# Patient Record
Sex: Male | Born: 1960
Health system: Southern US, Community
[De-identification: ages and names within clinical notes are randomized; demographics above are authoritative.]

## PROBLEM LIST (undated history)

## (undated) DIAGNOSIS — G4730 Sleep apnea, unspecified: Secondary | ICD-10-CM

## (undated) DIAGNOSIS — I10 Essential (primary) hypertension: Secondary | ICD-10-CM

## (undated) DIAGNOSIS — R351 Nocturia: Secondary | ICD-10-CM

## (undated) DIAGNOSIS — G4733 Obstructive sleep apnea (adult) (pediatric): Secondary | ICD-10-CM

## (undated) DIAGNOSIS — M1990 Unspecified osteoarthritis, unspecified site: Secondary | ICD-10-CM

## (undated) DIAGNOSIS — T7840XA Allergy, unspecified, initial encounter: Secondary | ICD-10-CM

## (undated) DIAGNOSIS — E785 Hyperlipidemia, unspecified: Secondary | ICD-10-CM

## (undated) DIAGNOSIS — I714 Abdominal aortic aneurysm, without rupture: Secondary | ICD-10-CM

## (undated) HISTORY — DX: Hyperlipidemia, unspecified: E78.5

## (undated) HISTORY — DX: Allergy, unspecified, initial encounter: T78.40XA

## (undated) HISTORY — PX: FOOT SURGERY: SHX648

## (undated) HISTORY — DX: Essential (primary) hypertension: I10

## (undated) HISTORY — DX: Sleep apnea, unspecified: G47.30

---

## 2000-01-13 HISTORY — PX: FOOT SURGERY: SHX648

## 2008-03-08 ENCOUNTER — Emergency Department (HOSPITAL_COMMUNITY): Admission: EM | Admit: 2008-03-08 | Discharge: 2008-03-09 | Payer: Self-pay | Admitting: Emergency Medicine

## 2008-03-13 ENCOUNTER — Ambulatory Visit: Payer: Self-pay | Admitting: Gastroenterology

## 2008-03-13 DIAGNOSIS — R109 Unspecified abdominal pain: Secondary | ICD-10-CM | POA: Insufficient documentation

## 2008-03-13 DIAGNOSIS — K921 Melena: Secondary | ICD-10-CM | POA: Insufficient documentation

## 2008-03-14 ENCOUNTER — Ambulatory Visit: Payer: Self-pay | Admitting: Gastroenterology

## 2008-03-14 ENCOUNTER — Encounter: Payer: Self-pay | Admitting: Gastroenterology

## 2008-03-14 LAB — HM COLONOSCOPY

## 2008-03-18 ENCOUNTER — Encounter: Payer: Self-pay | Admitting: Gastroenterology

## 2010-02-12 ENCOUNTER — Emergency Department (HOSPITAL_COMMUNITY)
Admission: EM | Admit: 2010-02-12 | Discharge: 2010-02-12 | Disposition: A | Discharge status: Home / Self Care | Payer: Self-pay | Attending: Emergency Medicine | Admitting: Emergency Medicine

## 2010-02-12 DIAGNOSIS — R0982 Postnasal drip: Secondary | ICD-10-CM | POA: Insufficient documentation

## 2010-02-12 DIAGNOSIS — R05 Cough: Secondary | ICD-10-CM | POA: Insufficient documentation

## 2010-02-12 DIAGNOSIS — J3489 Other specified disorders of nose and nasal sinuses: Secondary | ICD-10-CM | POA: Insufficient documentation

## 2010-02-12 DIAGNOSIS — I10 Essential (primary) hypertension: Secondary | ICD-10-CM | POA: Insufficient documentation

## 2010-02-12 DIAGNOSIS — R059 Cough, unspecified: Secondary | ICD-10-CM | POA: Insufficient documentation

## 2010-02-12 DIAGNOSIS — J069 Acute upper respiratory infection, unspecified: Secondary | ICD-10-CM | POA: Insufficient documentation

## 2010-02-12 DIAGNOSIS — J029 Acute pharyngitis, unspecified: Secondary | ICD-10-CM | POA: Insufficient documentation

## 2010-02-17 ENCOUNTER — Emergency Department (HOSPITAL_COMMUNITY)
Admission: EM | Admit: 2010-02-17 | Discharge: 2010-02-17 | Disposition: A | Discharge status: Home / Self Care | Payer: Self-pay | Attending: Emergency Medicine | Admitting: Emergency Medicine

## 2010-02-17 DIAGNOSIS — H9319 Tinnitus, unspecified ear: Secondary | ICD-10-CM | POA: Insufficient documentation

## 2010-02-17 DIAGNOSIS — I10 Essential (primary) hypertension: Secondary | ICD-10-CM | POA: Insufficient documentation

## 2010-02-17 DIAGNOSIS — R5383 Other fatigue: Secondary | ICD-10-CM | POA: Insufficient documentation

## 2010-02-17 DIAGNOSIS — J329 Chronic sinusitis, unspecified: Secondary | ICD-10-CM | POA: Insufficient documentation

## 2010-02-17 DIAGNOSIS — H9209 Otalgia, unspecified ear: Secondary | ICD-10-CM | POA: Insufficient documentation

## 2010-02-17 DIAGNOSIS — J3489 Other specified disorders of nose and nasal sinuses: Secondary | ICD-10-CM | POA: Insufficient documentation

## 2010-02-17 DIAGNOSIS — R059 Cough, unspecified: Secondary | ICD-10-CM | POA: Insufficient documentation

## 2010-02-17 DIAGNOSIS — R5381 Other malaise: Secondary | ICD-10-CM | POA: Insufficient documentation

## 2010-02-17 DIAGNOSIS — R0982 Postnasal drip: Secondary | ICD-10-CM | POA: Insufficient documentation

## 2010-02-17 DIAGNOSIS — R05 Cough: Secondary | ICD-10-CM | POA: Insufficient documentation

## 2010-02-19 ENCOUNTER — Encounter: Payer: Self-pay | Admitting: Internal Medicine

## 2010-02-25 ENCOUNTER — Ambulatory Visit (INDEPENDENT_AMBULATORY_CARE_PROVIDER_SITE_OTHER): Payer: Self-pay | Admitting: Internal Medicine

## 2010-02-25 ENCOUNTER — Encounter: Payer: Self-pay | Admitting: Internal Medicine

## 2010-02-25 DIAGNOSIS — R51 Headache: Secondary | ICD-10-CM

## 2010-02-25 DIAGNOSIS — I10 Essential (primary) hypertension: Secondary | ICD-10-CM

## 2010-02-25 DIAGNOSIS — Z79899 Other long term (current) drug therapy: Secondary | ICD-10-CM

## 2010-02-25 DIAGNOSIS — K219 Gastro-esophageal reflux disease without esophagitis: Secondary | ICD-10-CM

## 2010-02-25 DIAGNOSIS — R519 Headache, unspecified: Secondary | ICD-10-CM | POA: Insufficient documentation

## 2010-02-25 LAB — CBC WITH DIFFERENTIAL/PLATELET
Basophils Relative: 0.3 % (ref 0.0–3.0)
Eosinophils Relative: 1.5 % (ref 0.0–5.0)
Lymphocytes Relative: 25.6 % (ref 12.0–46.0)
Monocytes Absolute: 1 10*3/uL (ref 0.1–1.0)
Monocytes Relative: 7.5 % (ref 3.0–12.0)
Neutrophils Relative %: 65.1 % (ref 43.0–77.0)
Platelets: 261 10*3/uL (ref 150.0–400.0)
RBC: 5.01 Mil/uL (ref 4.22–5.81)
WBC: 13.4 10*3/uL — ABNORMAL HIGH (ref 4.5–10.5)

## 2010-02-25 LAB — BASIC METABOLIC PANEL
BUN: 15 mg/dL (ref 6–23)
Chloride: 102 mEq/L (ref 96–112)
GFR: 166.18 mL/min (ref >60.00)
Glucose, Bld: 71 mg/dL (ref 70–99)
Potassium: 4.3 mEq/L (ref 3.5–5.1)
Sodium: 141 mEq/L (ref 135–145)

## 2010-02-25 MED ORDER — AMLODIPINE BESYLATE 5 MG PO TABS: 5.0000 mg | ORAL_TABLET | Freq: Every day | ORAL | Status: DC

## 2010-02-25 MED ORDER — HYDROCHLOROTHIAZIDE 25 MG PO TABS: 25.0000 mg | ORAL_TABLET | Freq: Every day | ORAL | Status: DC

## 2010-02-25 MED ORDER — LEVOFLOXACIN 500 MG PO TABS: 500.0000 mg | ORAL_TABLET | Freq: Every day | ORAL | Status: DC

## 2010-02-25 NOTE — Assessment & Plan Note (Signed)
Suboptimal control. Presentation not c/w hypertensive urgency. Continue hctz and add norvasc 5mg  qd. Monitor bp daily at home and record for review. Obtain cbc and chem7. Followup closely in one week or sooner if necessary.

## 2010-02-25 NOTE — Assessment & Plan Note (Signed)
Agree sinusitis is a possible contributor to sx's. Intolerant of amoxicillin. Dc amox and begin levaquin. Followup scheduled.

## 2010-02-25 NOTE — Progress Notes (Signed)
  Subjective:    Patient ID: Benjamin Hines, male    DOB: 1960-06-21, 50 y.o.   MRN: [id number]  HPI Pt presents to clinic for evaluation of facial/head pain and elevated BP.  Notes one month h/o predominantly right sided facial pain (right brow and lateral to right side of nose) as well as right crown of head and posterior head pain. Pain is intermittent lasting ~45 mins and occurs at least 5 days at week. Sometime notes tender right temple but no acute visual changes. Seen at [facility] dx'ed with sinsusitis and presribed flonase and amoxicillin. Cannot tolerate amox due to gi upset.  H/o HTN previous tx'ed with norvasc and micardis hct but recently off all medications. Previously noted some degree of ED with medications. UC placed pt on hctz 25mg  qd. BP reviewed elevated and other than HA no associated sx. Also notes almost daily GERD sx not currently being tx'ed.  Reviewed PMH, PSH, medications, allergies, social hx and family hx.    Review of Systems  Constitutional: Positive for fatigue. Negative for fever, chills and activity change.  HENT: Positive for neck pain. Negative for congestion and facial swelling.   Eyes: Negative for pain, discharge, redness and visual disturbance.  Respiratory: Negative for cough, chest tightness and shortness of breath.   Cardiovascular: Negative for chest pain and palpitations.  Gastrointestinal: Negative for nausea, abdominal pain and blood in stool.  Genitourinary: Negative for frequency, decreased urine volume and difficulty urinating.  Musculoskeletal: Negative for back pain, joint swelling and gait problem.  Skin: Negative for color change and rash.  Neurological: Positive for numbness and headaches. Negative for dizziness, seizures and light-headedness.       [Occasional fingertip numbness of right hand. Bilateral UE strength 5/5 Hematological: Negative for adenopathy.  Psychiatric/Behavioral: Negative for behavioral problems and agitation. The patient is  not nervous/anxious.         Objective:   Physical Exam  Constitutional: He appears well-developed. No distress.  HENT:  Head: Normocephalic and atraumatic. Head is without contusion, without right periorbital erythema and without left periorbital erythema. Hair is normal.  Right Ear: External ear normal.  Left Ear: External ear normal.  Nose: Nose normal.  Mouth/Throat: Oropharynx is clear and moist.  Eyes: Conjunctivae are normal. Right eye exhibits no discharge. Left eye exhibits no discharge. No scleral icterus.  Neck: Neck supple. No JVD present. No spinous process tenderness and no muscular tenderness present. No rigidity. Normal range of motion present.  Cardiovascular: Normal rate, regular rhythm and normal heart sounds.  Exam reveals no gallop and no friction rub.   No murmur heard. Pulmonary/Chest: Effort normal and breath sounds normal. No respiratory distress. He has no wheezes. He has no rales.  Musculoskeletal:       Cervical back: He exhibits normal range of motion, no tenderness, no bony tenderness, no swelling and no deformity.       Bilateral UE strength 5/5  Lymphadenopathy:    He has no cervical adenopathy.  Neurological: He is alert. He has normal strength. Gait normal.  Skin: Skin is warm and dry. No bruising, no ecchymosis and no rash noted. He is not diaphoretic. No cyanosis. No pallor. Nails show no clubbing.  Psychiatric: He has a normal mood and affect. His speech is normal and behavior is normal.          Assessment & Plan:

## 2010-02-25 NOTE — Assessment & Plan Note (Signed)
Possible contribution from poorly controlled BP. Obtain ESR given temple tenderness

## 2010-02-25 NOTE — Assessment & Plan Note (Signed)
Begin otc omeprazole 20mg  qd and reassess at next visit.

## 2010-02-26 ENCOUNTER — Telehealth: Payer: Self-pay

## 2010-02-26 NOTE — Telephone Encounter (Signed)
Message copied by Kyung Rudd on Wed Feb 26, 2010  2:49 PM ------      Message from: Letitia Libra, Maisie Fus      Created: Wed Feb 26, 2010  1:25 PM       chem7 nl. Sl elevated wbc possibly related to infxn. Should be taking abx

## 2010-02-26 NOTE — Telephone Encounter (Signed)
Pt aware and verbalized understanding.  

## 2010-03-04 ENCOUNTER — Encounter: Payer: Self-pay | Admitting: Internal Medicine

## 2010-03-04 ENCOUNTER — Ambulatory Visit (INDEPENDENT_AMBULATORY_CARE_PROVIDER_SITE_OTHER): Payer: Self-pay | Admitting: Internal Medicine

## 2010-03-04 DIAGNOSIS — R519 Headache, unspecified: Secondary | ICD-10-CM

## 2010-03-04 DIAGNOSIS — R51 Headache: Secondary | ICD-10-CM

## 2010-03-04 DIAGNOSIS — I10 Essential (primary) hypertension: Secondary | ICD-10-CM

## 2010-03-04 DIAGNOSIS — K219 Gastro-esophageal reflux disease without esophagitis: Secondary | ICD-10-CM

## 2010-03-04 MED ORDER — LEVOFLOXACIN 500 MG PO TABS: 500.0000 mg | ORAL_TABLET | Freq: Every day | ORAL | Status: AC

## 2010-03-04 MED ORDER — AMLODIPINE BESYLATE 10 MG PO TABS: 10.0000 mg | ORAL_TABLET | Freq: Every day | ORAL | Status: DC

## 2010-03-04 MED ORDER — OMEPRAZOLE 40 MG PO CPDR: 40.0000 mg | DELAYED_RELEASE_CAPSULE | Freq: Every day | ORAL | Status: DC

## 2010-03-05 ENCOUNTER — Encounter: Payer: Self-pay | Admitting: Internal Medicine

## 2010-03-05 NOTE — Assessment & Plan Note (Signed)
Improving with abx tx for sinusitis. Extend abx course and followup if no resolution.

## 2010-03-05 NOTE — Progress Notes (Signed)
  Subjective:    Patient ID: Benjamin Hines, male    DOB: Apr 13, 1960, 50 y.o.   MRN: [id number]  HPI Pt presents to clinic for f/u of HTN and facial/head pain. Last visit norvasc 5mg  qd added to hctz with improvement of bp today. Home bp monitor continues to read higher than clinic values. Tolerating norvasc without LE edema. Tolerating levaquin for possible sinusitis and notes 50% improvement of facial and head pain. No f/c or purulent nasal drainage. Attempted prilosec otc qd for GERD sx's but has noted no significant improvement to date.   Reviewed PMH, medications and allergies.  Review of Systems  Constitutional: Negative for fever and chills.  HENT: Positive for congestion. Negative for facial swelling.   Eyes: Negative for pain, discharge and redness.       Objective:   Physical Exam  Constitutional: He appears well-developed and well-nourished. No distress.  HENT:  Head: Normocephalic and atraumatic.  Right Ear: External ear normal.  Left Ear: External ear normal.  Eyes: Conjunctivae are normal. No scleral icterus.  Neurological: He is alert.  Skin: Skin is warm and dry. He is not diaphoretic.          Assessment & Plan:

## 2010-03-05 NOTE — Assessment & Plan Note (Signed)
Improved but suboptimal control. Increase norvasc 10mg  po qd. Monitor bp as outpt and followup in clinic as scheduled. Bring bp monitor to next appt.

## 2010-03-05 NOTE — Assessment & Plan Note (Signed)
Suboptimal control with lose dose PPI. Change to omeprazole 40mg  po qd.

## 2010-03-26 ENCOUNTER — Other Ambulatory Visit (INDEPENDENT_AMBULATORY_CARE_PROVIDER_SITE_OTHER): Payer: Self-pay

## 2010-03-26 DIAGNOSIS — Z0000 Encounter for general adult medical examination without abnormal findings: Secondary | ICD-10-CM

## 2010-03-26 DIAGNOSIS — E785 Hyperlipidemia, unspecified: Secondary | ICD-10-CM

## 2010-03-26 LAB — CBC WITH DIFFERENTIAL/PLATELET
Basophils Relative: 0.3 % (ref 0.0–3.0)
Eosinophils Absolute: 0.1 10*3/uL (ref 0.0–0.7)
Eosinophils Relative: 1.1 % (ref 0.0–5.0)
HCT: 42.1 % (ref 39.0–52.0)
Lymphs Abs: 3.4 10*3/uL (ref 0.7–4.0)
MCHC: 33.5 g/dL (ref 30.0–36.0)
MCV: 89.9 fl (ref 78.0–100.0)
Monocytes Absolute: 0.7 10*3/uL (ref 0.1–1.0)
RBC: 4.68 Mil/uL (ref 4.22–5.81)
WBC: 11 10*3/uL — ABNORMAL HIGH (ref 4.5–10.5)

## 2010-03-26 LAB — BASIC METABOLIC PANEL
BUN: 13 mg/dL (ref 6–23)
CO2: 26 mEq/L (ref 19–32)
Chloride: 103 mEq/L (ref 96–112)
Creatinine, Ser: 0.8 mg/dL (ref 0.4–1.5)
Potassium: 3.9 mEq/L (ref 3.5–5.1)

## 2010-03-26 LAB — LIPID PANEL
Cholesterol: 205 mg/dL — ABNORMAL HIGH (ref 0–200)
HDL: 41.3 mg/dL (ref >39.00)
Triglycerides: 126 mg/dL (ref 0.0–149.0)

## 2010-03-26 LAB — LDL CHOLESTEROL, DIRECT: Direct LDL: 137.6 mg/dL

## 2010-03-26 LAB — POCT URINALYSIS DIPSTICK
Bilirubin, UA: NEGATIVE
Blood, UA: NEGATIVE
Ketones, UA: NEGATIVE
Nitrite, UA: NEGATIVE
Spec Grav, UA: 1.02
pH, UA: 7

## 2010-03-26 LAB — HEPATIC FUNCTION PANEL
ALT: 19 U/L (ref 0–53)
Albumin: 4.2 g/dL (ref 3.5–5.2)
Total Protein: 6.9 g/dL (ref 6.0–8.3)

## 2010-04-04 ENCOUNTER — Ambulatory Visit (INDEPENDENT_AMBULATORY_CARE_PROVIDER_SITE_OTHER): Payer: Self-pay | Admitting: Internal Medicine

## 2010-04-04 ENCOUNTER — Encounter: Payer: Self-pay | Admitting: Internal Medicine

## 2010-04-04 VITALS — BP 150/104 | HR 101 | Temp 98.2°F | Wt 179.0 lb

## 2010-04-04 DIAGNOSIS — Z0000 Encounter for general adult medical examination without abnormal findings: Secondary | ICD-10-CM

## 2010-04-04 DIAGNOSIS — I10 Essential (primary) hypertension: Secondary | ICD-10-CM

## 2010-04-04 DIAGNOSIS — F17200 Nicotine dependence, unspecified, uncomplicated: Secondary | ICD-10-CM

## 2010-04-04 MED ORDER — VARENICLINE TARTRATE 0.5 MG PO TABS: 0.5000 mg | ORAL_TABLET | Freq: Two times a day (BID) | ORAL | Status: AC

## 2010-04-04 MED ORDER — LOSARTAN POTASSIUM-HCTZ 50-12.5 MG PO TABS: 1.0000 | ORAL_TABLET | Freq: Every day | ORAL | Status: DC

## 2010-04-06 DIAGNOSIS — F17200 Nicotine dependence, unspecified, uncomplicated: Secondary | ICD-10-CM | POA: Insufficient documentation

## 2010-04-06 DIAGNOSIS — Z0000 Encounter for general adult medical examination without abnormal findings: Secondary | ICD-10-CM | POA: Insufficient documentation

## 2010-04-06 NOTE — Progress Notes (Signed)
  Subjective:    Patient ID: Benjamin Hines, male    DOB: [birth year], 50 y.o.   MRN: [id number]  HPI Pt presents to clinic for CPE. BP remains elevated despite addition of norvasc. Tolerating medication without adverse effect. Continues to smoke tobacco but interested in possible cessation assistance. No other complaints. Head pain resolved with tx for sinusitis.  Reviewed pmh, medications and allergies.    Review of Systems  Constitutional: Negative.   HENT: Negative.   Respiratory: Negative.   Cardiovascular: Negative.        Objective:   Physical Exam  Vitals reviewed. Constitutional: He appears well-developed and well-nourished. No distress.  HENT:  Head: Normocephalic and atraumatic.  Right Ear: Tympanic membrane, external ear and ear canal normal.  Left Ear: Tympanic membrane, external ear and ear canal normal.  Nose: Nose normal.  Mouth/Throat: Oropharynx is clear and moist. No oropharyngeal exudate.  Eyes: Conjunctivae and EOM are normal. Pupils are equal, round, and reactive to light. Right eye exhibits no discharge. Left eye exhibits no discharge. No scleral icterus.  Neck: Neck supple.  Cardiovascular: Normal rate, regular rhythm and normal heart sounds.  Exam reveals no gallop and no friction rub.   No murmur heard. Pulmonary/Chest: Effort normal and breath sounds normal. No respiratory distress. He has no wheezes. He has no rales.  Abdominal: Soft. Bowel sounds are normal. He exhibits no distension and no mass. There is no tenderness. There is no rebound and no guarding.  Genitourinary: Rectum normal and prostate normal. Rectal exam shows no mass. Guaiac negative stool.  Lymphadenopathy:    He has no cervical adenopathy.  Neurological: He is alert.  Skin: Skin is warm and dry. No rash noted. He is not diaphoretic. No erythema.  Psychiatric: He has a normal mood and affect.          Assessment & Plan:

## 2010-04-06 NOTE — Assessment & Plan Note (Signed)
Exam nl. Obtain screening labs.

## 2010-04-06 NOTE — Assessment & Plan Note (Signed)
Discussed and recommended need for cessation. Agrees to attempt chantix. Reviewed potential side effects.

## 2010-04-06 NOTE — Assessment & Plan Note (Signed)
Suboptimal control. Change hctz to hyzaar. Monitor bp as outpt and followup in clinic as scheduled.

## 2010-04-29 LAB — DIFFERENTIAL
Eosinophils Absolute: 0.2 10*3/uL (ref 0.0–0.7)
Lymphs Abs: 3.1 10*3/uL (ref 0.7–4.0)
Monocytes Absolute: 0.4 10*3/uL (ref 0.1–1.0)
Monocytes Relative: 3 % (ref 3–12)
Neutrophils Relative %: 68 % (ref 43–77)

## 2010-04-29 LAB — HEPATIC FUNCTION PANEL
ALT: 24 U/L (ref 0–53)
Albumin: 4.1 g/dL (ref 3.5–5.2)
Alkaline Phosphatase: 107 U/L (ref 39–117)
Total Protein: 6.8 g/dL (ref 6.0–8.3)

## 2010-04-29 LAB — BASIC METABOLIC PANEL
Chloride: 104 mEq/L (ref 96–112)
Creatinine, Ser: 0.87 mg/dL (ref 0.4–1.5)
GFR calc Af Amer: >60 mL/min (ref >60)
Potassium: 3.8 mEq/L (ref 3.5–5.1)
Sodium: 139 mEq/L (ref 135–145)

## 2010-04-29 LAB — CBC
Hemoglobin: 14.1 g/dL (ref 13.0–17.0)
MCV: 88.5 fL (ref 78.0–100.0)
RBC: 4.59 MIL/uL (ref 4.22–5.81)
WBC: 11.9 10*3/uL — ABNORMAL HIGH (ref 4.0–10.5)

## 2010-05-05 ENCOUNTER — Ambulatory Visit: Payer: Self-pay | Admitting: Internal Medicine

## 2010-06-03 ENCOUNTER — Ambulatory Visit: Payer: Self-pay | Admitting: Internal Medicine

## 2010-08-01 ENCOUNTER — Ambulatory Visit: Payer: Self-pay | Admitting: Internal Medicine

## 2010-09-03 ENCOUNTER — Ambulatory Visit: Payer: Self-pay | Admitting: Family Medicine

## 2010-09-29 ENCOUNTER — Ambulatory Visit: Payer: Self-pay | Admitting: Family Medicine

## 2010-10-29 ENCOUNTER — Ambulatory Visit: Payer: Self-pay | Admitting: Family Medicine

## 2010-11-10 ENCOUNTER — Ambulatory Visit: Payer: Self-pay | Admitting: Family Medicine

## 2010-12-12 ENCOUNTER — Ambulatory Visit: Payer: Self-pay | Admitting: Family Medicine

## 2011-02-06 ENCOUNTER — Ambulatory Visit: Payer: Self-pay | Admitting: Family Medicine

## 2011-04-01 ENCOUNTER — Ambulatory Visit: Payer: Self-pay | Admitting: Family Medicine

## 2012-09-29 ENCOUNTER — Ambulatory Visit (INDEPENDENT_AMBULATORY_CARE_PROVIDER_SITE_OTHER)
Admission: RE | Admit: 2012-09-29 | Discharge: 2012-09-29 | Disposition: A | Discharge status: Home / Self Care | Payer: BC Managed Care – PPO | Source: Ambulatory Visit | Attending: Family Medicine | Admitting: Family Medicine

## 2012-09-29 ENCOUNTER — Encounter: Payer: Self-pay | Admitting: Family Medicine

## 2012-09-29 ENCOUNTER — Ambulatory Visit (INDEPENDENT_AMBULATORY_CARE_PROVIDER_SITE_OTHER): Payer: BC Managed Care – PPO | Admitting: Family Medicine

## 2012-09-29 VITALS — BP 144/79 | HR 79 | Temp 97.8°F | Ht 69.0 in | Wt 164.0 lb

## 2012-09-29 DIAGNOSIS — R059 Cough, unspecified: Secondary | ICD-10-CM

## 2012-09-29 DIAGNOSIS — R05 Cough: Secondary | ICD-10-CM

## 2012-09-29 DIAGNOSIS — R5383 Other fatigue: Secondary | ICD-10-CM

## 2012-09-29 DIAGNOSIS — R631 Polydipsia: Secondary | ICD-10-CM

## 2012-09-29 DIAGNOSIS — R358 Other polyuria: Secondary | ICD-10-CM

## 2012-09-29 DIAGNOSIS — R3589 Other polyuria: Secondary | ICD-10-CM

## 2012-09-29 DIAGNOSIS — R634 Abnormal weight loss: Secondary | ICD-10-CM

## 2012-09-29 DIAGNOSIS — R5381 Other malaise: Secondary | ICD-10-CM

## 2012-09-29 LAB — POCT URINALYSIS DIPSTICK
Bilirubin, UA: NEGATIVE
Ketones, UA: NEGATIVE
Leukocytes, UA: NEGATIVE
Protein, UA: NEGATIVE

## 2012-09-29 LAB — BASIC METABOLIC PANEL WITH GFR
BUN: 10 mg/dL (ref 6–23)
CO2: 25 meq/L (ref 19–32)
Calcium: 9.1 mg/dL (ref 8.4–10.5)
Chloride: 106 meq/L (ref 96–112)
Creatinine, Ser: 0.7 mg/dL (ref 0.4–1.5)
GFR: 151.05 mL/min
Glucose, Bld: 72 mg/dL (ref 70–99)
Potassium: 4.1 meq/L (ref 3.5–5.1)
Sodium: 141 meq/L (ref 135–145)

## 2012-09-29 LAB — CBC WITH DIFFERENTIAL/PLATELET
Basophils Absolute: 0 K/uL (ref 0.0–0.1)
Basophils Relative: 0.4 % (ref 0.0–3.0)
Eosinophils Absolute: 0.2 K/uL (ref 0.0–0.7)
Eosinophils Relative: 1.4 % (ref 0.0–5.0)
HCT: 44 % (ref 39.0–52.0)
Hemoglobin: 14.7 g/dL (ref 13.0–17.0)
Lymphocytes Relative: 31.7 % (ref 12.0–46.0)
Lymphs Abs: 3.3 K/uL (ref 0.7–4.0)
MCHC: 33.5 g/dL (ref 30.0–36.0)
MCV: 89.4 fl (ref 78.0–100.0)
Monocytes Absolute: 0.5 K/uL (ref 0.1–1.0)
Monocytes Relative: 5.2 % (ref 3.0–12.0)
Neutro Abs: 6.4 K/uL (ref 1.4–7.7)
Neutrophils Relative %: 61.3 % (ref 43.0–77.0)
Platelets: 234 K/uL (ref 150.0–400.0)
RBC: 4.93 Mil/uL (ref 4.22–5.81)
RDW: 14.3 % (ref 11.5–14.6)
WBC: 10.4 K/uL (ref 4.5–10.5)

## 2012-09-29 LAB — TSH: TSH: 1.43 u[IU]/mL (ref 0.35–5.50)

## 2012-09-29 LAB — HEPATIC FUNCTION PANEL
ALT: 18 U/L (ref 0–53)
Total Bilirubin: 0.7 mg/dL (ref 0.3–1.2)

## 2012-09-29 NOTE — Progress Notes (Signed)
  Subjective:    Patient ID: Benjamin Hines, male    DOB: August 24, 1960, 52 y.o.   MRN: [id number]  HPI  Patient seen today for evaluation regarding nonspecific symptoms of weight loss and malaise. He states his weight has gone down about 15 pounds in the past year. Noticed increased urinary frequency some thirst. No history of diabetes. He also complained of some vague diffuse intermittent crampy pains lower abdomen. No stool changes. No fevers or chills. No dysuria other than occasional slow stream.  Patient had colonoscopy 2010. PSA 2 years ago normal.  Ongoing cigarette use.  He does complain of some dry cough for the past one month. No increased dyspnea. No hemoptysis. No pleuritic pain.  Past Medical History  Diagnosis Date  . Hypertension    Past Surgical History  Procedure Laterality Date  . Foot surgery      left foot    reports that he has been smoking.  He does not have any smokeless tobacco history on file. He reports that  drinks alcohol. He reports that he does not use illicit drugs. family history is not on file. No Known Allergies   Review of Systems  Constitutional: Positive for appetite change, fatigue and unexpected weight change. Negative for fever and chills.  HENT: Negative for trouble swallowing.   Respiratory: Positive for cough. Negative for shortness of breath and wheezing.   Cardiovascular: Negative for chest pain, palpitations and leg swelling.  Gastrointestinal: Negative for nausea, vomiting, abdominal pain, diarrhea and constipation.  Endocrine: Positive for polydipsia and polyuria.  Genitourinary: Negative for dysuria, hematuria and flank pain.  Skin: Negative for rash.  Neurological: Negative for headaches.  Psychiatric/Behavioral: Negative for confusion.       Objective:   Physical Exam  Constitutional: He appears well-developed and well-nourished.  HENT:  Right Ear: External ear normal.  Left Ear: External ear normal.  Mouth/Throat:  Oropharynx is clear and moist.  Neck: Neck supple. No thyromegaly present.  Cardiovascular: Normal rate and regular rhythm.   Pulmonary/Chest: Effort normal and breath sounds normal. No respiratory distress. He has no wheezes. He has no rales.  Abdominal: Soft. Bowel sounds are normal. He exhibits no distension and no mass. There is no tenderness. There is no rebound and no guarding.  Musculoskeletal: He exhibits no edema.  Lymphadenopathy:    He has no cervical adenopathy.  Skin: No rash noted.          Assessment & Plan:  Patient persists with nonspecific symptoms of malaise, weight loss, polyuria, polydipsia, and dry cough for one month. Rule out diabetes. Obtain screening labs with basic metabolic panel, hepatic panel, CBC, TSH, urine dipstick, chest x-ray

## 2012-10-04 ENCOUNTER — Ambulatory Visit: Payer: BC Managed Care – PPO | Admitting: Family Medicine

## 2012-10-05 ENCOUNTER — Encounter: Payer: Self-pay | Admitting: Family Medicine

## 2012-10-05 ENCOUNTER — Ambulatory Visit (INDEPENDENT_AMBULATORY_CARE_PROVIDER_SITE_OTHER): Payer: BC Managed Care – PPO | Admitting: Family Medicine

## 2012-10-05 VITALS — BP 142/88 | HR 84 | Temp 98.2°F | Wt 166.0 lb

## 2012-10-05 DIAGNOSIS — H6123 Impacted cerumen, bilateral: Secondary | ICD-10-CM

## 2012-10-05 DIAGNOSIS — H6120 Impacted cerumen, unspecified ear: Secondary | ICD-10-CM

## 2012-10-05 DIAGNOSIS — K5900 Constipation, unspecified: Secondary | ICD-10-CM

## 2012-10-05 DIAGNOSIS — N400 Enlarged prostate without lower urinary tract symptoms: Secondary | ICD-10-CM

## 2012-10-05 MED ORDER — TAMSULOSIN HCL 0.4 MG PO CAPS: 0.4000 mg | ORAL_CAPSULE | Freq: Every day | ORAL | Status: DC

## 2012-10-05 NOTE — Patient Instructions (Addendum)
Constipation, Adult Constipation is when a person has fewer than 3 bowel movements a week; has difficulty having a bowel movement; or has stools that are dry, hard, or larger than normal. As people grow older, constipation is more common. If you try to fix constipation with medicines that make you have a bowel movement (laxatives), the problem may get worse. Long-term laxative use may cause the muscles of the colon to become weak. A low-fiber diet, not taking in enough fluids, and taking certain medicines may make constipation worse. CAUSES   Certain medicines, such as antidepressants, pain medicine, iron supplements, antacids, and water pills.   Certain diseases, such as diabetes, irritable bowel syndrome (IBS), thyroid disease, or depression.   Not drinking enough water.   Not eating enough fiber-rich foods.   Stress or travel.  Lack of physical activity or exercise.  Not going to the restroom when there is the urge to have a bowel movement.  Ignoring the urge to have a bowel movement.  Using laxatives too much. SYMPTOMS   Having fewer than 3 bowel movements a week.   Straining to have a bowel movement.   Having hard, dry, or larger than normal stools.   Feeling full or bloated.   Pain in the lower abdomen.  Not feeling relief after having a bowel movement. DIAGNOSIS  Your caregiver will take a medical history and perform a physical exam. Further testing may be done for severe constipation. Some tests may include:   A barium enema X-ray to examine your rectum, colon, and sometimes, your small intestine.  A sigmoidoscopy to examine your lower colon.  A colonoscopy to examine your entire colon. TREATMENT  Treatment will depend on the severity of your constipation and what is causing it. Some dietary treatments include drinking more fluids and eating more fiber-rich foods. Lifestyle treatments may include regular exercise. If these diet and lifestyle recommendations  do not help, your caregiver may recommend taking over-the-counter laxative medicines to help you have bowel movements. Prescription medicines may be prescribed if over-the-counter medicines do not work.  HOME CARE INSTRUCTIONS   Increase dietary fiber in your diet, such as fruits, vegetables, whole grains, and beans. Limit high-fat and processed sugars in your diet, such as [place] fries, hamburgers, cookies, candies, and soda.   A fiber supplement may be added to your diet if you cannot get enough fiber from foods.   Drink enough fluids to keep your urine clear or pale yellow.   Exercise regularly or as directed by your caregiver.   Go to the restroom when you have the urge to go. Do not hold it.  Only take medicines as directed by your caregiver. Do not take other medicines for constipation without talking to your caregiver first. SEEK IMMEDIATE MEDICAL CARE IF:   You have bright red blood in your stool.   Your constipation lasts for more than 4 days or gets worse.   You have abdominal or rectal pain.   You have thin, pencil-like stools.  You have unexplained weight loss. MAKE SURE YOU:   Understand these instructions.  Will watch your condition.  Will get help right away if you are not doing well or get worse. Document Released: 09/27/2003 Document Revised: 03/23/2011 Document Reviewed: 12/02/2010 [facility] Patient Information 2014 [place], [place].  Follow up in 2-3 weeks if urine symptoms not improved.

## 2012-10-05 NOTE — Progress Notes (Signed)
  Subjective:    Patient ID: Benjamin Hines, male    DOB: 09/15/1960, 52 y.o.   MRN: [id number]  HPI Patient seen for followup for multiple issues as below Recent complaints of weight loss. We obtained several labs including chest x-ray these were all unremarkable.  He complains of some constipation.  Has stools most days though these are frequently hard. Generally tries to eat good fiber and fluid intake appears to be adequate. No recent change in stools otherwise. No bloody stools. Previous colonoscopy 2010. Recent TSH normal. No abdominal pain  Wax build up bilaterally. He's noticed a fullness in the ears. No drainage. No ear pain. No dizziness.  Patient complains of slow urine stream. He has some daytime frequency. Minimal moderate nocturia. Sometimes feels he has difficulty emptying bladder fully. Last PSA 2 years ago normal. No anticholinergic use  Past Medical History  Diagnosis Date  . Hypertension    Past Surgical History  Procedure Laterality Date  . Foot surgery      left foot    reports that he has been smoking.  He does not have any smokeless tobacco history on file. He reports that  drinks alcohol. He reports that he does not use illicit drugs. family history is not on file. No Known Allergies    Review of Systems  Constitutional: Negative for fever, chills, appetite change, fatigue and unexpected weight change.  HENT: Negative for ear pain.   Respiratory: Negative for cough and shortness of breath.   Cardiovascular: Negative for chest pain and leg swelling.  Gastrointestinal: Positive for constipation. Negative for nausea, vomiting and abdominal pain.  Endocrine: Negative for polydipsia and polyuria.  Genitourinary: Positive for decreased urine volume and difficulty urinating. Negative for flank pain.  Musculoskeletal: Negative for back pain.  Hematological: Negative for adenopathy.       Objective:   Physical Exam  Constitutional: He appears well-developed  and well-nourished.  HENT:  Mouth/Throat: Oropharynx is clear and moist.  Patient has cerumen in both ear canals. This was removed with curette  Neck: Neck supple.  Cardiovascular: Normal rate.   Pulmonary/Chest: Effort normal and breath sounds normal. No respiratory distress. He has no wheezes. He has no rales.  Genitourinary:  Rectal exam reveals prostate is relatively normal size. Symmetric with no nodules palpated. No rectal mass.          Assessment & Plan:  #1 constipation. We discussed measures to help reduce. Consider stool softener if not responding to fiber and fluids #2 bilateral cerumen impaction. Removed with curette bilaterally #3 urine frequency. Suspect mild BPH. We discussed options. Follow Flomax 0.5 mg each bedtime. Touch base in 2-3 weeks if not improved.  Recommend repeat PSA if symptoms not promptly improving with the above #4 hypertension. Borderline control. Continue close monitoring. We discussed appropriate diet and sodium reduction

## 2012-10-06 ENCOUNTER — Telehealth: Payer: Self-pay | Admitting: Family Medicine

## 2012-10-06 MED ORDER — TAMSULOSIN HCL 0.4 MG PO CAPS: 0.4000 mg | ORAL_CAPSULE | Freq: Every day | ORAL | Status: DC

## 2012-10-06 NOTE — Telephone Encounter (Signed)
RX sent to pharmacy. Pt informed.

## 2012-10-06 NOTE — Telephone Encounter (Signed)
Patient was seen yesterday. [facility] states they never received the Flomax rx - please resend to [facility]. Also, pt thought Dr. Caryl Never was going to send in a stool softener. Please do so if ok. Also, patient requesting call back to know it is done.

## 2012-10-06 NOTE — Telephone Encounter (Signed)
Were you going to send the a stool softener in to the pharmacy?

## 2012-10-06 NOTE — Telephone Encounter (Signed)
Stool softeners are OTC.  Re-send Flomax 0.4 mg one daily #30 with 5 refills

## 2012-12-06 ENCOUNTER — Encounter: Payer: Self-pay | Admitting: Family Medicine

## 2012-12-06 ENCOUNTER — Ambulatory Visit (INDEPENDENT_AMBULATORY_CARE_PROVIDER_SITE_OTHER): Payer: BC Managed Care – PPO | Admitting: Family Medicine

## 2012-12-06 VITALS — BP 180/124 | Temp 98.6°F | Wt 168.0 lb

## 2012-12-06 DIAGNOSIS — J069 Acute upper respiratory infection, unspecified: Secondary | ICD-10-CM

## 2012-12-06 DIAGNOSIS — I10 Essential (primary) hypertension: Secondary | ICD-10-CM

## 2012-12-06 MED ORDER — HYDROCODONE-HOMATROPINE 5-1.5 MG/5ML PO SYRP: 5.0000 mL | ORAL_SOLUTION | Freq: Four times a day (QID) | ORAL | Status: AC | PRN

## 2012-12-06 MED ORDER — AMLODIPINE BESYLATE 10 MG PO TABS: 10.0000 mg | ORAL_TABLET | Freq: Every day | ORAL | Status: DC

## 2012-12-06 NOTE — Progress Notes (Signed)
  Subjective:    Patient ID: Benjamin Hines, male    DOB: 03/30/1960, 52 y.o.   MRN: [id number]  HPI Patient seen for acute visit of nasal congestion for 3-4 days, cough, and left ear pain. No fever. Some increased malaise. Mild body aches. No nausea or vomiting. Denies any sore throat. Left ear fullness and possibly some mild pressure changes but no drainage  Patient's hypertension but apparently is not taking either of his blood pressure medications. He states he simply did not get these refilled last time.  Denies any recent chest pains or dizziness. Not monitoring blood pressures at home. No alcohol use. Has taken some recent non steroidals.  Past Medical History  Diagnosis Date  . Hypertension    Past Surgical History  Procedure Laterality Date  . Foot surgery      left foot    reports that he has been smoking.  He does not have any smokeless tobacco history on file. He reports that he drinks alcohol. He reports that he does not use illicit drugs. family history is not on file. No Known Allergies   Review of Systems  Constitutional: Negative for fatigue.  HENT: Positive for congestion and rhinorrhea.   Eyes: Negative for visual disturbance.  Respiratory: Positive for cough. Negative for chest tightness and shortness of breath.   Cardiovascular: Negative for chest pain, palpitations and leg swelling.  Neurological: Positive for headaches. Negative for dizziness, syncope, weakness and light-headedness.       Objective:   Physical Exam  Constitutional: He is oriented to person, place, and time. He appears well-developed and well-nourished.  HENT:  Right Ear: External ear normal.  Left Ear: External ear normal.  Mouth/Throat: Oropharynx is clear and moist.  Eyes: Pupils are equal, round, and reactive to light.  Neck: Neck supple. No thyromegaly present.  Cardiovascular: Normal rate and regular rhythm.   Pulmonary/Chest: Effort normal and breath sounds normal. No  respiratory distress. He has no wheezes. He has no rales.  Musculoskeletal: He exhibits no edema.  Neurological: He is alert and oriented to person, place, and time.          Assessment & Plan:  #1 viral URI. Reassurance. Avoid decongestants. Avoid nonsteroidals. Hycodan cough syrup for nighttime cough as needed. #2 severe hypertension. Patient currently not taking blood pressure medications. We talked about seriousness of not treating. Avoid nonsteroidals. He does not use alcohol. Avoid decongestants. Start back amlodipine 10 mg once daily. Reassess blood pressure 2 weeks. Reduce sodium intake. Consider additional medications that point if not controlled

## 2012-12-06 NOTE — Patient Instructions (Signed)
Get back on blood pressure medication immediately. Avoid any decongestants and also avoid Advil and Aleve which can raise blood pressure. Tylenol is OK.

## 2012-12-06 NOTE — Progress Notes (Signed)
Pre visit review using our clinic review tool, if applicable. No additional management support is needed unless otherwise documented below in the visit note. 

## 2013-02-06 ENCOUNTER — Telehealth: Payer: Self-pay | Admitting: Family Medicine

## 2013-02-06 ENCOUNTER — Ambulatory Visit: Payer: BC Managed Care – PPO | Admitting: Family Medicine

## 2013-02-06 NOTE — Telephone Encounter (Signed)
Patient Information:  Caller Name: [place]  Phone: [phone]  Patient: Benjamin, Hines  Gender: Male  DOB: [birth year]  Age: 53 Years  PCP: Carolann Littler [facility])  Office Follow Up:  Does the office need to follow up with this patient?: No  Instructions For The Office: N/A  RN Note:  Home care advice and call back information given  Symptoms  Reason For Call & Symptoms: Pt states that he has cold sxs.  Body aches, Afebrile.  Nasal congestion.  Coughing.  Reviewed Health History In EMR: Yes  Reviewed Medications In EMR: Yes  Reviewed Allergies In EMR: Yes  Reviewed Surgeries / Procedures: Yes  Date of Onset of Symptoms: 02/05/2013  Guideline(s) Used:  Influenza - Seasonal  Disposition Per Guideline:   Discuss with PCP and Callback by Nurse Today  Reason For Disposition Reached:   Patient requests antiviral medicine for influenza and flu symptoms present < 48 hours  Advice Given:  Treating the Symptoms of Flu  Fever, Muscle Aches, and Headache: For fever more than 101 F (38.3 C), muscle aches, and headaches, take acetaminophen every 4-6 hours (Adults 650 mg) OR ibuprofen every 6-8 hours (Adults 400-600 mg).  Treating the Symptoms of Flu  Fever, Muscle Aches, and Headache: For fever more than 101 F (38.3 C), muscle aches, and headaches, take acetaminophen every 4-6 hours (Adults 650 mg) OR ibuprofen every 6-8 hours (Adults 400-600 mg).  Hydrate: Drink extra liquids. If the air in your home is dry, use a humidifier.  Call Back If:  Fever lasts more than 3 days  Runny nose lasts more than 10 days  Cough lasts more than 3 weeks  You become short of breath or worse.  Patient Will Follow Care Advice:  YES

## 2013-02-06 NOTE — Telephone Encounter (Signed)
Pt needs office follow up.  He had SEVERE hypertension in November and was supposed to have 2 weeks follow up.  His symptoms sound viral and treatment is symptomatic.

## 2013-02-06 NOTE — Telephone Encounter (Signed)
Last visit 12/06/12

## 2013-02-06 NOTE — Telephone Encounter (Signed)
Pt informed and wants to make appt to be seen in Feb.

## 2013-02-21 ENCOUNTER — Ambulatory Visit: Payer: BC Managed Care – PPO | Admitting: Family Medicine

## 2013-02-23 ENCOUNTER — Ambulatory Visit (INDEPENDENT_AMBULATORY_CARE_PROVIDER_SITE_OTHER): Payer: BC Managed Care – PPO | Admitting: Family Medicine

## 2013-02-23 ENCOUNTER — Encounter: Payer: Self-pay | Admitting: Family Medicine

## 2013-02-23 VITALS — BP 132/90 | HR 90 | Temp 97.9°F | Wt 167.0 lb

## 2013-02-23 DIAGNOSIS — F17200 Nicotine dependence, unspecified, uncomplicated: Secondary | ICD-10-CM

## 2013-02-23 DIAGNOSIS — J309 Allergic rhinitis, unspecified: Secondary | ICD-10-CM

## 2013-02-23 DIAGNOSIS — I10 Essential (primary) hypertension: Secondary | ICD-10-CM

## 2013-02-23 DIAGNOSIS — Z559 Problems related to education and literacy, unspecified: Secondary | ICD-10-CM

## 2013-02-23 MED ORDER — HYDROCHLOROTHIAZIDE 12.5 MG PO CAPS: 12.5000 mg | ORAL_CAPSULE | Freq: Every day | ORAL | Status: DC

## 2013-02-23 NOTE — Progress Notes (Signed)
Pre visit review using our clinic review tool, if applicable. No additional management support is needed unless otherwise documented below in the visit note. 

## 2013-02-23 NOTE — Patient Instructions (Signed)
Schedule complete physical Consider Allegra, claritan, or Zyrtec for allergy symptoms. May use occasional Aleve for arthritis symptoms but avoid regular use.

## 2013-02-23 NOTE — Progress Notes (Signed)
   Subjective:    Patient ID: Benjamin Hines, male    DOB: 11/30/1960, 53 y.o.   MRN: [id number]  Cough Pertinent negatives include no chest pain, headaches, sore throat or shortness of breath.   Patient here for several issues  Hypertension. We added back amlodipine for severe elevated blood pressure several months ago. He is taking this consistently. We had recommended two-week followup he is just now getting back. Denies any edema or headaches. Not monitoring blood pressures. No chest pains. No dizziness.  Recent cold-like symptoms with nasal congestion and cough. Onset 2 weeks ago. Those symptoms are improving. He does complain of some chronic rhinitis prior to his cold and is asking what he can take. He has not tried any antihistamines. He is still smoking but has transitioned to electronic cigarettes and is trying to taper back  He has history of BPH and currently takes Flomax. Symptoms are relatively stable. He has not had a complete physical a few years  Past Medical History  Diagnosis Date  . Hypertension    Past Surgical History  Procedure Laterality Date  . Foot surgery      left foot    reports that he has been smoking.  He does not have any smokeless tobacco history on file. He reports that he drinks alcohol. He reports that he does not use illicit drugs. family history is not on file. No Known Allergies   Review of Systems  Constitutional: Negative for fatigue.  HENT: Positive for congestion. Negative for sore throat.   Eyes: Negative for visual disturbance.  Respiratory: Positive for cough. Negative for chest tightness and shortness of breath.   Cardiovascular: Negative for chest pain, palpitations and leg swelling.  Endocrine: Negative for polydipsia and polyuria.  Neurological: Negative for dizziness, syncope, weakness, light-headedness and headaches.       Objective:   Physical Exam  Constitutional: He appears well-developed and well-nourished.  HENT:    Right Ear: External ear normal.  Left Ear: External ear normal.  Mouth/Throat: Oropharynx is clear and moist.  Neck: Neck supple. No thyromegaly present.  Cardiovascular: Normal rate and regular rhythm.   Pulmonary/Chest: Effort normal and breath sounds normal. No respiratory distress. He has no wheezes. He has no rales.  Musculoskeletal: He exhibits no edema.          Assessment & Plan:  #1 hypertension. Suboptimally controlled. By my reading right arm seated at rest 148/105 and left arm seated 146/100. Add HCTZ 12.5 mg daily and continue amlodipine and reassess blood pressure one month #2 allergic rhinitis. Try plain antihistamine without decongestant such as Allegra or Claritin #3 BPH symptomatically stable on Flomax

## 2013-02-24 ENCOUNTER — Telehealth: Payer: Self-pay | Admitting: Family Medicine

## 2013-02-24 NOTE — Telephone Encounter (Signed)
Relevant patient education mailed to patient.  

## 2013-03-22 ENCOUNTER — Ambulatory Visit (INDEPENDENT_AMBULATORY_CARE_PROVIDER_SITE_OTHER): Payer: BC Managed Care – PPO | Admitting: Family Medicine

## 2013-03-22 ENCOUNTER — Encounter: Payer: Self-pay | Admitting: Family Medicine

## 2013-03-22 ENCOUNTER — Telehealth: Payer: Self-pay | Admitting: Family Medicine

## 2013-03-22 VITALS — BP 162/112 | HR 85 | Wt 167.0 lb

## 2013-03-22 DIAGNOSIS — I10 Essential (primary) hypertension: Secondary | ICD-10-CM

## 2013-03-22 MED ORDER — LOSARTAN POTASSIUM-HCTZ 50-12.5 MG PO TABS: 1.0000 | ORAL_TABLET | Freq: Every day | ORAL | Status: DC

## 2013-03-22 NOTE — Patient Instructions (Signed)
Stop HCTZ. Continue with amlodipine Start Losartan -hctz one daily

## 2013-03-22 NOTE — Telephone Encounter (Signed)
Relevant patient education mailed to patient.  

## 2013-03-22 NOTE — Progress Notes (Signed)
Pre visit review using our clinic review tool, if applicable. No additional management support is needed unless otherwise documented below in the visit note. 

## 2013-03-22 NOTE — Progress Notes (Signed)
   Subjective:    Patient ID: Benjamin Hines, male    DOB: 1960-08-16, 53 y.o.   MRN: [id number]  HPI  Hypertension. Recent poor control. He takes amlodipine 10 mg daily. We added HCTZ 12.5 mg. However, he has been taking is inconsistently. He had some myalgias which he attributed to the HCTZ. No skin rash. Patient has been taking this no inconsistently. No headaches. No dizziness. No chest pains. No alcohol use.  Past Medical History  Diagnosis Date  . Hypertension    Past Surgical History  Procedure Laterality Date  . Foot surgery      left foot    reports that he has been smoking.  He does not have any smokeless tobacco history on file. He reports that he drinks alcohol. He reports that he does not use illicit drugs. family history is not on file. No Known Allergies   Review of Systems  Constitutional: Negative for fatigue and unexpected weight change.  Eyes: Negative for visual disturbance.  Respiratory: Negative for cough, chest tightness and shortness of breath.   Cardiovascular: Negative for chest pain, palpitations and leg swelling.  Neurological: Negative for dizziness, syncope, weakness, light-headedness and headaches.       Objective:   Physical Exam  Constitutional: He appears well-developed and well-nourished.  Cardiovascular: Normal rate.   Pulmonary/Chest: Effort normal and breath sounds normal. No respiratory distress. He has no wheezes. He has no rales.  Musculoskeletal: He exhibits no edema.          Assessment & Plan:  Hypertension. Poorly controlled. Poor compliance with HCTZ. We explained myalgias would be very unlikely. He's not describing any leg cramps. Continue amlodipine 10 mg daily. Start losartan HCTZ 50/12.5 mg 1 daily. Reassess one month.  Be in touch if any further myalgias

## 2013-04-26 ENCOUNTER — Ambulatory Visit (INDEPENDENT_AMBULATORY_CARE_PROVIDER_SITE_OTHER): Payer: BC Managed Care – PPO | Admitting: Family Medicine

## 2013-04-26 ENCOUNTER — Encounter: Payer: Self-pay | Admitting: Family Medicine

## 2013-04-26 VITALS — BP 140/88 | HR 91 | Temp 98.3°F | Ht 69.0 in | Wt 168.0 lb

## 2013-04-26 DIAGNOSIS — Z23 Encounter for immunization: Secondary | ICD-10-CM

## 2013-04-26 DIAGNOSIS — Z0000 Encounter for general adult medical examination without abnormal findings: Secondary | ICD-10-CM

## 2013-04-26 LAB — LIPID PANEL
CHOL/HDL RATIO: 4
Cholesterol: 179 mg/dL (ref 0–200)
HDL: 40.3 mg/dL (ref >39.00)
LDL Cholesterol: 121 mg/dL — ABNORMAL HIGH (ref 0–99)
TRIGLYCERIDES: 88 mg/dL (ref 0.0–149.0)
VLDL: 17.6 mg/dL (ref 0.0–40.0)

## 2013-04-26 LAB — CBC WITH DIFFERENTIAL/PLATELET
BASOS PCT: 0.3 % (ref 0.0–3.0)
Basophils Absolute: 0 10*3/uL (ref 0.0–0.1)
EOS PCT: 1.1 % (ref 0.0–5.0)
Eosinophils Absolute: 0.1 10*3/uL (ref 0.0–0.7)
HEMATOCRIT: 41.8 % (ref 39.0–52.0)
HEMOGLOBIN: 13.8 g/dL (ref 13.0–17.0)
LYMPHS ABS: 2.9 10*3/uL (ref 0.7–4.0)
Lymphocytes Relative: 26.1 % (ref 12.0–46.0)
MCHC: 33 g/dL (ref 30.0–36.0)
MCV: 89.6 fl (ref 78.0–100.0)
MONOS PCT: 5.2 % (ref 3.0–12.0)
Monocytes Absolute: 0.6 10*3/uL (ref 0.1–1.0)
NEUTROS ABS: 7.5 10*3/uL (ref 1.4–7.7)
Neutrophils Relative %: 67.3 % (ref 43.0–77.0)
Platelets: 255 10*3/uL (ref 150.0–400.0)
RBC: 4.66 Mil/uL (ref 4.22–5.81)
RDW: 14.5 % (ref 11.5–14.6)
WBC: 11.1 10*3/uL — AB (ref 4.5–10.5)

## 2013-04-26 LAB — POCT URINALYSIS DIPSTICK
Bilirubin, UA: NEGATIVE
GLUCOSE UA: NEGATIVE
KETONES UA: NEGATIVE
Leukocytes, UA: NEGATIVE
Nitrite, UA: NEGATIVE
PROTEIN UA: NEGATIVE
RBC UA: NEGATIVE
SPEC GRAV UA: 1.02
UROBILINOGEN UA: 0.2
pH, UA: 6.5

## 2013-04-26 LAB — BASIC METABOLIC PANEL
BUN: 10 mg/dL (ref 6–23)
CALCIUM: 9.3 mg/dL (ref 8.4–10.5)
CO2: 27 meq/L (ref 19–32)
CREATININE: 0.7 mg/dL (ref 0.4–1.5)
Chloride: 105 mEq/L (ref 96–112)
GFR: 145.91 mL/min (ref >60.00)
GLUCOSE: 82 mg/dL (ref 70–99)
Potassium: 4.9 mEq/L (ref 3.5–5.1)
Sodium: 140 mEq/L (ref 135–145)

## 2013-04-26 LAB — HEPATIC FUNCTION PANEL
ALBUMIN: 4 g/dL (ref 3.5–5.2)
ALK PHOS: 102 U/L (ref 39–117)
ALT: 17 U/L (ref 0–53)
AST: 22 U/L (ref 0–37)
BILIRUBIN DIRECT: 0.1 mg/dL (ref 0.0–0.3)
TOTAL PROTEIN: 7 g/dL (ref 6.0–8.3)
Total Bilirubin: 0.6 mg/dL (ref 0.3–1.2)

## 2013-04-26 LAB — TSH: TSH: 1.26 u[IU]/mL (ref 0.35–5.50)

## 2013-04-26 LAB — PSA: PSA: 1.03 ng/mL (ref 0.10–4.00)

## 2013-04-26 MED ORDER — VARENICLINE TARTRATE 1 MG PO TABS: 1.0000 mg | ORAL_TABLET | Freq: Two times a day (BID) | ORAL | Status: DC

## 2013-04-26 MED ORDER — VARENICLINE TARTRATE 0.5 MG X 11 & 1 MG X 42 PO MISC: ORAL | Status: DC

## 2013-04-26 NOTE — Progress Notes (Signed)
   Subjective:    Patient ID: Benjamin Hines, male    DOB: Feb 24, 1960, 53 y.o.   MRN: [id number]  HPI Patient here for complete physical. He has hypertension which has been poorly controlled recently. We have prescribed losartan HCTZ and he never got this filled as he states the pharmacy did not have that medication. He is taking amlodipine regularly and also HCTZ plain 12.5 mg once daily. Last tetanus unknown. He has history of occasional GERD. Ongoing nicotine use approximately one pack cigarettes per day. He does desire to quit.  Family history and social history reviewed and as below: He is married. He has no children of his own but a couple of stepchildren and one grandchild. Works full-time  Past Medical History  Diagnosis Date  . Hypertension    Past Surgical History  Procedure Laterality Date  . Foot surgery      left foot    reports that he has been smoking.  He does not have any smokeless tobacco history on file. He reports that he drinks alcohol. He reports that he does not use illicit drugs. family history is not on file. No Known Allergies    Review of Systems  Constitutional: Positive for fatigue. Negative for fever, activity change and appetite change.  HENT: Negative for congestion, ear pain and trouble swallowing.   Eyes: Negative for pain and visual disturbance.  Respiratory: Negative for cough, shortness of breath and wheezing.   Cardiovascular: Negative for chest pain and palpitations.  Gastrointestinal: Negative for nausea, vomiting, abdominal pain, diarrhea, constipation, blood in stool, abdominal distention and rectal pain.  Endocrine: Negative for polydipsia and polyuria.  Genitourinary: Negative for dysuria, hematuria and testicular pain.  Musculoskeletal: Negative for arthralgias and joint swelling.  Skin: Negative for rash.  Neurological: Negative for dizziness, syncope and headaches.  Hematological: Negative for adenopathy.  Psychiatric/Behavioral:  Negative for confusion and dysphoric mood.       Objective:   Physical Exam  Constitutional: He is oriented to person, place, and time. He appears well-developed and well-nourished. No distress.  HENT:  Head: Normocephalic and atraumatic.  Right Ear: External ear normal.  Left Ear: External ear normal.  Mouth/Throat: Oropharynx is clear and moist.  Eyes: Conjunctivae and EOM are normal. Pupils are equal, round, and reactive to light.  Neck: Normal range of motion. Neck supple. No thyromegaly present.  Cardiovascular: Normal rate, regular rhythm and normal heart sounds.   No murmur heard. Pulmonary/Chest: No respiratory distress. He has no wheezes. He has no rales.  Abdominal: Soft. Bowel sounds are normal. He exhibits no distension and no mass. There is no tenderness. There is no rebound and no guarding.  Genitourinary: Rectum normal and prostate normal.  Musculoskeletal: He exhibits no edema.  Lymphadenopathy:    He has no cervical adenopathy.  Neurological: He is alert and oriented to person, place, and time. He displays normal reflexes. No cranial nerve deficit.  Skin: No rash noted.  Psychiatric: He has a normal mood and affect.          Assessment & Plan:  Complete physical. Obtain screening lab work. Tetanus booster given. Colonoscopy up to date. Smoking cessation discussed at some length. Start Chantix after discussing risk and benefits.

## 2013-04-26 NOTE — Progress Notes (Signed)
Pre visit review using our clinic review tool, if applicable. No additional management support is needed unless otherwise documented below in the visit note. 

## 2013-04-26 NOTE — Patient Instructions (Signed)
Smoking Cessation Quitting smoking is important to your health and has many advantages. However, it is not always easy to quit since nicotine is a very addictive drug. Often times, people try 3 times or more before being able to quit. This document explains the best ways for you to prepare to quit smoking. Quitting takes hard work and a lot of effort, but you can do it. ADVANTAGES OF QUITTING SMOKING  You will live longer, feel better, and live better.  Your body will feel the impact of quitting smoking almost immediately.  Within 20 minutes, blood pressure decreases. Your pulse returns to its normal level.  After 8 hours, carbon monoxide levels in the blood return to normal. Your oxygen level increases.  After 24 hours, the chance of having a heart attack starts to decrease. Your breath, hair, and body stop smelling like smoke.  After 48 hours, damaged nerve endings begin to recover. Your sense of taste and smell improve.  After 72 hours, the body is virtually free of nicotine. Your bronchial tubes relax and breathing becomes easier.  After 2 to 12 weeks, lungs can hold more air. Exercise becomes easier and circulation improves.  The risk of having a heart attack, stroke, cancer, or lung disease is greatly reduced.  After 1 year, the risk of coronary heart disease is cut in half.  After 5 years, the risk of stroke falls to the same as a nonsmoker.  After 10 years, the risk of lung cancer is cut in half and the risk of other cancers decreases significantly.  After 15 years, the risk of coronary heart disease drops, usually to the level of a nonsmoker.  If you are pregnant, quitting smoking will improve your chances of having a healthy baby.  The people you live with, especially any children, will be healthier.  You will have extra money to spend on things other than cigarettes. QUESTIONS TO THINK ABOUT BEFORE ATTEMPTING TO QUIT You may want to talk about your answers with your  caregiver.  Why do you want to quit?  If you tried to quit in the past, what helped and what did not?  What will be the most difficult situations for you after you quit? How will you plan to handle them?  Who can help you through the tough times? Your family? Friends? A caregiver?  What pleasures do you get from smoking? What ways can you still get pleasure if you quit? Here are some questions to ask your caregiver:  How can you help me to be successful at quitting?  What medicine do you think would be best for me and how should I take it?  What should I do if I need more help?  What is smoking withdrawal like? How can I get information on withdrawal? GET READY  Set a quit date.  Change your environment by getting rid of all cigarettes, ashtrays, matches, and lighters in your home, car, or work. Do not let people smoke in your home.  Review your past attempts to quit. Think about what worked and what did not. GET SUPPORT AND ENCOURAGEMENT You have a better chance of being successful if you have help. You can get support in many ways.  Tell your family, friends, and co-workers that you are going to quit and need their support. Ask them not to smoke around you.  Get individual, group, or telephone counseling and support. Programs are available at local hospitals and health centers. Call your local health department for   information about programs in your area.  Spiritual beliefs and practices may help some smokers quit.  Download a "quit meter" on your computer to keep track of quit statistics, such as how long you have gone without smoking, cigarettes not smoked, and money saved.  Get a self-help book about quitting smoking and staying off of tobacco. LEARN NEW SKILLS AND BEHAVIORS  Distract yourself from urges to smoke. Talk to someone, go for a walk, or occupy your time with a task.  Change your normal routine. Take a different route to work. Drink tea instead of coffee.  Eat breakfast in a different place.  Reduce your stress. Take a hot bath, exercise, or read a book.  Plan something enjoyable to do every day. Reward yourself for not smoking.  Explore interactive web-based programs that specialize in helping you quit. GET MEDICINE AND USE IT CORRECTLY Medicines can help you stop smoking and decrease the urge to smoke. Combining medicine with the above behavioral methods and support can greatly increase your chances of successfully quitting smoking.  Nicotine replacement therapy helps deliver nicotine to your body without the negative effects and risks of smoking. Nicotine replacement therapy includes nicotine gum, lozenges, inhalers, nasal sprays, and skin patches. Some may be available over-the-counter and others require a prescription.  Antidepressant medicine helps people abstain from smoking, but how this works is unknown. This medicine is available by prescription.  Nicotinic receptor partial agonist medicine simulates the effect of nicotine in your brain. This medicine is available by prescription. Ask your caregiver for advice about which medicines to use and how to use them based on your health history. Your caregiver will tell you what side effects to look out for if you choose to be on a medicine or therapy. Carefully read the information on the package. Do not use any other product containing nicotine while using a nicotine replacement product.  RELAPSE OR DIFFICULT SITUATIONS Most relapses occur within the first 3 months after quitting. Do not be discouraged if you start smoking again. Remember, most people try several times before finally quitting. You may have symptoms of withdrawal because your body is used to nicotine. You may crave cigarettes, be irritable, feel very hungry, cough often, get headaches, or have difficulty concentrating. The withdrawal symptoms are only temporary. They are strongest when you first quit, but they will go away within  10 14 days. To reduce the chances of relapse, try to:  Avoid drinking alcohol. Drinking lowers your chances of successfully quitting.  Reduce the amount of caffeine you consume. Once you quit smoking, the amount of caffeine in your body increases and can give you symptoms, such as a rapid heartbeat, sweating, and anxiety.  Avoid smokers because they can make you want to smoke.  Do not let weight gain distract you. Many smokers will gain weight when they quit, usually less than 10 pounds. Eat a healthy diet and stay active. You can always lose the weight gained after you quit.  Find ways to improve your mood other than smoking. FOR MORE INFORMATION  www.smokefree.gov  Document Released: 12/23/2000 Document Revised: 06/30/2011 Document Reviewed: 04/09/2011 [employer] Patient Information 2014 [employer].  

## 2013-07-26 ENCOUNTER — Ambulatory Visit: Payer: BC Managed Care – PPO | Admitting: Family Medicine

## 2013-08-29 ENCOUNTER — Encounter: Payer: Self-pay | Admitting: Family Medicine

## 2013-08-29 ENCOUNTER — Ambulatory Visit (INDEPENDENT_AMBULATORY_CARE_PROVIDER_SITE_OTHER): Payer: BC Managed Care – PPO | Admitting: Family Medicine

## 2013-08-29 VITALS — BP 140/90 | HR 84 | Temp 97.9°F | Wt 165.0 lb

## 2013-08-29 DIAGNOSIS — R05 Cough: Secondary | ICD-10-CM

## 2013-08-29 DIAGNOSIS — R1033 Periumbilical pain: Secondary | ICD-10-CM

## 2013-08-29 DIAGNOSIS — R059 Cough, unspecified: Secondary | ICD-10-CM

## 2013-08-29 DIAGNOSIS — R058 Other specified cough: Secondary | ICD-10-CM

## 2013-08-29 LAB — CBC WITH DIFFERENTIAL/PLATELET
BASOS PCT: 0.3 % (ref 0.0–3.0)
Basophils Absolute: 0 10*3/uL (ref 0.0–0.1)
EOS ABS: 0.1 10*3/uL (ref 0.0–0.7)
EOS PCT: 1.1 % (ref 0.0–5.0)
HEMATOCRIT: 42.9 % (ref 39.0–52.0)
Hemoglobin: 14.2 g/dL (ref 13.0–17.0)
LYMPHS ABS: 3.5 10*3/uL (ref 0.7–4.0)
Lymphocytes Relative: 27.8 % (ref 12.0–46.0)
MCHC: 33.1 g/dL (ref 30.0–36.0)
MCV: 90.2 fl (ref 78.0–100.0)
MONO ABS: 0.7 10*3/uL (ref 0.1–1.0)
Monocytes Relative: 5.7 % (ref 3.0–12.0)
NEUTROS PCT: 65.1 % (ref 43.0–77.0)
Neutro Abs: 8.1 10*3/uL — ABNORMAL HIGH (ref 1.4–7.7)
Platelets: 247 10*3/uL (ref 150.0–400.0)
RBC: 4.75 Mil/uL (ref 4.22–5.81)
RDW: 14.3 % (ref 11.5–15.5)
WBC: 12.4 10*3/uL — ABNORMAL HIGH (ref 4.0–10.5)

## 2013-08-29 MED ORDER — DICYCLOMINE HCL 10 MG PO CAPS: 10.0000 mg | ORAL_CAPSULE | Freq: Three times a day (TID) | ORAL | Status: DC

## 2013-08-29 NOTE — Progress Notes (Signed)
   Subjective:    Patient ID: Benjamin Hines, male    DOB: 02/06/1960, 53 y.o.   MRN: [id number]  Abdominal Pain Pertinent negatives include no constipation, diarrhea, dysuria, fever, nausea or vomiting.   Patient seen with abdominal pain. Onset about 2 weeks ago. No injury. He describes this as somewhat of feeling of bloatedness. He's had normal bowel movements. No nausea or vomiting. Good appetite and no significant weight changes. No urinary symptoms. Pain is mild to moderate and comes and goes but becoming more constant. Possibly worse with certain foods such as cucumbers. Location is periumbilical without radiation. Patient had colonoscopy 2010 with no significant abnormalities. He had labs in April but were unremarkable. Denies nausea, vomiting, or diarrhea. No alleviating factors. Has not taken any medications.  Does continue to smoke. One-week history of dry cough. Frequent rhinorrhea. Patient questions allergies.  Past Medical History  Diagnosis Date  . Hypertension    Past Surgical History  Procedure Laterality Date  . Foot surgery      left foot    reports that he has been smoking.  He does not have any smokeless tobacco history on file. He reports that he drinks alcohol. He reports that he does not use illicit drugs. family history is not on file. No Known Allergies    Review of Systems  Constitutional: Negative for fever, chills, appetite change and unexpected weight change.  Respiratory: Negative for cough and shortness of breath.   Cardiovascular: Negative for chest pain.  Gastrointestinal: Positive for abdominal pain. Negative for nausea, vomiting, diarrhea, constipation and blood in stool.  Endocrine: Negative for polydipsia and polyuria.  Genitourinary: Negative for dysuria.  Neurological: Negative for dizziness.       Objective:   Physical Exam  Constitutional: He appears well-developed and well-nourished.  HENT:  Mouth/Throat: Oropharynx is clear and  moist.  Cerumen impaction both ears removed with curette  Neck: Neck supple.  Cardiovascular: Normal rate and regular rhythm.   Pulmonary/Chest: Effort normal and breath sounds normal. No respiratory distress. He has no wheezes. He has no rales.  Abdominal: Soft. Bowel sounds are normal. He exhibits no distension and no mass. There is no tenderness. There is no rebound and no guarding.  No reproducible tenderness on exam today. No mass  Musculoskeletal: He exhibits no edema.          Assessment & Plan:  #1 periumbilical abdominal pain. Nonfocal exam.   Has not had any worrisome appetite or weight changes or stool changes. Check CBC. Consider trial of dicyclomine 10 mg 4 times a day when necessary. Consider ultrasound abdomen and pelvis if symptoms persist #2 dry cough. Question allergic postnasal drip related. Trial of Allegra over-the-counter. Touch base in one week if cough not resolving.

## 2013-08-29 NOTE — Patient Instructions (Signed)

## 2013-08-29 NOTE — Progress Notes (Signed)
Pre visit review using our clinic review tool, if applicable. No additional management support is needed unless otherwise documented below in the visit note. 

## 2013-09-06 ENCOUNTER — Emergency Department (HOSPITAL_COMMUNITY)
Admission: EM | Admit: 2013-09-06 | Discharge: 2013-09-06 | Disposition: A | Discharge status: Home / Self Care | Payer: Self-pay | Attending: Emergency Medicine | Admitting: Emergency Medicine

## 2013-09-06 ENCOUNTER — Encounter (HOSPITAL_COMMUNITY): Payer: Self-pay | Admitting: Emergency Medicine

## 2013-09-06 ENCOUNTER — Emergency Department (HOSPITAL_COMMUNITY): Payer: Self-pay

## 2013-09-06 DIAGNOSIS — R079 Chest pain, unspecified: Secondary | ICD-10-CM | POA: Insufficient documentation

## 2013-09-06 DIAGNOSIS — R05 Cough: Secondary | ICD-10-CM | POA: Insufficient documentation

## 2013-09-06 DIAGNOSIS — R059 Cough, unspecified: Secondary | ICD-10-CM | POA: Insufficient documentation

## 2013-09-06 DIAGNOSIS — I10 Essential (primary) hypertension: Secondary | ICD-10-CM | POA: Insufficient documentation

## 2013-09-06 DIAGNOSIS — Z79899 Other long term (current) drug therapy: Secondary | ICD-10-CM | POA: Insufficient documentation

## 2013-09-06 DIAGNOSIS — F17200 Nicotine dependence, unspecified, uncomplicated: Secondary | ICD-10-CM | POA: Insufficient documentation

## 2013-09-06 LAB — TROPONIN I: Troponin I: <0.3 ng/mL (ref <0.30)

## 2013-09-06 LAB — CBC WITH DIFFERENTIAL/PLATELET
BASOS ABS: 0 10*3/uL (ref 0.0–0.1)
BASOS PCT: 0 % (ref 0–1)
EOS ABS: 0.2 10*3/uL (ref 0.0–0.7)
EOS PCT: 2 % (ref 0–5)
HEMATOCRIT: 37.6 % — AB (ref 39.0–52.0)
Hemoglobin: 12.8 g/dL — ABNORMAL LOW (ref 13.0–17.0)
LYMPHS PCT: 41 % (ref 12–46)
Lymphs Abs: 3.8 10*3/uL (ref 0.7–4.0)
MCH: 29.8 pg (ref 26.0–34.0)
MCHC: 34 g/dL (ref 30.0–36.0)
MCV: 87.4 fL (ref 78.0–100.0)
MONO ABS: 0.6 10*3/uL (ref 0.1–1.0)
Monocytes Relative: 7 % (ref 3–12)
Neutro Abs: 4.8 10*3/uL (ref 1.7–7.7)
Neutrophils Relative %: 50 % (ref 43–77)
PLATELETS: 248 10*3/uL (ref 150–400)
RBC: 4.3 MIL/uL (ref 4.22–5.81)
RDW: 13.8 % (ref 11.5–15.5)
WBC: 9.3 10*3/uL (ref 4.0–10.5)

## 2013-09-06 LAB — BASIC METABOLIC PANEL
ANION GAP: 13 (ref 5–15)
BUN: 11 mg/dL (ref 6–23)
CALCIUM: 8.9 mg/dL (ref 8.4–10.5)
CO2: 23 meq/L (ref 19–32)
CREATININE: 0.8 mg/dL (ref 0.50–1.35)
Chloride: 107 mEq/L (ref 96–112)
Glucose, Bld: 88 mg/dL (ref 70–99)
Potassium: 3.8 mEq/L (ref 3.7–5.3)
SODIUM: 143 meq/L (ref 137–147)

## 2013-09-06 MED ORDER — ASPIRIN 81 MG PO CHEW: 324.0000 mg | CHEWABLE_TABLET | Freq: Once | ORAL | Status: DC

## 2013-09-06 MED ORDER — OMEPRAZOLE 20 MG PO CPDR: 20.0000 mg | DELAYED_RELEASE_CAPSULE | Freq: Every day | ORAL | Status: DC

## 2013-09-06 MED ORDER — PANTOPRAZOLE SODIUM 40 MG PO TBEC
40.0000 mg | DELAYED_RELEASE_TABLET | Freq: Once | ORAL | Status: AC
  Administered 2013-09-06: 40 mg via ORAL
  Filled 2013-09-06: qty 1

## 2013-09-06 NOTE — ED Notes (Signed)
Pt took 325 ASA at home at 0200

## 2013-09-06 NOTE — ED Provider Notes (Signed)
CSN: [id number]     Arrival date & time 09/06/13  0246 History   First MD Initiated Contact with Patient 09/06/13 0259     Chief Complaint  Patient presents with  . Chest Pain     (Consider location/radiation/quality/duration/timing/severity/associated sxs/prior Treatment) HPI  This is a 53 year old male with a history of hypertension who presents with chest pain. Patient reports he has had one week of waxing and waning chest pain. He states that the pain is throbbing and burning area but is in the center of his chest and radiates to his stomach. Tonight his pain started approximately one hour ago with him up from sleep. It lasted for approximately 30 minutes and he is currently pain-free. He denies any sweating or shortness of breath with this episode of chest pain but states he has had sweating in the past. No association with exertion. He states the pain is sometimes worse with food. He reports a nonproductive cough. He is a smoker. No known early family history of heart disease.  Past Medical History  Diagnosis Date  . Hypertension    Past Surgical History  Procedure Laterality Date  . Foot surgery      left foot   History reviewed. No pertinent family history. History  Substance Use Topics  . Smoking status: Current Every Day Smoker -- 1.00 packs/day  . Smokeless tobacco: Not on file  . Alcohol Use: No    Review of Systems  Constitutional: Negative.  Negative for fever.  Respiratory: Positive for cough and chest tightness. Negative for shortness of breath.   Cardiovascular: Positive for chest pain. Negative for leg swelling.  Gastrointestinal: Negative.  Negative for nausea, vomiting and abdominal pain.  Genitourinary: Negative.  Negative for dysuria.  Musculoskeletal: Negative for back pain.  Neurological: Negative for headaches.  All other systems reviewed and are negative.     Allergies  Review of patient's allergies indicates no known allergies.  Home  Medications   Prior to Admission medications   Medication Sig Start Date End Date Taking? Authorizing Provider  amLODipine (NORVASC) 10 MG tablet Take 1 tablet (10 mg total) by mouth daily. 12/06/12  Yes Eulas Post, MD  cetirizine (ZYRTEC) 10 MG tablet Take 10 mg by mouth daily.   Yes Historical Provider, MD  dicyclomine (BENTYL) 10 MG capsule Take 1 capsule (10 mg total) by mouth 4 (four) times daily -  before meals and at bedtime. 08/29/13  Yes Eulas Post, MD  omeprazole (PRILOSEC) 20 MG capsule Take 1 capsule (20 mg total) by mouth daily. 09/06/13   Merryl Hacker, MD  omeprazole (PRILOSEC) 40 MG capsule Take 1 capsule (40 mg total) by mouth daily. 03/04/10 03/04/11  Burnice Logan, MD   BP 155/106  Pulse 65  Temp(Src) 97.9 F (36.6 C) (Oral)  Resp 17  Ht 5\' 9"  (1.753 m)  Wt 165 lb (74.844 kg)  BMI 24.36 kg/m2  SpO2 100% Physical Exam  Nursing note and vitals reviewed. Constitutional: He is oriented to person, place, and time. He appears well-developed and well-nourished. No distress.  HENT:  Head: Normocephalic and atraumatic.  Eyes: Pupils are equal, round, and reactive to light.  Cardiovascular: Normal rate, regular rhythm and normal heart sounds.   No murmur heard. Pulmonary/Chest: Effort normal and breath sounds normal. No respiratory distress. He has no wheezes. He exhibits no tenderness.  Abdominal: Soft. Bowel sounds are normal. There is no tenderness. There is no rebound.  Musculoskeletal: He exhibits no edema.  Neurological: He is alert and oriented to person, place, and time.  Skin: Skin is warm and dry.  Psychiatric: He has a normal mood and affect.    ED Course  Procedures (including critical care time) Labs Review Labs Reviewed  CBC WITH DIFFERENTIAL - Abnormal; Notable for the following:    Hemoglobin 12.8 (*)    HCT 37.6 (*)    All other components within normal limits  TROPONIN I  BASIC METABOLIC PANEL  TROPONIN I    Imaging Review Dg  Chest 2 View  09/06/2013   CLINICAL DATA:  Chest pain and cough.  EXAM: CHEST  2 VIEW  COMPARISON:  Chest radiograph September 29, 2012  FINDINGS: Cardiomediastinal silhouette is unremarkable. The lungs are clear without pleural effusions or focal consolidations. Elevated left hemidiaphragm, unchanged with similar atelectasis. Trachea projects midline and there is no pneumothorax. Soft tissue planes and included osseous structures are non-suspicious.  IMPRESSION: No acute cardiopulmonary process.  Chronically elevated left hemidiaphragm with atelectasis.   Electronically Signed   By: Elon Alas   On: 09/06/2013 03:54     EKG Interpretation   Date/Time:  Wednesday September 06 2013 02:49:12 EDT Ventricular Rate:  69 PR Interval:  153 QRS Duration: 96 QT Interval:  379 QTC Calculation: 406 R Axis:   46 Text Interpretation:  Age not entered, assumed to be  53 years old for  purpose of ECG interpretation Sinus rhythm Abnormal R-wave progression,  early transition No prior for comparison Confirmed by Alika Saladin  MD, Zeric Baranowski  ([id number]) on 09/06/2013 2:53:22 AM      MDM   Final diagnoses:  Chest pain, unspecified chest pain type    Patient presents with chest pain. Features somewhat atypical for ACS as they have been associated with food. However, patient does have risk factors including hypertension and smoking. EKG is reassuring.  Initial troponin and lab workup is reassuring. Chest x-ray shows no evidence of pneumothorax. Discussed with patient obtaining a delta troponin. If this is negative, will have patient follow closely with cardiology for stress testing. He has had no recurrence of chest pain while the ER. Will also start on PPI given some symptoms are consistent with reflux. Patient stated understanding and is in agreement with plan.  After history, exam, and medical workup I feel the patient has been appropriately medically screened and is safe for discharge home. Pertinent diagnoses  were discussed with the patient. Patient was given return precautions.     Merryl Hacker, MD 09/06/13 [id number]

## 2013-09-06 NOTE — ED Notes (Signed)
Pt reports mid sternal cp that "goes down to my stomach" for about one week. Pain worsened over last 24 hrs, felt like he was going to pass out at work yesterday.  Also has cough, shortness of breath.

## 2013-09-06 NOTE — Discharge Instructions (Signed)

## 2013-09-06 NOTE — ED Notes (Signed)
Patient walking home for discharge, reporting he has already signed for discharge paperwork.

## 2013-09-13 ENCOUNTER — Ambulatory Visit (INDEPENDENT_AMBULATORY_CARE_PROVIDER_SITE_OTHER): Payer: Self-pay | Admitting: Family Medicine

## 2013-09-13 ENCOUNTER — Encounter: Payer: Self-pay | Admitting: Family Medicine

## 2013-09-13 VITALS — BP 148/98 | HR 90 | Temp 97.9°F | Wt 162.0 lb

## 2013-09-13 DIAGNOSIS — R0981 Nasal congestion: Secondary | ICD-10-CM

## 2013-09-13 DIAGNOSIS — R0609 Other forms of dyspnea: Secondary | ICD-10-CM

## 2013-09-13 DIAGNOSIS — R0789 Other chest pain: Secondary | ICD-10-CM

## 2013-09-13 DIAGNOSIS — J3489 Other specified disorders of nose and nasal sinuses: Secondary | ICD-10-CM

## 2013-09-13 DIAGNOSIS — I10 Essential (primary) hypertension: Secondary | ICD-10-CM

## 2013-09-13 DIAGNOSIS — R0989 Other specified symptoms and signs involving the circulatory and respiratory systems: Secondary | ICD-10-CM

## 2013-09-13 DIAGNOSIS — R0600 Dyspnea, unspecified: Secondary | ICD-10-CM

## 2013-09-13 MED ORDER — LISINOPRIL 10 MG PO TABS: 10.0000 mg | ORAL_TABLET | Freq: Every day | ORAL | Status: DC

## 2013-09-13 NOTE — Patient Instructions (Signed)
We will call you with stress test Start Lisinopril once daily for high blood pressure No heavy physical exertion until heart further checked. Consider baby aspirin 81 mg once daily Consider Flonase or Nasacort for nasal congestion.

## 2013-09-13 NOTE — Progress Notes (Signed)
   Subjective:    Patient ID: Benjamin Hines, male    DOB: 03/31/1960, 53 y.o.   MRN: [id number]  HPI ER followup. Patient was seen there on 09/06/2013 with somewhat atypical chest pain. He actually woke up from sleep with burning and throbbing pain substernally. No significant radiation. No dyspnea with that episode. No diaphoresis. He had reported one week of waxing and waning pain but never exertional. He does relate he had some mild dyspnea with exertion recently but no chest pain. Chest pain possibly worse with food intake. Got chest x-ray which was unremarkable. Troponins were negative. He was discharged home with recommended followup here. He had no episodes since he was seen in ER. No prior history of CAD.  He does have risk factors including only nicotine use and hypertension which is currently not well controlled.  Also complains of left nostril congestion. No major discharge. Occasional headaches. No fever.  Past Medical History  Diagnosis Date  . Hypertension    Past Surgical History  Procedure Laterality Date  . Foot surgery      left foot    reports that he has been smoking.  He does not have any smokeless tobacco history on file. He reports that he does not drink alcohol or use illicit drugs. family history is not on file. No Known Allergies    Review of Systems  Constitutional: Negative for fatigue.  HENT: Positive for congestion.   Eyes: Negative for visual disturbance.  Respiratory: Negative for cough, chest tightness and shortness of breath.   Cardiovascular: Positive for chest pain. Negative for palpitations and leg swelling.  Neurological: Positive for headaches. Negative for dizziness, syncope, weakness and light-headedness.       Objective:   Physical Exam  Constitutional: He appears well-developed and well-nourished.  HENT:  Right Ear: External ear normal.  Left Ear: External ear normal.  Mouth/Throat: Oropharynx is clear and moist.  Clear mucous  and mild turbinate swelling left naris. No visible polyps  Neck: Neck supple. No thyromegaly present.  Cardiovascular: Normal rate and regular rhythm.   Pulmonary/Chest: Effort normal and breath sounds normal. No respiratory distress. He has no wheezes. He has no rales.  Musculoskeletal: He exhibits no edema.          Assessment & Plan:  #1 recent atypical chest pain. We recommended stress test for further evaluation. Consider baby aspirin one daily. Avoid heavy exertion until further evaluated #2 hypertension poorly controlled. Add lisinopril 10 mg daily to his amlodipine and reassess one month #3 nasal congestion. Suspect allergic. Flonase or Nasacort once daily

## 2013-09-13 NOTE — Progress Notes (Signed)
Pre visit review using our clinic review tool, if applicable. No additional management support is needed unless otherwise documented below in the visit note. 

## 2013-09-22 ENCOUNTER — Ambulatory Visit (HOSPITAL_COMMUNITY): Payer: Self-pay | Attending: Family Medicine | Admitting: Radiology

## 2013-09-22 VITALS — BP 130/90 | HR 64 | Ht 69.0 in | Wt 158.0 lb

## 2013-09-22 DIAGNOSIS — I10 Essential (primary) hypertension: Secondary | ICD-10-CM | POA: Insufficient documentation

## 2013-09-22 DIAGNOSIS — R0609 Other forms of dyspnea: Secondary | ICD-10-CM | POA: Insufficient documentation

## 2013-09-22 DIAGNOSIS — R079 Chest pain, unspecified: Secondary | ICD-10-CM | POA: Insufficient documentation

## 2013-09-22 DIAGNOSIS — R0989 Other specified symptoms and signs involving the circulatory and respiratory systems: Secondary | ICD-10-CM | POA: Insufficient documentation

## 2013-09-22 DIAGNOSIS — R0789 Other chest pain: Secondary | ICD-10-CM

## 2013-09-22 MED ORDER — TECHNETIUM TC 99M SESTAMIBI GENERIC - CARDIOLITE
33.0000 | Freq: Once | INTRAVENOUS | Status: AC | PRN
  Administered 2013-09-22: 33 via INTRAVENOUS

## 2013-09-22 MED ORDER — TECHNETIUM TC 99M SESTAMIBI GENERIC - CARDIOLITE
11.0000 | Freq: Once | INTRAVENOUS | Status: AC | PRN
Start: 2013-09-22 — End: 2013-09-22
  Administered 2013-09-22: 11 via INTRAVENOUS

## 2013-09-22 NOTE — Progress Notes (Signed)
[facility] [address] [place], [place] [address] [phone]    Cardiology Nuclear Med Study  Benjamin Hines is a 53 y.o. male     MRN : [id number]     DOB: [birth year]  Procedure Date: 09/22/2013  Nuclear Med Background Indication for Stress Test:  Evaluation for Ischemia, and Patient seen in hospital on 09-06-2013 for Chest Pain, Enzymes negative History:  No known CAD Cardiac Risk Factors: Hypertension  Symptoms:  Chest Pain and DOE   Nuclear Pre-Procedure Caffeine/Decaff Intake:  None>12 hrs NPO After: 8:30pm   Lungs:  clear O2 Sat: 98% on room air. IV 0.9% NS with Angio Cath:  22g  IV Site: R Antecubital x 1, tolerated well IV Started by:  Irven Baltimore, RN  Chest Size (in):  40 Cup Size: n/a  Height: 5\' 9"  (1.753 m)  Weight:  158 lb (71.668 kg)  BMI:  Body mass index is 23.32 kg/(m^2). Tech Comments:  Patient took Lisinopril this am and Norvasc last night. Irven Baltimore, RN.    Nuclear Med Study 1 or 2 day study: 1 day  Stress Test Type:  Stress  Reading MD: N/A  Order Authorizing Provider:  Carolann Littler, MD  Resting Radionuclide: Technetium 73m Sestamibi  Resting Radionuclide Dose: 11.0 mCi   Stress Radionuclide:  Technetium 66m Sestamibi  Stress Radionuclide Dose: 33.0 mCi           Stress Protocol Rest HR: 64 Stress HR: 146  Rest BP: 130/90 Stress BP: 160/102  Exercise Time (min): 9:00 METS: 10.1           Dose of Adenosine (mg):  n/a Dose of Lexiscan: n/a mg  Dose of Atropine (mg): n/a Dose of Dobutamine: n/a mcg/kg/min (at max HR)  Stress Test Technologist: Glade Lloyd, BS-ES  Nuclear Technologist:  Annye Rusk, CNMT     Rest Procedure:  Myocardial perfusion imaging was performed at rest 45 minutes following the intravenous administration of Technetium 28m Sestamibi. Rest ECG: NSR - Normal EKG  Stress Procedure:  The patient exercised on the treadmill utilizing the Bruce Protocol for 9:00 minutes. The patient  stopped due to fatigue and denied any chest pain.  Technetium 40m Sestamibi was injected at peak exercise and myocardial perfusion imaging was performed after a brief delay. Stress ECG: less than 66mm of horizontal ST segment depression in V5 and V6 transiently at peak exercise although there was significant wandering baseline artifact.  In recover there was flattening of the ST segments and the  T waves became biphasic   QPS Raw Data Images:  Normal; no motion artifact; normal heart/lung ratio. Stress Images:  There is decreased uptake in the inferior wall. Rest Images:  There is decreased uptake in the inferior wall. Subtraction (SDS):  There is a fixed inferior defect that is most consistent with diaphragmatic attenuation. Transient Ischemic Dilatation (Normal <1.22):  1.00 Lung/Heart Ratio (Normal <0.45):  0.23  Quantitative Gated Spect Images QGS EDV:  86 ml QGS ESV:  38 ml  Impression Exercise Capacity:  Good exercise capacity. BP Response:  ? hypotensive BP respsonse to exercise.  BP in stage 1 was 136/87 rising to 172/100 in stage 2 and dropped to 124/97 in stage 3 although tech made a ? of accuracy of BP reading.  Initial BP in recovery was 161/102 Clinical Symptoms:  No significant symptoms noted. ECG Impression:  less than 63mm of horizontal ST segment depression in V5 and V6 transiently at peak exercise  although there was significant wandering baseline artifact.  In recovery there was flattening of the ST segments and the T waves became biphasic in the lateral precordial leads  Comparison with Prior Nuclear Study: No images to compare  Overall Impression:  Low risk stress nuclear study with a fixed defect in the inferior wall c/w diaphragmatic attenuation.  No ischemic noted. Nonspecific ST/T wave changes noted during exercise and in recovery with ? Hypotensive BP response during exercise (see above note).  LV Ejection Fraction: 56%.  LV Wall Motion:  NL LV Function; NL Wall  Motion  Signed: Fransico Him, MD [facility]

## 2013-09-25 ENCOUNTER — Telehealth: Payer: Self-pay

## 2013-09-25 NOTE — Telephone Encounter (Signed)
NO.  They can effect ED but should not have any effect on libido.

## 2013-09-25 NOTE — Telephone Encounter (Signed)
Pt stated that since he has been on lisinopril and amlodipine his sex drive has been low. Pt wants to know does those two medication have anything to do with his sex drive.

## 2013-09-26 NOTE — Telephone Encounter (Signed)
Pt informed

## 2013-10-30 ENCOUNTER — Ambulatory Visit (INDEPENDENT_AMBULATORY_CARE_PROVIDER_SITE_OTHER): Payer: Self-pay | Admitting: Family Medicine

## 2013-10-30 ENCOUNTER — Encounter: Payer: Self-pay | Admitting: Family Medicine

## 2013-10-30 VITALS — BP 154/98 | HR 82 | Temp 98.2°F | Wt 168.0 lb

## 2013-10-30 DIAGNOSIS — N529 Male erectile dysfunction, unspecified: Secondary | ICD-10-CM | POA: Insufficient documentation

## 2013-10-30 DIAGNOSIS — I10 Essential (primary) hypertension: Secondary | ICD-10-CM

## 2013-10-30 DIAGNOSIS — N528 Other male erectile dysfunction: Secondary | ICD-10-CM

## 2013-10-30 MED ORDER — SILDENAFIL CITRATE 100 MG PO TABS: 50.0000 mg | ORAL_TABLET | Freq: Every day | ORAL | Status: DC | PRN

## 2013-10-30 MED ORDER — LISINOPRIL-HYDROCHLOROTHIAZIDE 20-12.5 MG PO TABS: 1.0000 | ORAL_TABLET | Freq: Every day | ORAL | Status: DC

## 2013-10-30 NOTE — Progress Notes (Signed)
Pre visit review using our clinic review tool, if applicable. No additional management support is needed unless otherwise documented below in the visit note. 

## 2013-10-30 NOTE — Progress Notes (Signed)
   Subjective:    Patient ID: Benjamin Hines, male    DOB: Nov 01, 1960, 53 y.o.   MRN: [id number]  HPI  Patient here for medical followup. Recent atypical chest pain. Nuclear stress test was low risk with no evidence for ischemia. Blood pressure was poorly controlled. We added lisinopril 10 mg daily to amlodipine. He's not monitoring blood pressure at home. He's not had any further chest pain. He states is compliant with therapy. He is still smoking but hopes to quit soon.  Complains of erectile dysfunction. No history of nitroglycerin use. Has not tried medications previously for this. He was having these issues before starting any blood pressure medication. Normal libido.  Past Medical History  Diagnosis Date  . Hypertension    Past Surgical History  Procedure Laterality Date  . Foot surgery      left foot    reports that he has been smoking.  He does not have any smokeless tobacco history on file. He reports that he does not drink alcohol or use illicit drugs. family history is not on file. No Known Allergies    Review of Systems  Constitutional: Negative for fatigue.  Eyes: Negative for visual disturbance.  Respiratory: Negative for chest tightness and shortness of breath.   Cardiovascular: Negative for chest pain, palpitations and leg swelling.  Neurological: Negative for dizziness, syncope, weakness, light-headedness and headaches.       Objective:   Physical Exam  Constitutional: He appears well-developed and well-nourished.  Cardiovascular: Normal rate and regular rhythm.   Pulmonary/Chest: Effort normal and breath sounds normal. No respiratory distress. He has no wheezes. He has no rales.  Musculoskeletal: He exhibits no edema.          Assessment & Plan:  #1 hypertension. Suboptimal control. Change lisinopril to lisinopril HCTZ 20/12.5 one daily. Continue amlodipine. He is encouraged to quit smoking #2 erectile dysfunction. Trial of Viagra 100 mg one half to  one tablet daily as needed

## 2013-11-30 ENCOUNTER — Ambulatory Visit: Payer: Self-pay | Admitting: Family Medicine

## 2014-01-01 ENCOUNTER — Ambulatory Visit (INDEPENDENT_AMBULATORY_CARE_PROVIDER_SITE_OTHER): Payer: Self-pay | Admitting: Family Medicine

## 2014-01-01 ENCOUNTER — Encounter: Payer: Self-pay | Admitting: Family Medicine

## 2014-01-01 VITALS — BP 158/98 | HR 80 | Temp 98.1°F | Wt 165.0 lb

## 2014-01-01 DIAGNOSIS — I10 Essential (primary) hypertension: Secondary | ICD-10-CM

## 2014-01-01 NOTE — Progress Notes (Signed)
Pre visit review using our clinic review tool, if applicable. No additional management support is needed unless otherwise documented below in the visit note. 

## 2014-01-01 NOTE — Progress Notes (Signed)
   Subjective:    Patient ID: Benjamin Hines, male    DOB: 1960/04/06, 53 y.o.   MRN: [id number]  HPI Follow-up hypertension. Poor compliance with diet. High sodium food choices. He is taking lisinopril HCTZ which we changed to last visit but unfortunately not taking his amlodipine consistently. No headaches. No dizziness. No peripheral edema. Still smoking but trying to cut back  Past Medical History  Diagnosis Date  . Hypertension    Past Surgical History  Procedure Laterality Date  . Foot surgery      left foot    reports that he has been smoking.  He does not have any smokeless tobacco history on file. He reports that he does not drink alcohol or use illicit drugs. family history is not on file. No Known Allergies    Review of Systems  Constitutional: Negative for fatigue.  Eyes: Negative for visual disturbance.  Respiratory: Negative for cough, chest tightness and shortness of breath.   Cardiovascular: Negative for chest pain, palpitations and leg swelling.  Neurological: Negative for dizziness, syncope, weakness, light-headedness and headaches.       Objective:   Physical Exam  Constitutional: He is oriented to person, place, and time. He appears well-developed and well-nourished.  HENT:  Right Ear: External ear normal.  Left Ear: External ear normal.  Mouth/Throat: Oropharynx is clear and moist.  Eyes: Pupils are equal, round, and reactive to light.  Neck: Neck supple. No thyromegaly present.  Cardiovascular: Normal rate and regular rhythm.   Pulmonary/Chest: Effort normal and breath sounds normal. No respiratory distress. He has no wheezes. He has no rales.  Musculoskeletal: He exhibits no edema.  Neurological: He is alert and oriented to person, place, and time.          Assessment & Plan:  Hypertension. Poorly controlled. Poor compliance with medication. He is strongly advised to get back on regular use of lisinopril HCTZ and amlodipine. Informational  DASH diet given. Reassess one month

## 2014-01-01 NOTE — Patient Instructions (Signed)
DASH Eating Plan DASH stands for "Dietary Approaches to Stop Hypertension." The DASH eating plan is a healthy eating plan that has been shown to reduce high blood pressure (hypertension). Additional health benefits may include reducing the risk of type 2 diabetes mellitus, heart disease, and stroke. The DASH eating plan may also help with weight loss. WHAT DO I NEED TO KNOW ABOUT THE DASH EATING PLAN? For the DASH eating plan, you will follow these general guidelines:  Choose foods with a percent daily value for sodium of less than 5% (as listed on the food label).  Use salt-free seasonings or herbs instead of table salt or sea salt.  Check with your health care provider or pharmacist before using salt substitutes.  Eat lower-sodium products, often labeled as "lower sodium" or "no salt added."  Eat fresh foods.  Eat more vegetables, fruits, and low-fat dairy products.  Choose whole grains. Look for the word "whole" as the first word in the ingredient list.  Choose fish and skinless chicken or turkey more often than red meat. Limit fish, poultry, and meat to 6 oz (170 g) each day.  Limit sweets, desserts, sugars, and sugary drinks.  Choose heart-healthy fats.  Limit cheese to 1 oz (28 g) per day.  Eat more home-cooked food and less restaurant, buffet, and fast food.  Limit fried foods.  Cook foods using methods other than frying.  Limit canned vegetables. If you do use them, rinse them well to decrease the sodium.  When eating at a restaurant, ask that your food be prepared with less salt, or no salt if possible. WHAT FOODS CAN I EAT? Seek help from a dietitian for individual calorie needs. Grains Whole grain or whole wheat bread. Brown rice. Whole grain or whole wheat pasta. Quinoa, bulgur, and whole grain cereals. Low-sodium cereals. Corn or whole wheat flour tortillas. Whole grain cornbread. Whole grain crackers. Low-sodium crackers. Vegetables Fresh or frozen vegetables  (raw, steamed, roasted, or grilled). Low-sodium or reduced-sodium tomato and vegetable juices. Low-sodium or reduced-sodium tomato sauce and paste. Low-sodium or reduced-sodium canned vegetables.  Fruits All fresh, canned (in natural juice), or frozen fruits. Meat and Other Protein Products Ground beef (85% or leaner), grass-fed beef, or beef trimmed of fat. Skinless chicken or turkey. Ground chicken or turkey. Pork trimmed of fat. All fish and seafood. Eggs. Dried beans, peas, or lentils. Unsalted nuts and seeds. Unsalted canned beans. Dairy Low-fat dairy products, such as skim or 1% milk, 2% or reduced-fat cheeses, low-fat ricotta or cottage cheese, or plain low-fat yogurt. Low-sodium or reduced-sodium cheeses. Fats and Oils Tub margarines without trans fats. Light or reduced-fat mayonnaise and salad dressings (reduced sodium). Avocado. Safflower, olive, or canola oils. Natural peanut or almond butter. Other Unsalted popcorn and pretzels. The items listed above may not be a complete list of recommended foods or beverages. Contact your dietitian for more options. WHAT FOODS ARE NOT RECOMMENDED? Grains White bread. White pasta. White rice. Refined cornbread. Bagels and croissants. Crackers that contain trans fat. Vegetables Creamed or fried vegetables. Vegetables in a cheese sauce. Regular canned vegetables. Regular canned tomato sauce and paste. Regular tomato and vegetable juices. Fruits Dried fruits. Canned fruit in light or heavy syrup. Fruit juice. Meat and Other Protein Products Fatty cuts of meat. Ribs, chicken wings, bacon, sausage, bologna, salami, chitterlings, fatback, hot dogs, bratwurst, and packaged luncheon meats. Salted nuts and seeds. Canned beans with salt. Dairy Whole or 2% milk, cream, half-and-half, and cream cheese. Whole-fat or sweetened yogurt. Full-fat   cheeses or blue cheese. Nondairy creamers and whipped toppings. Processed cheese, cheese spreads, or cheese  curds. Condiments Onion and garlic salt, seasoned salt, table salt, and sea salt. Canned and packaged gravies. Worcestershire sauce. Tartar sauce. Barbecue sauce. Teriyaki sauce. Soy sauce, including reduced sodium. Steak sauce. Fish sauce. Oyster sauce. Cocktail sauce. Horseradish. Ketchup and mustard. Meat flavorings and tenderizers. Bouillon cubes. Hot sauce. Tabasco sauce. Marinades. Taco seasonings. Relishes. Fats and Oils Butter, stick margarine, lard, shortening, ghee, and bacon fat. Coconut, palm kernel, or palm oils. Regular salad dressings. Other Pickles and olives. Salted popcorn and pretzels. The items listed above may not be a complete list of foods and beverages to avoid. Contact your dietitian for more information. WHERE CAN I FIND MORE INFORMATION? National Heart, Lung, and Blood Institute: travelstabloid.com Document Released: 12/18/2010 Document Revised: 05/15/2013 Document Reviewed: 11/02/2012 [facility] Patient Information 2015 [place], [place]. This information is not intended to replace advice given to you by your health care provider. Make sure you discuss any questions you have with your health care provider.  Be sure to take BOTH the Amlodipine and Lisinopril daily Monitor blood pressure and be in touch if consistently > 140/90

## 2014-01-16 ENCOUNTER — Ambulatory Visit (INDEPENDENT_AMBULATORY_CARE_PROVIDER_SITE_OTHER): Payer: Self-pay | Admitting: Family Medicine

## 2014-01-16 ENCOUNTER — Encounter: Payer: Self-pay | Admitting: Family Medicine

## 2014-01-16 VITALS — BP 140/90 | HR 96 | Temp 98.6°F | Wt 166.0 lb

## 2014-01-16 DIAGNOSIS — B9789 Other viral agents as the cause of diseases classified elsewhere: Principal | ICD-10-CM

## 2014-01-16 DIAGNOSIS — J069 Acute upper respiratory infection, unspecified: Secondary | ICD-10-CM

## 2014-01-16 MED ORDER — HYDROCODONE-HOMATROPINE 5-1.5 MG/5ML PO SYRP: 5.0000 mL | ORAL_SOLUTION | Freq: Four times a day (QID) | ORAL | Status: AC | PRN

## 2014-01-16 NOTE — Patient Instructions (Signed)

## 2014-01-16 NOTE — Progress Notes (Signed)
Pre visit review using our clinic review tool, if applicable. No additional management support is needed unless otherwise documented below in the visit note. 

## 2014-01-16 NOTE — Progress Notes (Signed)
   Subjective:    Patient ID: Benjamin Hines, male    DOB: 21-Aug-1960, 54 y.o.   MRN: [id number]  HPI Acute visit. Patient seen with onset this past Saturday of cough, sore throat, bilateral ear ache, headaches, nasal congestion, increased malaise. He's had no fever. He does smoke about a pack cigarettes per day. Cough has been mostly nonproductive. He tried over-the-counter cough medications without relief. No dyspnea  Past Medical History  Diagnosis Date  . Hypertension    Past Surgical History  Procedure Laterality Date  . Foot surgery      left foot    reports that he has been smoking.  He does not have any smokeless tobacco history on file. He reports that he does not drink alcohol or use illicit drugs. family history is not on file. No Known Allergies    Review of Systems  Constitutional: Positive for fatigue. Negative for fever and chills.  HENT: Positive for congestion and sore throat.   Respiratory: Positive for cough. Negative for shortness of breath and wheezing.   Neurological: Positive for headaches.       Objective:   Physical Exam  Constitutional: He appears well-developed and well-nourished.  HENT:  Right Ear: External ear normal.  Minimal erythema  along the handle the malleus otherwise normal ear exam. Oropharynx reveals minimal erythema. No exudate  Neck: Neck supple.  Cardiovascular: Normal rate and regular rhythm.   Pulmonary/Chest: Effort normal and breath sounds normal. No respiratory distress. He has no wheezes. He has no rales.  Lymphadenopathy:    He has no cervical adenopathy.          Assessment & Plan:  Cough. Suspect acute viral URI with cough. Nonfocal exam regarding his lung exam. Hycodan cough syrup 1 teaspoon daily at bedtime for severe cough. Follow-up promptly for any fever. He's encouraged to quit smoking.

## 2014-01-22 ENCOUNTER — Telehealth: Payer: Self-pay | Admitting: Family Medicine

## 2014-01-22 NOTE — Telephone Encounter (Signed)
We prescribed Hycodan.  Is he not taking this?

## 2014-01-22 NOTE — Telephone Encounter (Signed)
Pt states he has had a cough over a week and would like to know if dr burchette would give him cough med. Pt has appt next Monday. Can move up appt if need. [facility]

## 2014-01-23 ENCOUNTER — Encounter (HOSPITAL_COMMUNITY): Payer: Self-pay | Admitting: Emergency Medicine

## 2014-01-23 ENCOUNTER — Emergency Department (HOSPITAL_COMMUNITY)
Admission: EM | Admit: 2014-01-23 | Discharge: 2014-01-23 | Disposition: A | Discharge status: Home / Self Care | Payer: Self-pay | Attending: Emergency Medicine | Admitting: Emergency Medicine

## 2014-01-23 DIAGNOSIS — Z79899 Other long term (current) drug therapy: Secondary | ICD-10-CM | POA: Insufficient documentation

## 2014-01-23 DIAGNOSIS — I10 Essential (primary) hypertension: Secondary | ICD-10-CM | POA: Insufficient documentation

## 2014-01-23 DIAGNOSIS — Z720 Tobacco use: Secondary | ICD-10-CM | POA: Insufficient documentation

## 2014-01-23 DIAGNOSIS — R0982 Postnasal drip: Secondary | ICD-10-CM | POA: Insufficient documentation

## 2014-01-23 DIAGNOSIS — H9202 Otalgia, left ear: Secondary | ICD-10-CM | POA: Insufficient documentation

## 2014-01-23 MED ORDER — NAPROXEN 375 MG PO TABS: 375.0000 mg | ORAL_TABLET | Freq: Two times a day (BID) | ORAL | Status: DC

## 2014-01-23 MED ORDER — NEOMYCIN-POLYMYXIN-HC 3.5-10000-1 OT SUSP: 4.0000 [drp] | Freq: Three times a day (TID) | OTIC | Status: DC

## 2014-01-23 MED ORDER — FLUTICASONE PROPIONATE 50 MCG/ACT NA SUSP: 2.0000 | Freq: Every day | NASAL | Status: DC

## 2014-01-23 MED ORDER — NAPROXEN 250 MG PO TABS
500.0000 mg | ORAL_TABLET | Freq: Once | ORAL | Status: AC
  Administered 2014-01-23: 500 mg via ORAL
  Filled 2014-01-23: qty 2

## 2014-01-23 MED ORDER — CETIRIZINE-PSEUDOEPHEDRINE ER 5-120 MG PO TB12: 1.0000 | ORAL_TABLET | Freq: Every day | ORAL | Status: DC

## 2014-01-23 MED ORDER — LORATADINE 10 MG PO TABS
10.0000 mg | ORAL_TABLET | Freq: Once | ORAL | Status: AC
  Administered 2014-01-23: 10 mg via ORAL
  Filled 2014-01-23: qty 1

## 2014-01-23 NOTE — ED Provider Notes (Signed)
CSN: [id number]     Arrival date & time 01/23/14  0159 History   This chart was scribed for Graeson Nouri Alfonso Patten, MD by Randa Evens, ED Scribe. This patient was seen in room A11C/A11C and the patient's care was started at 2:15 AM.     Chief Complaint  Patient presents with  . Otalgia    Patient is a 54 y.o. male presenting with ear pain. The history is provided by the patient. No language interpreter was used.  Otalgia Location:  Left Behind ear:  No abnormality Quality:  Aching Severity:  Severe Onset quality:  Gradual Timing:  Constant Progression:  Unchanged Chronicity:  New Context: not direct blow   Relieved by:  Nothing Worsened by:  Nothing tried Ineffective treatments:  None tried Associated symptoms: rhinorrhea   Associated symptoms: no fever and no neck pain   Risk factors: no chronic ear infection    HPI Comments: Benjamin Hines is a 54 y.o. male who presents to the Emergency Department complaining of left ear pain onset 2 weeks prior. Pt states that the pain radiates down into his throat. Pt states that he also feels that the ear pops. Pt states that he has tried cough syrup that he was prescribed for ongoing URI. Pt states that he has also tried Nyquil with no relief. Pt doesn't report any other symptoms.   Past Medical History  Diagnosis Date  . Hypertension    Past Surgical History  Procedure Laterality Date  . Foot surgery      left foot   No family history on file. History  Substance Use Topics  . Smoking status: Current Every Day Smoker -- 1.00 packs/day  . Smokeless tobacco: Not on file  . Alcohol Use: No    Review of Systems  Constitutional: Negative for fever.  HENT: Positive for ear pain and rhinorrhea.   Musculoskeletal: Negative for neck pain.  All other systems reviewed and are negative.     Allergies  Review of patient's allergies indicates no known allergies.  Home Medications   Prior to Admission medications   Medication  Sig Start Date End Date Taking? Authorizing Provider  amLODipine (NORVASC) 10 MG tablet Take 1 tablet (10 mg total) by mouth daily. 12/06/12   Eulas Post, MD  cetirizine (ZYRTEC) 10 MG tablet Take 10 mg by mouth daily.    Historical Provider, MD  HYDROcodone-homatropine (HYCODAN) 5-1.5 MG/5ML syrup Take 5 mLs by mouth every 6 (six) hours as needed. 01/16/14 01/26/14  Eulas Post, MD  lisinopril-hydrochlorothiazide (ZESTORETIC) 20-12.5 MG per tablet Take 1 tablet by mouth daily. 10/30/13   Eulas Post, MD  omeprazole (PRILOSEC) 20 MG capsule Take 1 capsule (20 mg total) by mouth daily. 09/06/13   Merryl Hacker, MD  sildenafil (VIAGRA) 100 MG tablet Take 0.5-1 tablets (50-100 mg total) by mouth daily as needed for erectile dysfunction. 10/30/13   Eulas Post, MD   Triage Vitals: BP 175/122 mmHg  Pulse 92  Temp(Src) 98.5 F (36.9 C) (Oral)  Resp 16  Ht 5\' 9"  (1.753 m)  Wt 165 lb (74.844 kg)  BMI 24.36 kg/m2  SpO2 98%  Physical Exam  Constitutional: He is oriented to person, place, and time. He appears well-developed and well-nourished. No distress.  HENT:  Head: Normocephalic and atraumatic.  Right Ear: No mastoid tenderness. Tympanic membrane is not injected and not erythematous.  Left Ear: Tympanic membrane normal. No mastoid tenderness. Tympanic membrane is not injected and not erythematous.  Mouth/Throat: Oropharynx is clear and moist.  Clear colorless postnasal drip   Eyes: Conjunctivae and EOM are normal. Pupils are equal, round, and reactive to light.  Neck: Normal range of motion. Neck supple. No tracheal deviation present.  Cardiovascular: Normal rate and regular rhythm.   Pulmonary/Chest: Effort normal and breath sounds normal. Stridor present. No respiratory distress. He has no wheezes. He has no rales.  Abdominal: Soft. Bowel sounds are normal. He exhibits no mass. There is no rebound and no guarding.  Musculoskeletal: Normal range of motion.   Lymphadenopathy:    He has no cervical adenopathy.  Neurological: He is alert and oriented to person, place, and time.  Skin: Skin is warm and dry.  Psychiatric: He has a normal mood and affect. His behavior is normal.  Nursing note and vitals reviewed.   ED Course  Procedures (including critical care time) DIAGNOSTIC STUDIES: Oxygen Saturation is 98% on RA, normal by my interpretation.    COORDINATION OF CARE: 2:20 AM-Discussed treatment plan with pt at bedside and pt agreed to plan.     Labs Review Labs Reviewed - No data to display  Imaging Review No results found.   EKG Interpretation None      MDM   Final diagnoses:  None   Centor 0  Will treat for post nasal drip and ear pain.  Follow up with Dr. Elease Hashimoto for ongoing care.   I personally performed the services described in this documentation, which was scribed in my presence. The recorded information has been reviewed and is accurate.      Carlisle Beers, MD 01/23/14 [phone]

## 2014-01-23 NOTE — Telephone Encounter (Signed)
Pt stated that he is taking the Hycodan. He got to coughing so bad on 01/22/14 he left work and went to ED. Notes are in chart. Pt states that he is feeling better.

## 2014-01-23 NOTE — ED Notes (Signed)
Pt. reports left ear ache onset 2 weeks ago , denies injury / no drainage .

## 2014-01-29 ENCOUNTER — Ambulatory Visit: Payer: Self-pay | Admitting: Family Medicine

## 2014-02-21 ENCOUNTER — Telehealth: Payer: Self-pay | Admitting: Family Medicine

## 2014-02-21 NOTE — Telephone Encounter (Signed)
Refill once 

## 2014-02-21 NOTE — Telephone Encounter (Signed)
Pt would like a refill on hycodan cough syrup call into [facility] [address]

## 2014-02-21 NOTE — Telephone Encounter (Signed)
Pt states that he still has a cough it gets worse at night. Pt states that he is feeling better, no fever, and still has a runny nose.

## 2014-02-22 MED ORDER — HYDROCODONE-HOMATROPINE 5-1.5 MG/5ML PO SYRP: 5.0000 mL | ORAL_SOLUTION | Freq: Four times a day (QID) | ORAL | Status: DC | PRN

## 2014-02-22 NOTE — Telephone Encounter (Signed)
Pt is aware that RX is ready for pick up. 

## 2014-06-28 ENCOUNTER — Encounter: Payer: Self-pay | Admitting: Gastroenterology

## 2014-09-17 ENCOUNTER — Other Ambulatory Visit: Payer: Self-pay | Admitting: Family Medicine

## 2014-11-12 ENCOUNTER — Other Ambulatory Visit (INDEPENDENT_AMBULATORY_CARE_PROVIDER_SITE_OTHER): Payer: 59

## 2014-11-12 DIAGNOSIS — Z0000 Encounter for general adult medical examination without abnormal findings: Secondary | ICD-10-CM

## 2014-11-12 LAB — TSH: TSH: 1.46 u[IU]/mL (ref 0.35–4.50)

## 2014-11-12 LAB — HEPATIC FUNCTION PANEL
ALBUMIN: 4.2 g/dL (ref 3.5–5.2)
ALK PHOS: 100 U/L (ref 39–117)
ALT: 25 U/L (ref 0–53)
AST: 24 U/L (ref 0–37)
Bilirubin, Direct: 0.1 mg/dL (ref 0.0–0.3)
TOTAL PROTEIN: 7 g/dL (ref 6.0–8.3)
Total Bilirubin: 0.7 mg/dL (ref 0.2–1.2)

## 2014-11-12 LAB — CBC WITH DIFFERENTIAL/PLATELET
BASOS ABS: 0 10*3/uL (ref 0.0–0.1)
Basophils Relative: 0.3 % (ref 0.0–3.0)
EOS PCT: 1.9 % (ref 0.0–5.0)
Eosinophils Absolute: 0.2 10*3/uL (ref 0.0–0.7)
HEMATOCRIT: 44.7 % (ref 39.0–52.0)
HEMOGLOBIN: 14.6 g/dL (ref 13.0–17.0)
LYMPHS PCT: 33.7 % (ref 12.0–46.0)
Lymphs Abs: 2.9 10*3/uL (ref 0.7–4.0)
MCHC: 32.7 g/dL (ref 30.0–36.0)
MCV: 88.2 fl (ref 78.0–100.0)
MONOS PCT: 8.9 % (ref 3.0–12.0)
Monocytes Absolute: 0.8 10*3/uL (ref 0.1–1.0)
Neutro Abs: 4.7 10*3/uL (ref 1.4–7.7)
Neutrophils Relative %: 55.2 % (ref 43.0–77.0)
Platelets: 260 10*3/uL (ref 150.0–400.0)
RBC: 5.07 Mil/uL (ref 4.22–5.81)
RDW: 14.8 % (ref 11.5–15.5)
WBC: 8.6 10*3/uL (ref 4.0–10.5)

## 2014-11-12 LAB — BASIC METABOLIC PANEL
BUN: 10 mg/dL (ref 6–23)
CALCIUM: 9.4 mg/dL (ref 8.4–10.5)
CO2: 29 mEq/L (ref 19–32)
Chloride: 104 mEq/L (ref 96–112)
Creatinine, Ser: 0.84 mg/dL (ref 0.40–1.50)
GFR: 121.44 mL/min (ref >60.00)
GLUCOSE: 93 mg/dL (ref 70–99)
Potassium: 4.3 mEq/L (ref 3.5–5.1)
SODIUM: 142 meq/L (ref 135–145)

## 2014-11-12 LAB — LIPID PANEL
CHOLESTEROL: 193 mg/dL (ref 0–200)
HDL: 46.5 mg/dL (ref >39.00)
LDL Cholesterol: 115 mg/dL — ABNORMAL HIGH (ref 0–99)
NonHDL: 146.79
Total CHOL/HDL Ratio: 4
Triglycerides: 161 mg/dL — ABNORMAL HIGH (ref 0.0–149.0)
VLDL: 32.2 mg/dL (ref 0.0–40.0)

## 2014-11-12 LAB — PSA: PSA: 1.35 ng/mL (ref 0.10–4.00)

## 2014-11-21 ENCOUNTER — Encounter: Payer: Self-pay | Admitting: Family Medicine

## 2014-11-21 ENCOUNTER — Ambulatory Visit (INDEPENDENT_AMBULATORY_CARE_PROVIDER_SITE_OTHER): Payer: 59 | Admitting: Family Medicine

## 2014-11-21 VITALS — BP 180/120 | HR 104 | Temp 98.3°F | Resp 16 | Ht 69.25 in | Wt 184.3 lb

## 2014-11-21 DIAGNOSIS — Z0000 Encounter for general adult medical examination without abnormal findings: Secondary | ICD-10-CM

## 2014-11-21 MED ORDER — LISINOPRIL-HYDROCHLOROTHIAZIDE 20-12.5 MG PO TABS: 1.0000 | ORAL_TABLET | Freq: Every day | ORAL | Status: DC

## 2014-11-21 MED ORDER — AMLODIPINE BESYLATE 10 MG PO TABS: 10.0000 mg | ORAL_TABLET | Freq: Every day | ORAL | Status: DC

## 2014-11-21 NOTE — Progress Notes (Signed)
Pre visit review using our clinic review tool, if applicable. No additional management support is needed unless otherwise documented below in the visit note. 

## 2014-11-21 NOTE — Progress Notes (Signed)
   Subjective:    Patient ID: Benjamin Hines, male    DOB: 1960-03-14, 54 y.o.   MRN: [id number]  HPI Patient here for complete physical. He quit smoking 8 months ago. He has hypertension but unfortunately ran out of his medications about one week ago and has had absolutely none since then. His blood pressure is extremely high today. This has been well controlled when he has taken his medications in the past. Colonoscopy 2010 with benign polyps. Tetanus up-to-date. Declines flu vaccine. He has gained some weight since he quit smoking 8 months ago.  He does have estimated 30 pack year history. Denies any recent cough or dyspnea  Past Medical History  Diagnosis Date  . Hypertension    Past Surgical History  Procedure Laterality Date  . Foot surgery      left foot    reports that he has been smoking.  He does not have any smokeless tobacco history on file. He reports that he does not drink alcohol or use illicit drugs. family history is not on file. No Known Allergies    Review of Systems  Constitutional: Negative for fever, activity change, appetite change, fatigue and unexpected weight change.  HENT: Negative for congestion, ear pain and trouble swallowing.   Eyes: Negative for pain and visual disturbance.  Respiratory: Negative for cough, shortness of breath and wheezing.   Cardiovascular: Negative for chest pain and palpitations.  Gastrointestinal: Negative for nausea, vomiting, abdominal pain, diarrhea, constipation, blood in stool, abdominal distention and rectal pain.  Genitourinary: Negative for dysuria, hematuria and testicular pain.  Musculoskeletal: Positive for back pain and arthralgias. Negative for joint swelling.  Skin: Negative for rash.  Neurological: Negative for dizziness, syncope and headaches.  Hematological: Negative for adenopathy.  Psychiatric/Behavioral: Negative for confusion and dysphoric mood.       Objective:   Physical Exam  Constitutional: He is  oriented to person, place, and time. He appears well-developed and well-nourished. No distress.  HENT:  Head: Normocephalic and atraumatic.  Right Ear: External ear normal.  Left Ear: External ear normal.  Mouth/Throat: Oropharynx is clear and moist.  Eyes: Conjunctivae and EOM are normal. Pupils are equal, round, and reactive to light.  Neck: Normal range of motion. Neck supple. No thyromegaly present.  Cardiovascular: Normal rate, regular rhythm and normal heart sounds.   No murmur heard. Pulmonary/Chest: No respiratory distress. He has no wheezes. He has no rales.  Abdominal: Soft. Bowel sounds are normal. He exhibits no distension and no mass. There is no tenderness. There is no rebound and no guarding.  Musculoskeletal: He exhibits no edema.  Lymphadenopathy:    He has no cervical adenopathy.  Neurological: He is alert and oriented to person, place, and time. He displays normal reflexes. No cranial nerve deficit.  Skin: No rash noted.  Psychiatric: He has a normal mood and affect.          Assessment & Plan:  Complete physical. Blood pressure is extremely elevated today but he has not been on his medication for one week. Refill medications. Monitor blood pressure closely. Be in touch if he is not seeing consistent readings below 140/90 in 2 weeks back on medication. We discussed low-dose CT lung cancer screening and gave him information. He will check on insurance coverage and let us know if interested. Flu vaccine recommended but declined.

## 2014-11-21 NOTE — Patient Instructions (Signed)
Get back on your BP medications as soon as possible Let me know in two weeks if your BP is not < 140/90.

## 2014-11-26 ENCOUNTER — Telehealth: Payer: Self-pay | Admitting: Family Medicine

## 2014-11-26 NOTE — Telephone Encounter (Signed)
Would need to check UA.

## 2014-11-26 NOTE — Telephone Encounter (Signed)
Please review. He was seen for CPE on 11/9.

## 2014-11-26 NOTE — Telephone Encounter (Signed)
Patient thinks he does have a kidney infection. Would like to know if you will call in a Rx for him or if he needs to come back in and be seen.

## 2014-11-27 NOTE — Telephone Encounter (Signed)
Pt has been scheduled.  °

## 2014-11-27 NOTE — Telephone Encounter (Signed)
Please call pt and schedule appt to evaluate urinary symptoms.

## 2014-12-03 ENCOUNTER — Encounter: Payer: Self-pay | Admitting: Family Medicine

## 2014-12-03 ENCOUNTER — Ambulatory Visit (INDEPENDENT_AMBULATORY_CARE_PROVIDER_SITE_OTHER): Payer: 59 | Admitting: Family Medicine

## 2014-12-03 VITALS — BP 120/90 | HR 94 | Temp 98.4°F | Resp 14 | Ht 69.25 in | Wt 185.3 lb

## 2014-12-03 DIAGNOSIS — M545 Low back pain, unspecified: Secondary | ICD-10-CM

## 2014-12-03 DIAGNOSIS — R202 Paresthesia of skin: Secondary | ICD-10-CM | POA: Diagnosis not present

## 2014-12-03 DIAGNOSIS — I10 Essential (primary) hypertension: Secondary | ICD-10-CM | POA: Diagnosis not present

## 2014-12-03 LAB — VITAMIN B12: VITAMIN B 12: 407 pg/mL (ref 211–911)

## 2014-12-03 LAB — SEDIMENTATION RATE: SED RATE: 9 mm/h (ref 0–22)

## 2014-12-03 NOTE — Progress Notes (Addendum)
   Subjective:    Patient ID: Benjamin Hines, male    DOB: [birth year], 54 y.o.   MRN: [id number]  HPI Patient seen for the following  Severe hypertension. He had reading of 180/100 recently but had been out of his medications for 1 week. He is now back on amlodipine and lisinopril HCTZ. Blood pressures been much better during the past couple weeks. No headaches. No dizziness. No chest pains.  Bilateral paresthesias hands and feet. No history of diabetes. Recent TSH normal. He has sensory neuropathy but no associated pain and no clear weakness. Symptoms are intermittent. He denies any appetite or weight changes. No specific risk factors for B12 deficiency.  Chronic low back pain. No radiculopathy symptoms. Denies any fever, chills, appetite or weight changes, or night sweats. No urinary symptoms.  Past Medical History  Diagnosis Date  . Hypertension    Past Surgical History  Procedure Laterality Date  . Foot surgery      left foot    reports that he has quit smoking. He does not have any smokeless tobacco history on file. He reports that he does not drink alcohol or use illicit drugs. family history is not on file. No Known Allergies    Review of Systems  Constitutional: Negative for fatigue.  Eyes: Negative for visual disturbance.  Respiratory: Negative for cough, chest tightness and shortness of breath.   Cardiovascular: Negative for chest pain, palpitations and leg swelling.  Endocrine: Negative for polydipsia and polyuria.  Musculoskeletal: Positive for back pain.  Neurological: Positive for numbness. Negative for dizziness, syncope, weakness, light-headedness and headaches.  Hematological: Negative for adenopathy. Does not bruise/bleed easily.       Objective:   Physical Exam  Constitutional: He is oriented to person, place, and time. He appears well-developed and well-nourished.  HENT:  Right Ear: External ear normal.  Left Ear: External ear normal.  Mouth/Throat:  Oropharynx is clear and moist.  Eyes: Pupils are equal, round, and reactive to light.  Neck: Neck supple. No thyromegaly present.  Cardiovascular: Normal rate and regular rhythm.   Pulmonary/Chest: Effort normal and breath sounds normal. No respiratory distress. He has no wheezes. He has no rales.  Musculoskeletal: He exhibits no edema.  Neurological: He is alert and oriented to person, place, and time.  Romberg normal. Full strength upper and lower extremities. He has somewhat diminished reflexes throughout upper and lower extremities Normal sensory function to touch and monofilament          Assessment & Plan:  #1 hypertension. Greatly improved back on medications. Stressed importance of full compliance at all times #2 paresthesias involving upper lower extremities. These are bilateral. This appears to be mostly a sensory issue. He does not complain of any weakness or pain. Check B12 level, sedimentation rate, SPEP. Recent TSH and other labs normal. No history of diabetes. Consider neurology referral if symptoms persist

## 2014-12-07 LAB — SPEP & IFE WITH QIG
ALPHA-2-GLOBULIN: 0.7 g/dL (ref 0.5–0.9)
Albumin ELP: 4.2 g/dL (ref 3.8–4.8)
Alpha-1-Globulin: 0.4 g/dL — ABNORMAL HIGH (ref 0.2–0.3)
Beta 2: 0.3 g/dL (ref 0.2–0.5)
Beta Globulin: 0.4 g/dL (ref 0.4–0.6)
Gamma Globulin: 1.1 g/dL (ref 0.8–1.7)
IgA: 119 mg/dL (ref 68–379)
IgG (Immunoglobin G), Serum: 1390 mg/dL (ref 650–1600)
IgM, Serum: 48 mg/dL (ref 41–251)
TOTAL PROTEIN, SERUM ELECTROPHOR: 7.2 g/dL (ref 6.1–8.1)

## 2015-02-14 ENCOUNTER — Ambulatory Visit (INDEPENDENT_AMBULATORY_CARE_PROVIDER_SITE_OTHER): Payer: Self-pay | Admitting: Family Medicine

## 2015-02-14 ENCOUNTER — Encounter: Payer: Self-pay | Admitting: Family Medicine

## 2015-02-14 VITALS — BP 130/100 | Temp 98.0°F | Ht 69.25 in | Wt 188.2 lb

## 2015-02-14 DIAGNOSIS — M5416 Radiculopathy, lumbar region: Secondary | ICD-10-CM

## 2015-02-14 MED ORDER — PREDNISONE 10 MG PO TABS: ORAL_TABLET | ORAL | Status: DC

## 2015-02-14 NOTE — Progress Notes (Signed)
   Subjective:    Patient ID: Benjamin Hines, male    DOB: [birth year], 55 y.o.   MRN: [id number]  HPI  patient seen with left lumbar back pain. He's had pain now for several months. Pain radiates from the left lower lumbar area and described as a dull ache moderate severity at times. He has some numbness involving his left anterior thigh but no weakness. He does have occasional pains that radiate below the left knee. Ice helps. Occasionally takes over-the-counter anti-inflammatories. No prior history of back surgery. No appetite or weight changes. No dysuria. No fevers or chills.  Past Medical History  Diagnosis Date  . Hypertension    Past Surgical History  Procedure Laterality Date  . Foot surgery      left foot    reports that he has quit smoking. He does not have any smokeless tobacco history on file. He reports that he does not drink alcohol or use illicit drugs. family history is not on file. No Known Allergies    Review of Systems  Constitutional: Negative for fever, chills, appetite change and unexpected weight change.  Cardiovascular: Negative for chest pain.  Gastrointestinal: Negative for abdominal pain.  Musculoskeletal: Positive for back pain.  Neurological: Positive for numbness. Negative for weakness.       Objective:   Physical Exam  Constitutional: He appears well-developed and well-nourished.  Cardiovascular: Normal rate and regular rhythm.   Pulmonary/Chest: Effort normal and breath sounds normal. No respiratory distress. He has no wheezes. He has no rales.  Musculoskeletal:  Left straight leg raise is negative. No leg edema  Neurological:  Full-strength lower extremities. Normal sensory function to touch. Trace reflexes knee and 1+ Achilles bilaterally.          Assessment & Plan:  Patient has left lumbar radiculitis symptoms. Nonfocal exam neurologically. Try prednisone taper. Continue ice. May need imaging to further assess if symptoms persist. He  currently does not have insurance coverage and wishes to wait. Follow-up promptly for any weakness or worsening symptoms

## 2015-02-14 NOTE — Patient Instructions (Signed)
Lumbosacral Radiculopathy °Lumbosacral radiculopathy is a condition that involves the spinal nerves and nerve roots in the low back and bottom of the spine. The condition develops when these nerves and nerve roots move out of place or become inflamed and cause symptoms. °CAUSES °This condition may be caused by: °· Pressure from a disk that bulges out of place (herniated disk). A disk is a plate of cartilage that separates bones in the spine. °· Disk degeneration. °· A narrowing of the bones of the lower back (spinal stenosis). °· A tumor. °· An infection. °· An injury that places sudden pressure on the disks that cushion the bones of your lower spine. °RISK FACTORS °This condition is more likely to develop in: °· Males aged 30-50 years. °· Females aged 50-60 years. °· People who lift improperly. °· People who are overweight or live a sedentary lifestyle. °· People who smoke. °· People who perform repetitive activities that strain the spine. °SYMPTOMS °Symptoms of this condition include: °· Pain that goes down from the back into the legs (sciatica). This is the most common symptom. The pain may be worse with sitting, coughing, or sneezing. °· Pain and numbness in the arms and legs. °· Muscle weakness. °· Tingling. °· Loss of bladder control or bowel control. °DIAGNOSIS °This condition is diagnosed with a physical exam and medical history. If the pain is lasting, you may have tests, such as: °· MRI scan. °· X-ray. °· CT scan. °· Myelogram. °· Nerve conduction study. °TREATMENT °This condition is often treated with: °· Hot packs and ice applied to affected areas. °· Stretches to improve flexibility. °· Exercises to strengthen back muscles. °· Physical therapy. °· Pain medicine. °· A steroid injection in the spine. °In some cases, no treatment is needed. If the condition is long-lasting (chronic), or if symptoms are severe, treatment may involve surgery or lifestyle changes, such as following a weight loss plan. °HOME  CARE INSTRUCTIONS °Medicines °· Take medicines only as directed by your health care provider. °· Do not drive or operate heavy machinery while taking pain medicine. °Injury Care °· Apply a heat pack to the injured area as directed by your health care provider. °· Apply ice to the affected area: °¨ Put ice in a plastic bag. °¨ Place a towel between your skin and the bag. °¨ Leave the ice on for 20-30 minutes, every 2 hours while you are awake or as needed. Or, leave the ice on for as long as directed by your health care provider. °Other Instructions °· If you were shown how to do any exercises or stretches, do them as directed by your health care provider. °· If your health care provider prescribed a diet or exercise program, follow it as directed. °· Keep all follow-up visits as directed by your health care provider. This is important. °SEEK MEDICAL CARE IF: °· Your pain does not improve over time even when taking pain medicines. °SEEK IMMEDIATE MEDICAL CARE IF: °· Your develop severe pain. °· Your pain suddenly gets worse. °· You develop increasing weakness in your legs. °· You lose the ability to control your bladder or bowel. °· You have difficulty walking or balancing. °· You have a fever. °  °This information is not intended to replace advice given to you by your health care provider. Make sure you discuss any questions you have with your health care provider. °  °Document Released: 12/29/2004 Document Revised: 05/15/2014 Document Reviewed: 12/25/2013 °Elsevier Interactive Patient Education ©2016 [employer] ° °

## 2015-02-14 NOTE — Progress Notes (Signed)
Pre visit review using our clinic review tool, if applicable. No additional management support is needed unless otherwise documented below in the visit note. 

## 2016-07-21 ENCOUNTER — Ambulatory Visit: Payer: Self-pay | Admitting: Family Medicine

## 2017-02-02 ENCOUNTER — Ambulatory Visit: Payer: Self-pay | Admitting: *Deleted

## 2017-02-02 NOTE — Telephone Encounter (Signed)
Pt is calling because he stopped smoking and has gained weight to the point where he is short of breathe. He gets a little lightheaded at times. No other symptoms reported. He wants an appointment to run tests and make sure he is ok.  Home care advice given with verbal understanding. Appointment made.  Reason for Disposition . [1] MODERATE longstanding difficulty breathing (e.g., speaks in phrases, SOB even at rest, pulse 100-120) AND [2] SAME as normal  Answer Assessment - Initial Assessment Questions 1. RESPIRATORY STATUS: "Describe your breathing?" (e.g., wheezing, shortness of breath, unable to speak, severe coughing)      Out of breathe 2. ONSET: "When did this breathing problem begin?"      Started the middle of last year 3. PATTERN "Does the difficult breathing come and go, or has it been constant since it started?"      Now all the time 4. SEVERITY: "How bad is your breathing?" (e.g., mild, moderate, severe)    - MILD: No SOB at rest, mild SOB with walking, speaks normally in sentences, can lay down, no retractions, pulse < 100.    - MODERATE: SOB at rest, SOB with minimal exertion and prefers to sit, cannot lie down flat, speaks in phrases, mild retractions, audible wheezing, pulse 100-120.    - SEVERE: Very SOB at rest, speaks in single words, struggling to breathe, sitting hunched forward, retractions, pulse > 120      moderate 5. RECURRENT SYMPTOM: "Have you had difficulty breathing before?" If so, ask: "When was the last time?" and "What happened that time?"      no 6. CARDIAC HISTORY: "Do you have any history of heart disease?" (e.g., heart attack, angina, bypass surgery, angioplasty)      No, just htn 7. LUNG HISTORY: "Do you have any history of lung disease?"  (e.g., pulmonary embolus, asthma, emphysema)     no 8. CAUSE: "What do you think is causing the breathing problem?"      Extra weight but not sure 9. OTHER SYMPTOMS: "Do you have any other symptoms? (e.g., dizziness,  runny nose, cough, chest pain, fever)     Sometimes gets a little lightheaded, cough comes and goes 10. PREGNANCY: "Is there any chance you are pregnant?" "When was your last menstrual period?"       n/a 11. TRAVEL: "Have you traveled out of the country in the last month?" (e.g., travel history, exposures)       no  Protocols used: BREATHING DIFFICULTY-A-AH

## 2017-02-03 ENCOUNTER — Encounter: Payer: Self-pay | Admitting: Family Medicine

## 2017-02-03 ENCOUNTER — Ambulatory Visit: Payer: BLUE CROSS/BLUE SHIELD | Admitting: Family Medicine

## 2017-02-03 ENCOUNTER — Ambulatory Visit (INDEPENDENT_AMBULATORY_CARE_PROVIDER_SITE_OTHER)
Admission: RE | Admit: 2017-02-03 | Discharge: 2017-02-03 | Disposition: A | Discharge status: Home / Self Care | Payer: BLUE CROSS/BLUE SHIELD | Source: Ambulatory Visit | Attending: Family Medicine | Admitting: Family Medicine

## 2017-02-03 VITALS — BP 215/120 | HR 93 | Temp 98.4°F | Wt 186.7 lb

## 2017-02-03 DIAGNOSIS — R0600 Dyspnea, unspecified: Secondary | ICD-10-CM

## 2017-02-03 DIAGNOSIS — R0609 Other forms of dyspnea: Secondary | ICD-10-CM | POA: Diagnosis not present

## 2017-02-03 DIAGNOSIS — I10 Essential (primary) hypertension: Secondary | ICD-10-CM

## 2017-02-03 LAB — CBC WITH DIFFERENTIAL/PLATELET
Basophils Absolute: 0 10*3/uL (ref 0.0–0.1)
Basophils Relative: 0.5 % (ref 0.0–3.0)
Eosinophils Absolute: 0.1 10*3/uL (ref 0.0–0.7)
Eosinophils Relative: 1.3 % (ref 0.0–5.0)
HCT: 46.5 % (ref 39.0–52.0)
Hemoglobin: 15.3 g/dL (ref 13.0–17.0)
Lymphocytes Relative: 31.3 % (ref 12.0–46.0)
Lymphs Abs: 2.6 10*3/uL (ref 0.7–4.0)
MCHC: 33 g/dL (ref 30.0–36.0)
MCV: 90.6 fl (ref 78.0–100.0)
MONO ABS: 0.7 10*3/uL (ref 0.1–1.0)
MONOS PCT: 7.9 % (ref 3.0–12.0)
NEUTROS ABS: 4.9 10*3/uL (ref 1.4–7.7)
Neutrophils Relative %: 59 % (ref 43.0–77.0)
Platelets: 276 10*3/uL (ref 150.0–400.0)
RBC: 5.13 Mil/uL (ref 4.22–5.81)
RDW: 13.5 % (ref 11.5–15.5)
WBC: 8.4 10*3/uL (ref 4.0–10.5)

## 2017-02-03 LAB — BASIC METABOLIC PANEL
BUN: 14 mg/dL (ref 6–23)
CALCIUM: 9.5 mg/dL (ref 8.4–10.5)
CO2: 23 meq/L (ref 19–32)
CREATININE: 0.95 mg/dL (ref 0.40–1.50)
Chloride: 104 mEq/L (ref 96–112)
GFR: 104.53 mL/min (ref >60.00)
GLUCOSE: 90 mg/dL (ref 70–99)
Potassium: 4.6 mEq/L (ref 3.5–5.1)
Sodium: 142 mEq/L (ref 135–145)

## 2017-02-03 LAB — LIPID PANEL
CHOLESTEROL: 231 mg/dL — AB (ref 0–200)
HDL: 59.4 mg/dL (ref >39.00)
LDL Cholesterol: 139 mg/dL — ABNORMAL HIGH (ref 0–99)
NONHDL: 171.4
Total CHOL/HDL Ratio: 4
Triglycerides: 160 mg/dL — ABNORMAL HIGH (ref 0.0–149.0)
VLDL: 32 mg/dL (ref 0.0–40.0)

## 2017-02-03 LAB — HEPATIC FUNCTION PANEL
ALK PHOS: 99 U/L (ref 39–117)
ALT: 20 U/L (ref 0–53)
AST: 24 U/L (ref 0–37)
Albumin: 4.4 g/dL (ref 3.5–5.2)
BILIRUBIN TOTAL: 0.5 mg/dL (ref 0.2–1.2)
Bilirubin, Direct: 0.1 mg/dL (ref 0.0–0.3)
Total Protein: 7.2 g/dL (ref 6.0–8.3)

## 2017-02-03 LAB — BRAIN NATRIURETIC PEPTIDE: Pro B Natriuretic peptide (BNP): 164 pg/mL — ABNORMAL HIGH (ref 0.0–100.0)

## 2017-02-03 MED ORDER — LISINOPRIL-HYDROCHLOROTHIAZIDE 20-12.5 MG PO TABS: 1.0000 | ORAL_TABLET | Freq: Every day | ORAL | 3 refills | Status: DC

## 2017-02-03 MED ORDER — AMLODIPINE BESYLATE 10 MG PO TABS: 10.0000 mg | ORAL_TABLET | Freq: Every day | ORAL | 3 refills | Status: DC

## 2017-02-03 NOTE — Patient Instructions (Signed)
Get back on BP meds TODAY Try cut back on salt intake  Go for CXR as discussed We are setting up Cardiology referral. Scale back beer to no more than 2 per day.     DASH Eating Plan DASH stands for "Dietary Approaches to Stop Hypertension." The DASH eating plan is a healthy eating plan that has been shown to reduce high blood pressure (hypertension). It may also reduce your risk for type 2 diabetes, heart disease, and stroke. The DASH eating plan may also help with weight loss. What are tips for following this plan? General guidelines  Avoid eating more than 2,300 mg (milligrams) of salt (sodium) a day. If you have hypertension, you may need to reduce your sodium intake to 1,500 mg a day.  Limit alcohol intake to no more than 1 drink a day for nonpregnant women and 2 drinks a day for men. One drink equals 12 oz of beer, 5 oz of wine, or 1 oz of hard liquor.  Work with your health care provider to maintain a healthy body weight or to lose weight. Ask what an ideal weight is for you.  Get at least 30 minutes of exercise that causes your heart to beat faster (aerobic exercise) most days of the week. Activities may include walking, swimming, or biking.  Work with your health care provider or diet and nutrition specialist (dietitian) to adjust your eating plan to your individual calorie needs. Reading food labels  Check food labels for the amount of sodium per serving. Choose foods with less than 5 percent of the Daily Value of sodium. Generally, foods with less than 300 mg of sodium per serving fit into this eating plan.  To find whole grains, look for the word "whole" as the first word in the ingredient list. Shopping  Buy products labeled as "low-sodium" or "no salt added."  Buy fresh foods. Avoid canned foods and premade or frozen meals. Cooking  Avoid adding salt when cooking. Use salt-free seasonings or herbs instead of table salt or sea salt. Check with your health care provider or  pharmacist before using salt substitutes.  Do not fry foods. Cook foods using healthy methods such as baking, boiling, grilling, and broiling instead.  Cook with heart-healthy oils, such as olive, canola, soybean, or sunflower oil. Meal planning   Eat a balanced diet that includes: ? 5 or more servings of fruits and vegetables each day. At each meal, try to fill half of your plate with fruits and vegetables. ? Up to 6-8 servings of whole grains each day. ? Less than 6 oz of lean meat, poultry, or fish each day. A 3-oz serving of meat is about the same size as a deck of cards. One egg equals 1 oz. ? 2 servings of low-fat dairy each day. ? A serving of nuts, seeds, or beans 5 times each week. ? Heart-healthy fats. Healthy fats called Omega-3 fatty acids are found in foods such as flaxseeds and coldwater fish, like sardines, salmon, and mackerel.  Limit how much you eat of the following: ? Canned or prepackaged foods. ? Food that is high in trans fat, such as fried foods. ? Food that is high in saturated fat, such as fatty meat. ? Sweets, desserts, sugary drinks, and other foods with added sugar. ? Full-fat dairy products.  Do not salt foods before eating.  Try to eat at least 2 vegetarian meals each week.  Eat more home-cooked food and less restaurant, buffet, and fast food.  When eating at a restaurant, ask that your food be prepared with less salt or no salt, if possible. What foods are recommended? The items listed may not be a complete list. Talk with your dietitian about what dietary choices are best for you. Grains Whole-grain or whole-wheat bread. Whole-grain or whole-wheat pasta. Brown rice. [place]. Bulgur. Whole-grain and low-sodium cereals. Pita bread. Low-fat, low-sodium crackers. Whole-wheat flour tortillas. Vegetables Fresh or frozen vegetables (raw, steamed, roasted, or grilled). Low-sodium or reduced-sodium tomato and vegetable juice. Low-sodium or  reduced-sodium tomato sauce and tomato paste. Low-sodium or reduced-sodium canned vegetables. Fruits All fresh, dried, or frozen fruit. Canned fruit in natural juice (without added sugar). Meat and other protein foods Skinless chicken or [place]. Ground chicken or [place]. Pork with fat trimmed off. Fish and seafood. Egg whites. Dried beans, peas, or lentils. Unsalted nuts, nut butters, and seeds. Unsalted canned beans. Lean cuts of beef with fat trimmed off. Low-sodium, lean deli meat. Dairy Low-fat (1%) or fat-free (skim) milk. Fat-free, low-fat, or reduced-fat cheeses. Nonfat, low-sodium ricotta or cottage cheese. Low-fat or nonfat yogurt. Low-fat, low-sodium cheese. Fats and oils Soft margarine without trans fats. Vegetable oil. Low-fat, reduced-fat, or light mayonnaise and salad dressings (reduced-sodium). Canola, safflower, olive, soybean, and sunflower oils. Avocado. Seasoning and other foods Herbs. Spices. Seasoning mixes without salt. Unsalted popcorn and pretzels. Fat-free sweets. What foods are not recommended? The items listed may not be a complete list. Talk with your dietitian about what dietary choices are best for you. Grains Baked goods made with fat, such as croissants, muffins, or some breads. Dry pasta or rice meal packs. Vegetables Creamed or fried vegetables. Vegetables in a cheese sauce. Regular canned vegetables (not low-sodium or reduced-sodium). Regular canned tomato sauce and paste (not low-sodium or reduced-sodium). Regular tomato and vegetable juice (not low-sodium or reduced-sodium). Angie Fava. Olives. Fruits Canned fruit in a light or heavy syrup. Fried fruit. Fruit in cream or butter sauce. Meat and other protein foods Fatty cuts of meat. Ribs. Fried meat. Berniece Salines. Sausage. Bologna and other processed lunch meats. Salami. Fatback. Hotdogs. Bratwurst. Salted nuts and seeds. Canned beans with added salt. Canned or smoked fish. Whole eggs or egg yolks. Chicken or [place]  with skin. Dairy Whole or 2% milk, cream, and half-and-half. Whole or full-fat cream cheese. Whole-fat or sweetened yogurt. Full-fat cheese. Nondairy creamers. Whipped toppings. Processed cheese and cheese spreads. Fats and oils Butter. Stick margarine. Lard. Shortening. Ghee. Bacon fat. Tropical oils, such as coconut, palm kernel, or palm oil. Seasoning and other foods Salted popcorn and pretzels. Onion salt, garlic salt, seasoned salt, table salt, and sea salt. Worcestershire sauce. Tartar sauce. Barbecue sauce. Teriyaki sauce. Soy sauce, including reduced-sodium. Steak sauce. Canned and packaged gravies. Fish sauce. Oyster sauce. Cocktail sauce. Horseradish that you find on the shelf. Ketchup. Mustard. Meat flavorings and tenderizers. Bouillon cubes. Hot sauce and Tabasco sauce. Premade or packaged marinades. Premade or packaged taco seasonings. Relishes. Regular salad dressings. Where to find more information:  National Heart, Lung, and [place]: https://wilson-eaton.com/  American Heart Association: www.heart.org Summary  The DASH eating plan is a healthy eating plan that has been shown to reduce high blood pressure (hypertension). It may also reduce your risk for type 2 diabetes, heart disease, and stroke.  With the DASH eating plan, you should limit salt (sodium) intake to 2,300 mg a day. If you have hypertension, you may need to reduce your sodium intake to 1,500 mg a day.  When on the DASH eating plan,  aim to eat more fresh fruits and vegetables, whole grains, lean proteins, low-fat dairy, and heart-healthy fats.  Work with your health care provider or diet and nutrition specialist (dietitian) to adjust your eating plan to your individual calorie needs. This information is not intended to replace advice given to you by your health care provider. Make sure you discuss any questions you have with your health care provider. Document Released: 12/18/2010 Document Revised: 12/23/2015  Document Reviewed: 12/23/2015 Elsevier Interactive Patient Education  [employer].

## 2017-02-03 NOTE — Progress Notes (Signed)
Subjective:     Patient ID: MAKEL MCMANN, male   DOB: 1960-01-31, 57 y.o.   MRN: [id number]  HPI Patient has history of hypertension and GERD and is a former smoker who quit 2 years ago. He seen today with about 6 month history of some progressive dyspnea with activity. Denies any chest pain. He states that even with minimal activity such as bending over and mild exertion he has some shortness of breath. He has not noted any peripheral edema. No orthopnea. He had nuclear stress test September 2015 with no ischemia.  Occasional dry cough. No hemoptysis. No appetite or weight changes. No recent headaches. Hypertension treated with amlodipine and lisinopril HCTZ. He ran out of his medication a few days ago. History of poor compliance in the past. No recent lab work.  He states other than the exertional dyspnea he feels "fine". His never had chest pain. No dizziness.  Poor diet. Eats lots of fast food. High sodium intake. Also drinks about 3 beers per day and sometimes wine in addition.  Past Medical History:  Diagnosis Date  . Hypertension    Past Surgical History:  Procedure Laterality Date  . FOOT SURGERY     left foot    reports that he has quit smoking. He smoked 1.00 pack per day. he has never used smokeless tobacco. He reports that he does not drink alcohol or use drugs. family history is not on file. No Known Allergies   Review of Systems  Constitutional: Negative for appetite change, chills, fatigue, fever and unexpected weight change.  HENT: Negative for congestion.   Eyes: Negative for visual disturbance.  Respiratory: Positive for cough and shortness of breath. Negative for chest tightness and wheezing.   Cardiovascular: Negative for chest pain, palpitations and leg swelling.  Gastrointestinal: Negative for abdominal pain.  Genitourinary: Negative for dysuria.  Neurological: Negative for dizziness, syncope, weakness, light-headedness and headaches.  Hematological:  Negative for adenopathy.       Objective:   Physical Exam  Constitutional: He is oriented to person, place, and time. He appears well-developed and well-nourished.  Neck: Neck supple.  Cardiovascular: Normal rate.  Pulmonary/Chest: Effort normal and breath sounds normal. No respiratory distress. He has no wheezes. He has no rales.  Musculoskeletal: He exhibits no edema.  Lymphadenopathy:    He has no cervical adenopathy.  Neurological: He is alert and oriented to person, place, and time.       Assessment:     #1 severe hypertension with systolic reading today over 712 and diastolic over 458. Recent poor compliance with medication.  Poor compliance with diet with high sodium intake and increased alcohol intake which are likely exacerbating  #2 dyspnea with exertion.. No evidence for overt pulmonary edema. Long history of smoking and probably has some COPD but source of dyspnea not clear at this time.  EKG shows changes compatible with probable LVH and T wave inversions lateral leads which may be related to LVH.  Needs further cardiac evaluation  #3 EKG changes with T-wave inversions lateral leads and probable LVH    Plan:     -obtain EKG-findings as above -Check labs with basic metabolic panel, lipid panel, hepatic panel, CBC, BNP level -Get chest x-ray -Start back amlodipine and lisinopril HCTZ today -Follow-up in a few days to recheck blood pressure -Information given on DASH diet. Scale back sodium intake. Scale back alcohol consumption -Set up cardiology referral -Over 40 minutes spent with patient of which greater than 50% in  direct evaluation and assessment and counseling regarding hypertension, medications, lifestyle management, and discussing further evaluation with cardiology  Eulas Post MD [facility] at [facility]

## 2017-02-05 ENCOUNTER — Other Ambulatory Visit: Payer: Self-pay | Admitting: Family Medicine

## 2017-02-05 DIAGNOSIS — E785 Hyperlipidemia, unspecified: Secondary | ICD-10-CM

## 2017-02-05 MED ORDER — ATORVASTATIN CALCIUM 20 MG PO TABS: 20.0000 mg | ORAL_TABLET | Freq: Every day | ORAL | 0 refills | Status: DC

## 2017-02-09 ENCOUNTER — Encounter: Payer: Self-pay | Admitting: Family Medicine

## 2017-02-09 ENCOUNTER — Ambulatory Visit: Payer: BLUE CROSS/BLUE SHIELD | Admitting: Family Medicine

## 2017-02-09 VITALS — BP 132/98 | HR 92 | Temp 98.3°F | Wt 186.1 lb

## 2017-02-09 DIAGNOSIS — I10 Essential (primary) hypertension: Secondary | ICD-10-CM | POA: Diagnosis not present

## 2017-02-09 DIAGNOSIS — F17200 Nicotine dependence, unspecified, uncomplicated: Secondary | ICD-10-CM

## 2017-02-09 DIAGNOSIS — E785 Hyperlipidemia, unspecified: Secondary | ICD-10-CM | POA: Insufficient documentation

## 2017-02-09 NOTE — Patient Instructions (Signed)
Let me know if you have not heard from cardiology in 2 weeks.

## 2017-02-09 NOTE — Progress Notes (Signed)
Subjective:     Patient ID: Benjamin Hines, male   DOB: Apr 14, 1960, 57 y.o.   MRN: [id number]  HPI Patient here for follow-up regarding hypertension and recent dyspnea. Refer to recent note. He has history of hypertension and had basically run out of all his medications.  He noticed some dyspnea when bending over and also with mild exertion. No chest pain. EKG showed some changes possibly compatible with LVH. Chest x-ray showed no acute abnormality. BNP level 164. He had elevated lipids. Chemistries were stable.  Started back his amlodipine and lisinopril HCTZ along with atorvastatin. He feels much better today. Much less dyspnea. Cardiology referral has been made and patient is waiting to hear back at this point.  Has not any recent peripheral edema.  Past Medical History:  Diagnosis Date  . Hypertension    Past Surgical History:  Procedure Laterality Date  . FOOT SURGERY     left foot    reports that he has quit smoking. He smoked 1.00 pack per day. he has never used smokeless tobacco. He reports that he does not drink alcohol or use drugs. family history is not on file. No Known Allergies   Review of Systems  Constitutional: Negative for fatigue.  Eyes: Negative for visual disturbance.  Respiratory: Negative for cough, chest tightness, shortness of breath and wheezing.   Cardiovascular: Negative for chest pain, palpitations and leg swelling.  Endocrine: Negative for polydipsia and polyuria.  Neurological: Negative for dizziness, syncope, weakness, light-headedness and headaches.       Objective:   Physical Exam  Constitutional: He is oriented to person, place, and time. He appears well-developed and well-nourished.  HENT:  Right Ear: External ear normal.  Left Ear: External ear normal.  Mouth/Throat: Oropharynx is clear and moist.  Eyes: Pupils are equal, round, and reactive to light.  Neck: Neck supple. No thyromegaly present.  Cardiovascular: Normal rate and regular  rhythm.  Pulmonary/Chest: Effort normal and breath sounds normal. No respiratory distress. He has no wheezes. He has no rales.  Musculoskeletal: He exhibits no edema.  Neurological: He is alert and oriented to person, place, and time.       Assessment:     #1 severe hypertension greatly improved with getting back on medication  #2 dyspnea improved with improvement in blood pressure. Probable left ventricular strain pattern on recent EKG.    #3 dyslipidemia  #4 ongoing nicotine use    Plan:     -is encouraged again to stop smoking -Recent initiation of Lipitor and we'll plan to recheck lipids and hepatic panel in 2 months -Continue amlodipine and lisinopril HCTZ -Pending cardiology referral as above. Suspect he will need echocardiogram to further assess -DASH diet discussed -Routine follow-up in 2 months to reassess and sooner as needed  Eulas Post MD [facility] at [facility]

## 2017-02-15 ENCOUNTER — Ambulatory Visit: Payer: BLUE CROSS/BLUE SHIELD | Admitting: Cardiology

## 2017-02-15 ENCOUNTER — Encounter: Payer: Self-pay | Admitting: Cardiology

## 2017-02-15 VITALS — BP 146/100 | HR 88 | Ht 69.0 in | Wt 187.2 lb

## 2017-02-15 DIAGNOSIS — I10 Essential (primary) hypertension: Secondary | ICD-10-CM

## 2017-02-15 DIAGNOSIS — R0600 Dyspnea, unspecified: Secondary | ICD-10-CM

## 2017-02-15 DIAGNOSIS — E7800 Pure hypercholesterolemia, unspecified: Secondary | ICD-10-CM | POA: Diagnosis not present

## 2017-02-15 DIAGNOSIS — R0609 Other forms of dyspnea: Secondary | ICD-10-CM

## 2017-02-15 MED ORDER — LISINOPRIL 40 MG PO TABS: 40.0000 mg | ORAL_TABLET | Freq: Every day | ORAL | 3 refills | Status: DC

## 2017-02-15 MED ORDER — HYDROCHLOROTHIAZIDE 12.5 MG PO CAPS: 12.5000 mg | ORAL_CAPSULE | Freq: Every day | ORAL | 3 refills | Status: DC

## 2017-02-15 NOTE — Progress Notes (Signed)
Referring-Bruce Burchette MD Reason for referral-dyspnea  HPI: 57 year old male for evaluation of dyspnea at request of Carolann Littler MD. Nuclear study September 2015 showed ejection fraction 56%.  Diaphragmatic attenuation but no ischemia.  Patient recently ran out of his blood pressure medications and noticed increased dyspnea on exertion.  He was seen by his primary care physician and his blood pressure was markedly elevated.  His blood pressure medicines were resumed and he feels better.  He has mild dyspnea on exertion but no orthopnea, PND, pedal edema, chest pain or syncope.  Because of the above we were asked to evaluate.  Recent laboratories personally reviewed.  BNP January 23 164.  Hemoglobin normal.  Liver functions normal.  Sodium 142 with potassium 4.6.  BUN 14 creatinine 0.95.  Current Outpatient Medications  Medication Sig Dispense Refill  . amLODipine (NORVASC) 10 MG tablet Take 1 tablet (10 mg total) by mouth daily. 90 tablet 3  . atorvastatin (LIPITOR) 20 MG tablet Take 1 tablet (20 mg total) by mouth daily. 90 tablet 0  . cetirizine (ZYRTEC) 10 MG tablet Take 10 mg by mouth daily.    . fluticasone (FLONASE) 50 MCG/ACT nasal spray Place 2 sprays into both nostrils daily. 16 g 0  . lisinopril-hydrochlorothiazide (ZESTORETIC) 20-12.5 MG tablet Take 1 tablet by mouth daily. 90 tablet 3   No current facility-administered medications for this visit.     No Known Allergies   Past Medical History:  Diagnosis Date  . Hyperlipidemia   . Hypertension     Past Surgical History:  Procedure Laterality Date  . FOOT SURGERY     left foot    Social History   Socioeconomic History  . Marital status: Married    Spouse name: Not on file  . Number of children: Not on file  . Years of education: Not on file  . Highest education level: Not on file  Social Needs  . Financial resource strain: Not on file  . Food insecurity - worry: Not on file  . Food insecurity -  inability: Not on file  . Transportation needs - medical: Not on file  . Transportation needs - non-medical: Not on file  Occupational History  . Not on file  Tobacco Use  . Smoking status: Former Smoker    Packs/day: 1.00  . Smokeless tobacco: Never Used  Substance and Sexual Activity  . Alcohol use: Yes    Alcohol/week: 0.0 oz    Comment: 4-5 beers per night  . Drug use: No  . Sexual activity: Not on file  Other Topics Concern  . Not on file  Social History Narrative  . Not on file    Family History  Problem Relation Age of Onset  . Dementia Mother   . Hypertension Mother   . CAD Father     ROS: Mild knee arthralgias but no fevers or chills, productive cough, hemoptysis, dysphasia, odynophagia, melena, hematochezia, dysuria, hematuria, rash, seizure activity, orthopnea, PND, pedal edema, claudication. Remaining systems are negative.  Physical Exam:   Blood pressure (!) 146/100, pulse 88, height 5\' 9"  (1.753 m), weight 187 lb 3.2 oz (84.9 kg).  General:  Well developed/well nourished in NAD Skin warm/dry Patient not depressed No peripheral clubbing Back-normal HEENT-normal/normal eyelids Neck supple/normal carotid upstroke bilaterally; no bruits; no JVD; no thyromegaly chest - CTA/ normal expansion CV - RRR/normal S1 and S2; no murmurs, rubs or gallops;  PMI nondisplaced Abdomen -NT/ND, no HSM, no mass, + bowel sounds, no bruit  2+ femoral pulses, no bruits Ext-no edema, chords, 2+ DP Neuro-grossly nonfocal  ECG -normal sinus rhythm, right atrial enlargement, left ventricular hypertrophy with repolarization abnormality -personally reviewed  A/P  1 dyspnea-patient had mild dyspnea after he ran out of his blood pressure medications.  This has slowly improved.  This is likely secondary to uncontrolled hypertension.  I will arrange an echocardiogram to assess LV systolic function and left ventricular hypertrophy.  2 hypertension-blood pressure is elevated.  We will  increase his lisinopril to 40 mg daily and continue HCTZ and amlodipine.  Check potassium and renal function as well as TSH in 1 week.  Patient educated on low-sodium diet and other means to modify lifestyle for blood pressure control.  This includes no more than 2 alcoholic beverages daily.  We also discussed exercise.  3 hyperlipidemia-statin recently initiated.  Lipids and liver monitored by primary care.  Kirk Ruths, MD

## 2017-02-15 NOTE — Patient Instructions (Signed)
Medication Instructions:   STOP LISINOPRIL/HCTZ  START LISINOPRIL 40 MG ONCE DAILY  START HCTZ 12.5 MG ONCE DAILY  Labwork:  Your physician recommends that you return for lab work in: [place]  Testing/Procedures:  Your physician has requested that you have an echocardiogram. Echocardiography is a painless test that uses sound waves to create images of your heart. It provides your doctor with information about the size and shape of your heart and how well your heart's chambers and valves are working. This procedure takes approximately one hour. There are no restrictions for this procedure.    Follow-Up:  Your physician recommends that you schedule a follow-up appointment in: [place]   If you need a refill on your cardiac medications before your next appointment, please call your pharmacy.

## 2017-02-19 ENCOUNTER — Encounter: Payer: Self-pay | Admitting: *Deleted

## 2017-02-19 ENCOUNTER — Telehealth: Payer: Self-pay | Admitting: *Deleted

## 2017-02-19 ENCOUNTER — Other Ambulatory Visit (HOSPITAL_COMMUNITY): Payer: BLUE CROSS/BLUE SHIELD

## 2017-02-19 ENCOUNTER — Other Ambulatory Visit: Payer: Self-pay

## 2017-02-19 ENCOUNTER — Ambulatory Visit (HOSPITAL_COMMUNITY): Payer: BLUE CROSS/BLUE SHIELD | Attending: Cardiovascular Disease

## 2017-02-19 DIAGNOSIS — R0609 Other forms of dyspnea: Principal | ICD-10-CM

## 2017-02-19 DIAGNOSIS — I420 Dilated cardiomyopathy: Secondary | ICD-10-CM | POA: Diagnosis not present

## 2017-02-19 DIAGNOSIS — R0600 Dyspnea, unspecified: Secondary | ICD-10-CM

## 2017-02-19 DIAGNOSIS — I5030 Unspecified diastolic (congestive) heart failure: Secondary | ICD-10-CM | POA: Diagnosis not present

## 2017-02-19 NOTE — Telephone Encounter (Addendum)
-----   Message from Lelon Perla, MD sent at 02/19/2017  3:16 PM EST ----- lexiscan nuclear study to R/O ischemia as cause of mild LV dysfunction. [place] with pt, aware of results. lexiscan scheduled.

## 2017-02-23 ENCOUNTER — Ambulatory Visit (HOSPITAL_COMMUNITY)
Admission: RE | Admit: 2017-02-23 | Discharge: 2017-02-23 | Disposition: A | Discharge status: Home / Self Care | Payer: BLUE CROSS/BLUE SHIELD | Source: Ambulatory Visit | Attending: Internal Medicine | Admitting: Internal Medicine

## 2017-02-23 DIAGNOSIS — I10 Essential (primary) hypertension: Secondary | ICD-10-CM | POA: Diagnosis not present

## 2017-02-23 DIAGNOSIS — R0609 Other forms of dyspnea: Secondary | ICD-10-CM | POA: Diagnosis present

## 2017-02-23 DIAGNOSIS — Z87891 Personal history of nicotine dependence: Secondary | ICD-10-CM | POA: Insufficient documentation

## 2017-02-23 DIAGNOSIS — E782 Mixed hyperlipidemia: Secondary | ICD-10-CM | POA: Insufficient documentation

## 2017-02-23 DIAGNOSIS — R931 Abnormal findings on diagnostic imaging of heart and coronary circulation: Secondary | ICD-10-CM | POA: Insufficient documentation

## 2017-02-23 DIAGNOSIS — R0600 Dyspnea, unspecified: Secondary | ICD-10-CM

## 2017-02-23 LAB — MYOCARDIAL PERFUSION IMAGING
CHL CUP RESTING HR STRESS: 81 {beats}/min
LV dias vol: 151 mL (ref 62–150)
LV sys vol: 98 mL
Peak HR: 100 {beats}/min
SDS: 0
SRS: 8
SSS: 8
TID: 1.06

## 2017-02-23 LAB — BASIC METABOLIC PANEL
BUN/Creatinine Ratio: 13 (ref 9–20)
BUN: 12 mg/dL (ref 6–24)
CO2: 24 mmol/L (ref 20–29)
Calcium: 9.6 mg/dL (ref 8.7–10.2)
Chloride: 102 mmol/L (ref 96–106)
Creatinine, Ser: 0.93 mg/dL (ref 0.76–1.27)
GFR calc non Af Amer: 90 mL/min/{1.73_m2} (ref >59)
GFR, EST AFRICAN AMERICAN: 104 mL/min/{1.73_m2} (ref >59)
Glucose: 79 mg/dL (ref 65–99)
POTASSIUM: 4.9 mmol/L (ref 3.5–5.2)
SODIUM: 142 mmol/L (ref 134–144)

## 2017-02-23 LAB — TSH: TSH: 1.37 u[IU]/mL (ref 0.450–4.500)

## 2017-02-23 MED ORDER — TECHNETIUM TC 99M TETROFOSMIN IV KIT
30.9000 | PACK | Freq: Once | INTRAVENOUS | Status: AC | PRN
  Administered 2017-02-23: 30.9 via INTRAVENOUS
  Filled 2017-02-23: qty 31

## 2017-02-23 MED ORDER — TECHNETIUM TC 99M TETROFOSMIN IV KIT
10.2000 | PACK | Freq: Once | INTRAVENOUS | Status: AC | PRN
  Administered 2017-02-23: 10.2 via INTRAVENOUS
  Filled 2017-02-23: qty 11

## 2017-02-23 MED ORDER — REGADENOSON 0.4 MG/5ML IV SOLN
0.4000 mg | Freq: Once | INTRAVENOUS | Status: AC
  Administered 2017-02-23: 0.4 mg via INTRAVENOUS

## 2017-02-25 ENCOUNTER — Encounter: Payer: Self-pay | Admitting: *Deleted

## 2017-03-04 ENCOUNTER — Ambulatory Visit: Payer: BLUE CROSS/BLUE SHIELD | Admitting: Cardiology

## 2017-04-09 ENCOUNTER — Ambulatory Visit: Payer: BLUE CROSS/BLUE SHIELD | Admitting: Family Medicine

## 2017-04-14 ENCOUNTER — Ambulatory Visit: Payer: BLUE CROSS/BLUE SHIELD | Admitting: Family Medicine

## 2017-05-07 ENCOUNTER — Ambulatory Visit: Payer: BLUE CROSS/BLUE SHIELD | Admitting: Family Medicine

## 2017-05-21 ENCOUNTER — Ambulatory Visit: Payer: BLUE CROSS/BLUE SHIELD | Admitting: Family Medicine

## 2017-05-25 NOTE — Progress Notes (Deleted)
HPI: FU dyspnea.  Nuclear study September 2015 showed ejection fraction 56%.  No ischemia.  At last office visit patient had run out of blood pressure medications and was feeling more dyspneic.  They were resumed and he improved.  [place] 2/19 showed EF 35, diaphragmatic attenuation; no ischemia. Echo 2/19 showed EF 40-45, moderate diastolic dysfunction, mild LAE. Since last seen   Current Outpatient Medications  Medication Sig Dispense Refill  . amLODipine (NORVASC) 10 MG tablet Take 1 tablet (10 mg total) by mouth daily. 90 tablet 3  . atorvastatin (LIPITOR) 20 MG tablet Take 1 tablet (20 mg total) by mouth daily. 90 tablet 0  . cetirizine (ZYRTEC) 10 MG tablet Take 10 mg by mouth daily.    . fluticasone (FLONASE) 50 MCG/ACT nasal spray Place 2 sprays into both nostrils daily. 16 g 0  . hydrochlorothiazide (MICROZIDE) 12.5 MG capsule Take 1 capsule (12.5 mg total) by mouth daily. 90 capsule 3  . lisinopril (PRINIVIL,ZESTRIL) 40 MG tablet Take 1 tablet (40 mg total) by mouth daily. 90 tablet 3   No current facility-administered medications for this visit.      Past Medical History:  Diagnosis Date  . Hyperlipidemia   . Hypertension     Past Surgical History:  Procedure Laterality Date  . FOOT SURGERY     left foot    Social History   Socioeconomic History  . Marital status: Married    Spouse name: Not on file  . Number of children: Not on file  . Years of education: Not on file  . Highest education level: Not on file  Occupational History  . Not on file  Social Needs  . Financial resource strain: Not on file  . Food insecurity:    Worry: Not on file    Inability: Not on file  . Transportation needs:    Medical: Not on file    Non-medical: Not on file  Tobacco Use  . Smoking status: Former Smoker    Packs/day: 1.00  . Smokeless tobacco: Never Used  Substance and Sexual Activity  . Alcohol use: Yes    Alcohol/week: 0.0 oz    Comment: 4-5 beers per night  .  Drug use: No  . Sexual activity: Not on file  Lifestyle  . Physical activity:    Days per week: Not on file    Minutes per session: Not on file  . Stress: Not on file  Relationships  . Social connections:    Talks on phone: Not on file    Gets together: Not on file    Attends religious service: Not on file    Active member of club or organization: Not on file    Attends meetings of clubs or organizations: Not on file    Relationship status: Not on file  . Intimate partner violence:    Fear of current or ex partner: Not on file    Emotionally abused: Not on file    Physically abused: Not on file    Forced sexual activity: Not on file  Other Topics Concern  . Not on file  Social History Narrative  . Not on file    Family History  Problem Relation Age of Onset  . Dementia Mother   . Hypertension Mother   . CAD Father     ROS: no fevers or chills, productive cough, hemoptysis, dysphasia, odynophagia, melena, hematochezia, dysuria, hematuria, rash, seizure activity, orthopnea, PND, pedal edema, claudication. Remaining systems are  negative.  Physical Exam: Well-developed well-nourished in no acute distress.  Skin is warm and dry.  HEENT is normal.  Neck is supple.  Chest is clear to auscultation with normal expansion.  Cardiovascular exam is regular rate and rhythm.  Abdominal exam nontender or distended. No masses palpated. Extremities show no edema. neuro grossly intact  ECG- personally reviewed  A/P  1 dyspnea-symptoms have improved since resuming previous medications.  Echocardiogram shows mild to moderate LV dysfunction.  2 cardiomyopathy-  3 hypertension-blood pressure  4 hyperlipidemia-continue statin.  Kirk Ruths, MD

## 2017-06-04 ENCOUNTER — Ambulatory Visit: Payer: BLUE CROSS/BLUE SHIELD | Admitting: Cardiology

## 2017-06-04 DIAGNOSIS — R0989 Other specified symptoms and signs involving the circulatory and respiratory systems: Secondary | ICD-10-CM

## 2017-08-27 ENCOUNTER — Ambulatory Visit: Payer: BLUE CROSS/BLUE SHIELD | Admitting: Family Medicine

## 2017-08-27 DIAGNOSIS — Z0289 Encounter for other administrative examinations: Secondary | ICD-10-CM

## 2017-10-08 ENCOUNTER — Ambulatory Visit: Payer: BLUE CROSS/BLUE SHIELD | Admitting: Family Medicine

## 2017-10-13 ENCOUNTER — Ambulatory Visit: Payer: BLUE CROSS/BLUE SHIELD | Admitting: Family Medicine

## 2017-11-24 ENCOUNTER — Encounter: Payer: Self-pay | Admitting: Family Medicine

## 2017-11-24 ENCOUNTER — Ambulatory Visit: Payer: Self-pay

## 2017-11-24 ENCOUNTER — Ambulatory Visit: Payer: BLUE CROSS/BLUE SHIELD | Admitting: Family Medicine

## 2017-11-24 ENCOUNTER — Other Ambulatory Visit: Payer: Self-pay

## 2017-11-24 VITALS — BP 180/110 | HR 99 | Temp 98.2°F | Ht 69.0 in | Wt 187.7 lb

## 2017-11-24 DIAGNOSIS — B078 Other viral warts: Secondary | ICD-10-CM | POA: Diagnosis not present

## 2017-11-24 DIAGNOSIS — I10 Essential (primary) hypertension: Secondary | ICD-10-CM

## 2017-11-24 DIAGNOSIS — B079 Viral wart, unspecified: Secondary | ICD-10-CM

## 2017-11-24 NOTE — Patient Instructions (Signed)

## 2017-11-24 NOTE — Progress Notes (Signed)
  Subjective:     Patient ID: Benjamin Hines, male   DOB: 03/31/1960, 57 y.o.   MRN: [id number]  HPI Patient is seen with some skin lesions right side of face.  These first came about a month ago and seem to be spreading fairly rapidly.  These are nonpainful.  He has had difficulty shaving because of these.  He denies any prior history of warts or any, condylomatous lesions.  He has hypertension and is on 3 drug regimen with amlodipine 10 mg daily, lisinopril 40 mg daily, and HCTZ 12.5 mg daily.  He states he skipped his HCTZ this morning but otherwise has been compliant with medications.  He has gained some weight recently.  Poor compliance with diet.  Eating out frequently.  Frequent fast food.  Drinks about 2 beers per day (24 ounces).  Denies recent headache.  No chest pains.  No peripheral edema.  Past Medical History:  Diagnosis Date  . Hyperlipidemia   . Hypertension    Past Surgical History:  Procedure Laterality Date  . FOOT SURGERY     left foot    reports that he has quit smoking. He smoked 1.00 pack per day. He has never used smokeless tobacco. He reports that he drinks alcohol. He reports that he does not use drugs. family history includes CAD in his father; Dementia in his mother; Hypertension in his mother. No Known Allergies   Review of Systems  Constitutional: Negative for fatigue.  Eyes: Negative for visual disturbance.  Respiratory: Negative for cough, chest tightness and shortness of breath.   Cardiovascular: Negative for chest pain, palpitations and leg swelling.  Gastrointestinal: Negative for abdominal pain.  Skin: Positive for rash.  Neurological: Negative for dizziness, syncope, weakness, light-headedness and headaches.       Objective:   Physical Exam  Constitutional: He appears well-developed and well-nourished.  Neck: Neck supple. No thyromegaly present.  Cardiovascular: Normal rate and regular rhythm.  Pulmonary/Chest: Effort normal and breath  sounds normal.  Musculoskeletal: He exhibits no edema.  Skin:  Patient has fairly large cluster of typical fleshy verrucous type lesions involving left side of chin up near the midline. In total, he has around 12 different verrucous lesions       Assessment:     #1 cluster of verrucous lesions left chin region.  Question spread from shaving  #2 severe hypertension    Plan:     -We have strongly advocated additional medication for his hypertension but he is refusing at this time.  He states he would like to work on some weight loss first and reducing sodium and dietary change.  We explained he has severe hypertension and discussed both short and long-term risk of not treating more aggressively.  He does agree to come back in 2 weeks to reassess.  Would advise hydralazine or other additional medication at that point if not better to goal  -Discussed options for treating his verrucous lesions on the chin.  Avoid close shaving.  We discussed risk and benefits of liquid nitrogen therapy.  We discussed risk of pain, blistering, infection.  Patient consented.  12 lesions were treated without difficulty.  He will come back in 2 weeks to reassess  Benjamin Post MD [facility] at [facility]

## 2017-11-24 NOTE — Telephone Encounter (Signed)
Pt called with question about shaving after having lesions frozen from his face. Pt states he received no discharge instructions. Pt advised to avoid shaving the areas. A second call was placed to patient and instructions per office note read to patient again avoid close shaving.Pt verbalized understanding and will follow advice until follow up visit.  Reason for Disposition . General information question, no triage required and triager able to answer question  Answer Assessment - Initial Assessment Questions 1. REASON FOR CALL or QUESTION: "What is your reason for calling today?" or "How can I best help you?" or "What question do you have that I can help answer?"     Can I shave after having moles frozen from my face  Protocols used: INFORMATION ONLY CALL-A-AH

## 2017-12-07 ENCOUNTER — Ambulatory Visit: Payer: BLUE CROSS/BLUE SHIELD | Admitting: Family Medicine

## 2017-12-12 DIAGNOSIS — I5042 Chronic combined systolic (congestive) and diastolic (congestive) heart failure: Secondary | ICD-10-CM

## 2017-12-12 HISTORY — DX: Chronic combined systolic (congestive) and diastolic (congestive) heart failure: I50.42

## 2017-12-20 ENCOUNTER — Ambulatory Visit: Payer: BLUE CROSS/BLUE SHIELD | Admitting: Family Medicine

## 2017-12-27 ENCOUNTER — Inpatient Hospital Stay (HOSPITAL_COMMUNITY)
Admission: EM | Admit: 2017-12-27 | Discharge: 2017-12-31 | DRG: 305 | Disposition: A | Discharge status: Home / Self Care | Payer: BLUE CROSS/BLUE SHIELD | Attending: Internal Medicine | Admitting: Internal Medicine

## 2017-12-27 ENCOUNTER — Emergency Department (HOSPITAL_COMMUNITY): Payer: BLUE CROSS/BLUE SHIELD

## 2017-12-27 ENCOUNTER — Encounter (HOSPITAL_COMMUNITY): Payer: Self-pay | Admitting: Internal Medicine

## 2017-12-27 ENCOUNTER — Other Ambulatory Visit: Payer: Self-pay

## 2017-12-27 DIAGNOSIS — I10 Essential (primary) hypertension: Secondary | ICD-10-CM

## 2017-12-27 DIAGNOSIS — N179 Acute kidney failure, unspecified: Secondary | ICD-10-CM | POA: Diagnosis present

## 2017-12-27 DIAGNOSIS — E785 Hyperlipidemia, unspecified: Secondary | ICD-10-CM | POA: Diagnosis present

## 2017-12-27 DIAGNOSIS — I161 Hypertensive emergency: Principal | ICD-10-CM | POA: Diagnosis present

## 2017-12-27 DIAGNOSIS — Z9114 Patient's other noncompliance with medication regimen: Secondary | ICD-10-CM

## 2017-12-27 DIAGNOSIS — Z87891 Personal history of nicotine dependence: Secondary | ICD-10-CM

## 2017-12-27 DIAGNOSIS — R7982 Elevated C-reactive protein (CRP): Secondary | ICD-10-CM | POA: Diagnosis present

## 2017-12-27 DIAGNOSIS — F17200 Nicotine dependence, unspecified, uncomplicated: Secondary | ICD-10-CM | POA: Diagnosis present

## 2017-12-27 DIAGNOSIS — R109 Unspecified abdominal pain: Secondary | ICD-10-CM | POA: Diagnosis present

## 2017-12-27 DIAGNOSIS — I5042 Chronic combined systolic (congestive) and diastolic (congestive) heart failure: Secondary | ICD-10-CM | POA: Diagnosis present

## 2017-12-27 DIAGNOSIS — I110 Hypertensive heart disease with heart failure: Secondary | ICD-10-CM | POA: Diagnosis present

## 2017-12-27 DIAGNOSIS — R059 Cough, unspecified: Secondary | ICD-10-CM

## 2017-12-27 DIAGNOSIS — I714 Abdominal aortic aneurysm, without rupture, unspecified: Secondary | ICD-10-CM | POA: Diagnosis present

## 2017-12-27 DIAGNOSIS — K219 Gastro-esophageal reflux disease without esophagitis: Secondary | ICD-10-CM | POA: Diagnosis present

## 2017-12-27 DIAGNOSIS — K5900 Constipation, unspecified: Secondary | ICD-10-CM | POA: Diagnosis present

## 2017-12-27 DIAGNOSIS — I776 Arteritis, unspecified: Secondary | ICD-10-CM

## 2017-12-27 DIAGNOSIS — Z79899 Other long term (current) drug therapy: Secondary | ICD-10-CM

## 2017-12-27 DIAGNOSIS — R05 Cough: Secondary | ICD-10-CM

## 2017-12-27 DIAGNOSIS — F1010 Alcohol abuse, uncomplicated: Secondary | ICD-10-CM | POA: Diagnosis present

## 2017-12-27 HISTORY — DX: Abdominal aortic aneurysm, without rupture: I71.4

## 2017-12-27 HISTORY — DX: Abdominal aortic aneurysm, without rupture, unspecified: I71.40

## 2017-12-27 LAB — LIPASE, BLOOD: Lipase: 32 U/L (ref 11–51)

## 2017-12-27 LAB — CBC
HCT: 42.9 % (ref 39.0–52.0)
Hemoglobin: 13.9 g/dL (ref 13.0–17.0)
MCH: 29.4 pg (ref 26.0–34.0)
MCHC: 32.4 g/dL (ref 30.0–36.0)
MCV: 90.7 fL (ref 80.0–100.0)
Platelets: 221 10*3/uL (ref 150–400)
RBC: 4.73 MIL/uL (ref 4.22–5.81)
RDW: 12.4 % (ref 11.5–15.5)
WBC: 12.1 10*3/uL — ABNORMAL HIGH (ref 4.0–10.5)
nRBC: 0 % (ref 0.0–0.2)

## 2017-12-27 LAB — URINALYSIS, ROUTINE W REFLEX MICROSCOPIC
Bacteria, UA: NONE SEEN
Bilirubin Urine: NEGATIVE
Glucose, UA: NEGATIVE mg/dL
Hgb urine dipstick: NEGATIVE
Ketones, ur: NEGATIVE mg/dL
Leukocytes, UA: NEGATIVE
Nitrite: NEGATIVE
PH: 8 (ref 5.0–8.0)
Protein, ur: NEGATIVE mg/dL
SPECIFIC GRAVITY, URINE: 1.014 (ref 1.005–1.030)

## 2017-12-27 LAB — COMPREHENSIVE METABOLIC PANEL
ALK PHOS: 70 U/L (ref 38–126)
ALT: 16 U/L (ref 0–44)
AST: 23 U/L (ref 15–41)
Albumin: 3.3 g/dL — ABNORMAL LOW (ref 3.5–5.0)
Anion gap: 13 (ref 5–15)
BILIRUBIN TOTAL: 0.9 mg/dL (ref 0.3–1.2)
BUN: 9 mg/dL (ref 6–20)
CO2: 26 mmol/L (ref 22–32)
Calcium: 8.5 mg/dL — ABNORMAL LOW (ref 8.9–10.3)
Chloride: 100 mmol/L (ref 98–111)
Creatinine, Ser: 0.98 mg/dL (ref 0.61–1.24)
GFR calc Af Amer: >60 mL/min (ref >60)
GFR calc non Af Amer: >60 mL/min (ref >60)
Glucose, Bld: 108 mg/dL — ABNORMAL HIGH (ref 70–99)
POTASSIUM: 3.6 mmol/L (ref 3.5–5.1)
Sodium: 139 mmol/L (ref 135–145)
TOTAL PROTEIN: 6.8 g/dL (ref 6.5–8.1)

## 2017-12-27 LAB — SEDIMENTATION RATE: Sed Rate: 10 mm/hr (ref 0–16)

## 2017-12-27 LAB — MAGNESIUM: MAGNESIUM: 1.9 mg/dL (ref 1.7–2.4)

## 2017-12-27 LAB — C-REACTIVE PROTEIN: CRP: 11.8 mg/dL — ABNORMAL HIGH (ref <1.0)

## 2017-12-27 LAB — PHOSPHORUS: PHOSPHORUS: 2.6 mg/dL (ref 2.5–4.6)

## 2017-12-27 MED ORDER — LISINOPRIL 20 MG PO TABS
20.0000 mg | ORAL_TABLET | Freq: Every day | ORAL | Status: DC
  Administered 2017-12-27 – 2017-12-28 (×2): 20 mg via ORAL
  Filled 2017-12-27 (×2): qty 1

## 2017-12-27 MED ORDER — LABETALOL HCL 5 MG/ML IV SOLN
10.0000 mg | Freq: Once | INTRAVENOUS | Status: AC
  Administered 2017-12-27: 10 mg via INTRAVENOUS
  Filled 2017-12-27: qty 4

## 2017-12-27 MED ORDER — HYDROMORPHONE HCL 1 MG/ML IJ SOLN
0.5000 mg | Freq: Once | INTRAMUSCULAR | Status: AC
  Administered 2017-12-27: 0.5 mg via INTRAVENOUS
  Filled 2017-12-27: qty 1

## 2017-12-27 MED ORDER — THIAMINE HCL 100 MG/ML IJ SOLN
100.0000 mg | Freq: Once | INTRAMUSCULAR | Status: AC
  Administered 2017-12-27: 100 mg via INTRAVENOUS
  Filled 2017-12-27: qty 2

## 2017-12-27 MED ORDER — LABETALOL HCL 5 MG/ML IV SOLN
20.0000 mg | Freq: Once | INTRAVENOUS | Status: AC
  Administered 2017-12-27: 20 mg via INTRAVENOUS
  Filled 2017-12-27: qty 4

## 2017-12-27 MED ORDER — ONDANSETRON HCL 4 MG/2ML IJ SOLN
4.0000 mg | Freq: Once | INTRAMUSCULAR | Status: AC
  Administered 2017-12-27: 4 mg via INTRAVENOUS
  Filled 2017-12-27: qty 2

## 2017-12-27 MED ORDER — ACETAMINOPHEN 325 MG PO TABS: 650.0000 mg | ORAL_TABLET | Freq: Four times a day (QID) | ORAL | Status: DC | PRN

## 2017-12-27 MED ORDER — HYDROMORPHONE HCL 1 MG/ML IJ SOLN
1.0000 mg | INTRAMUSCULAR | Status: DC | PRN
  Administered 2017-12-27 – 2017-12-28 (×3): 1 mg via INTRAVENOUS
  Filled 2017-12-27 (×3): qty 1

## 2017-12-27 MED ORDER — LABETALOL HCL 5 MG/ML IV SOLN
20.0000 mg | INTRAVENOUS | Status: DC | PRN
  Administered 2017-12-27: 20 mg via INTRAVENOUS
  Filled 2017-12-27: qty 4

## 2017-12-27 MED ORDER — ONDANSETRON HCL 4 MG PO TABS: 4.0000 mg | ORAL_TABLET | Freq: Four times a day (QID) | ORAL | Status: DC | PRN

## 2017-12-27 MED ORDER — POTASSIUM CHLORIDE CRYS ER 20 MEQ PO TBCR
20.0000 meq | EXTENDED_RELEASE_TABLET | Freq: Once | ORAL | Status: AC
  Administered 2017-12-27: 20 meq via ORAL
  Filled 2017-12-27: qty 1

## 2017-12-27 MED ORDER — IOHEXOL 300 MG/ML  SOLN
100.0000 mL | Freq: Once | INTRAMUSCULAR | Status: AC | PRN
  Administered 2017-12-27: 100 mL via INTRAVENOUS

## 2017-12-27 MED ORDER — LORAZEPAM 2 MG/ML IJ SOLN: 2.0000 mg | INTRAMUSCULAR | Status: DC | PRN

## 2017-12-27 MED ORDER — ONDANSETRON HCL 4 MG/2ML IJ SOLN: 4.0000 mg | Freq: Four times a day (QID) | INTRAMUSCULAR | Status: DC | PRN

## 2017-12-27 MED ORDER — SENNOSIDES-DOCUSATE SODIUM 8.6-50 MG PO TABS
2.0000 | ORAL_TABLET | Freq: Once | ORAL | Status: AC
  Administered 2017-12-27: 2 via ORAL
  Filled 2017-12-27: qty 2

## 2017-12-27 MED ORDER — AMLODIPINE BESYLATE 5 MG PO TABS
5.0000 mg | ORAL_TABLET | Freq: Every day | ORAL | Status: DC
  Administered 2017-12-27 – 2017-12-28 (×2): 5 mg via ORAL
  Filled 2017-12-27 (×2): qty 1

## 2017-12-27 MED ORDER — VITAMIN B-1 100 MG PO TABS
100.0000 mg | ORAL_TABLET | Freq: Every day | ORAL | Status: DC
  Administered 2017-12-28 – 2017-12-31 (×4): 100 mg via ORAL
  Filled 2017-12-27 (×4): qty 1

## 2017-12-27 MED ORDER — MAGNESIUM SULFATE 2 GM/50ML IV SOLN
2.0000 g | Freq: Once | INTRAVENOUS | Status: AC
  Administered 2017-12-27: 2 g via INTRAVENOUS
  Filled 2017-12-27: qty 50

## 2017-12-27 MED ORDER — HYDROMORPHONE HCL 1 MG/ML IJ SOLN: 0.5000 mg | INTRAMUSCULAR | Status: DC | PRN

## 2017-12-27 NOTE — H&P (Signed)
History and Physical    Benjamin Hines TKW:[id number] DOB: [birth year] DOA: 12/27/2017  PCP: Eulas Post, MD   Patient coming from: Home.   I have personally briefly reviewed patient's old medical records in [place]  Chief Complaint: Abdominal pain.  HPI: Benjamin Hines is a 57 y.o. male with medical history significant of hyperlipidemia, hypertension, chronic combined systolic and diastolic heart failure, GERD, alcohol abuse, tobacco abuse who is coming to the emergency department with complaints of progressively worse abdominal pain since about a week.  He mentions lifting heavy about 2 weeks ago, 1 week prior to him developing pain.  However, the note from triage nursing says that he was lifting heavy about 3 days ago.  He denies fever, chills, nausea, emesis, diarrhea, melena or hematochezia.  He has been constipated for the past 4 days.  He denies dysuria, frequency or hematuria.  He denies sore throat, wheezing, hemoptysis, chest pain, palpitations, dizziness, diaphoresis, PND, orthopnea or pitting edema of the lower extremities.  He denies polyuria, polydipsia, polyphagia or blurry vision.  No heat or cold intolerance.  Denies skin rashes or pruritus.  He is states that he has not been taking his blood pressure medication since last week.  ED Course: He is urinalysis shows a cloudy appearance, but is otherwise negative.  White count was 12.1, hemoglobin 13.9 g/dL and platelets 221.  CMP shows a glucose of 108 and calcium of 8.5 mg/dL.  Calcium is normal when corrected to an albumin of 3.3 g/dL.  All other CMP values are normal.  Lipase is normal at 32 units/L.  Imaging: CT abdomen/pelvis with contrast did not show any acute findings.  However there is aneurysmal dilatation of the aorta 4.0 x 4.2 cm most severe at the level of the orthopedic eye-itis and immediately below where there is also extensive mural thickening and some subtle surrounding inflammatory changes.   Findings suggesting a large vessel vasculitis.  There is trace left vascular effusion.  There is severe elevation of the left hemidiaphragm.  Hepatic asteatosis.  Please see images for full radiology report for further detail  Review of Systems: As per HPI otherwise 10 point review of systems negative.   Past Medical History:  Diagnosis taking Date  . Hyperlipidemia   . Hypertension Chronic combined systolic and diastolic congestive heart failure (HCC) GERD Tobacco use     Past Surgical History:  Procedure Laterality Date  . FOOT SURGERY     left foot     reports that he has quit smoking. He smoked 1.00 pack per day. He has never used smokeless tobacco. He reports current alcohol use. He reports that he does not use drugs.  No Known Allergies  Family History  Problem Relation Age of Onset  . Dementia Mother   . Hypertension Mother   . CAD Father    Prior to Admission medications   Medication Sig Start Date End Date Taking? Authorizing Provider  amLODipine (NORVASC) 10 MG tablet Take 1 tablet (10 mg total) by mouth daily. 02/03/17  Yes Burchette, Alinda Sierras, MD  atorvastatin (LIPITOR) 20 MG tablet Take 1 tablet (20 mg total) by mouth daily. 02/05/17  Yes Burchette, Alinda Sierras, MD  hydrochlorothiazide (MICROZIDE) 12.5 MG capsule Take 1 capsule (12.5 mg total) by mouth daily. 02/15/17 12/27/25 Yes Lelon Perla, MD  lisinopril (PRINIVIL,ZESTRIL) 40 MG tablet Take 1 tablet (40 mg total) by mouth daily. 02/15/17 12/27/25 Yes Lelon Perla, MD  fluticasone (FLONASE) 50 MCG/ACT  nasal spray Place 2 sprays into both nostrils daily. Patient not taking: Reported on 12/27/2017 01/23/14   Veatrice Kells, MD    Physical Exam: Vitals:   12/27/17 0645 12/27/17 0759 12/27/17 1000 12/27/17 1015  BP: (!) 173/129 (!) 216/142 (!) 179/138 (!) 188/150  Pulse: 91 90 93 94  Resp:  16 (!) 22 (!) 24  Temp:      TempSrc:      SpO2: 97% 97% 97% 98%  Weight:      Height:        Constitutional: NAD,  calm, comfortable Eyes: PERRL, lids and conjunctivae normal ENMT: Mucous membranes are moist. Posterior pharynx clear of any exudate or lesions. Neck: Normal, supple, no masses, no thyromegaly Respiratory: Clear to auscultation bilaterally, no wheezing, no crackles. Normal respiratory effort. No accessory muscle use.  Cardiovascular: Regular rate and rhythm, no murmurs / rubs / gallops. No extremity edema. 2+ pedal pulses. No carotid bruits.  Abdomen: Mildly distended.  Soft, mild epigastric and left lower quadrant tenderness, no guarding or rebound, no pulsating masses palpated. No hepatosplenomegaly. Bowel sounds positive.  Musculoskeletal: no clubbing / cyanosis.  Good ROM, no contractures. Normal muscle tone.  Skin: no rashes, lesions, ulcers on limited dermatological examination. Neurologic: CN 2-12 grossly intact. Sensation intact, DTR normal. Strength 5/5 in all 4.  Psychiatric: Normal judgment and insight. Alert and oriented x 3. Normal mood.   Labs on Admission: I have personally reviewed following labs and imaging studies  CBC: Recent Labs  Lab 12/27/17 0505  WBC 12.1*  HGB 13.9  HCT 42.9  MCV 90.7  PLT 193   Basic Metabolic Panel: Recent Labs  Lab 12/27/17 0505  NA 139  K 3.6  CL 100  CO2 26  GLUCOSE 108*  BUN 9  CREATININE 0.98  CALCIUM 8.5*   GFR: Estimated Creatinine Clearance: 81.2 mL/min (by C-G formula based on SCr of 0.98 mg/dL). Liver Function Tests: Recent Labs  Lab 12/27/17 0505  AST 23  ALT 16  ALKPHOS 70  BILITOT 0.9  PROT 6.8  ALBUMIN 3.3*   Recent Labs  Lab 12/27/17 0505  LIPASE 32   No results for input(s): AMMONIA in the last 168 hours. Coagulation Profile: No results for input(s): INR, PROTIME in the last 168 hours. Cardiac Enzymes: No results for input(s): CKTOTAL, CKMB, CKMBINDEX, TROPONINI in the last 168 hours. BNP (last 3 results) Recent Labs    02/03/17 0747  PROBNP 164.0*   HbA1C: No results for input(s): HGBA1C in  the last 72 hours. CBG: No results for input(s): GLUCAP in the last 168 hours. Lipid Profile: No results for input(s): CHOL, HDL, LDLCALC, TRIG, CHOLHDL, LDLDIRECT in the last 72 hours. Thyroid Function Tests: No results for input(s): TSH, T4TOTAL, FREET4, T3FREE, THYROIDAB in the last 72 hours. Anemia Panel: No results for input(s): VITAMINB12, FOLATE, FERRITIN, TIBC, IRON, RETICCTPCT in the last 72 hours. Urine analysis:    Component Value Date/Time   COLORURINE YELLOW 12/27/2017 0500   APPEARANCEUR CLOUDY (A) 12/27/2017 0500   LABSPEC 1.014 12/27/2017 0500   PHURINE 8.0 12/27/2017 0500   GLUCOSEU NEGATIVE 12/27/2017 0500   HGBUR NEGATIVE 12/27/2017 0500   BILIRUBINUR NEGATIVE 12/27/2017 0500   BILIRUBINUR n 04/26/2013 1230   KETONESUR NEGATIVE 12/27/2017 0500   PROTEINUR NEGATIVE 12/27/2017 0500   UROBILINOGEN 0.2 04/26/2013 1230   NITRITE NEGATIVE 12/27/2017 0500   LEUKOCYTESUR NEGATIVE 12/27/2017 0500    Radiological Exams on Admission: Ct Abdomen Pelvis W Contrast  Result Date: 12/27/2017 CLINICAL  DATA:  57 year old male with history of pain extending from the epigastric region to the groin worsening over the past 1 and half weeks. Last bowel movement 3 days ago. EXAM: CT ABDOMEN AND PELVIS WITH CONTRAST TECHNIQUE: Multidetector CT imaging of the abdomen and pelvis was performed using the standard protocol following bolus administration of intravenous contrast. CONTRAST:  143mL OMNIPAQUE IOHEXOL 300 MG/ML  SOLN COMPARISON:  CT the abdomen and pelvis 03/09/2008. FINDINGS: Lower chest: Elevation of the left hemidiaphragm. Trace left pleural effusion. Mild cardiomegaly. Hepatobiliary: Mild diffuse low attenuation throughout the hepatic parenchyma, suggestive of hepatic steatosis. No suspicious cystic or solid hepatic lesions. No intra or extrahepatic biliary ductal dilatation. Gallbladder is normal in appearance. Pancreas: No pancreatic mass. No pancreatic ductal dilatation. No  pancreatic or peripancreatic fluid or inflammatory changes. Spleen: Unremarkable. Adrenals/Urinary Tract: Multiple low-attenuation lesions are noted in both kidneys, largest of which are compatible with simple cysts, measuring up to 1.6 cm in the upper pole of the left kidney. Other subcentimeter low-attenuation lesions are noted in both kidneys, too small to characterize, but statistically likely to represent tiny cysts. No hydroureteronephrosis. Urinary bladder is normal in appearance. Bilateral adrenal glands are normal in appearance. Stomach/Bowel: Normal appearance of the stomach. No pathologic dilatation of small bowel or colon. Normal appendix. Vascular/Lymphatic: Aortic atherosclerosis with fusiform aneurysmal dilatation of the abdominal aorta, most dilated in the suprarenal abdominal aorta at and immediately below the level of the aortic hiatus measuring up to 4.0 x 4.2 cm. There is extensive mural thickening throughout the upper abdominal aorta, as well as a small burden of atheromatous plaque. Small amount of soft tissue stranding in the para-aortic fat suggesting inflammation. Celiac axis, superior mesenteric artery and inferior mesenteric artery are all widely patent at this time. No lymphadenopathy noted in the abdomen or pelvis. Reproductive: Prostate gland and seminal vesicles are unremarkable in appearance. Other: No significant volume of ascites.  No pneumoperitoneum. Musculoskeletal: There are no aggressive appearing lytic or blastic lesions noted in the visualized portions of the skeleton. IMPRESSION: 1. No acute findings are noted in the abdomen or pelvis. 2. However, there is aneurysmal dilatation of the abdominal aorta (4.0 x 4.2 cm), most severe at the level of the aortic hiatus an immediately below where there is also extensive mural thickening and some subtle surrounding inflammatory changes. Findings are strongly suggestive of a large vessel vasculitis. Further clinical evaluation is  recommended. 3. Trace left pleural effusion. 4. Severe elevation of the left hemidiaphragm. 5. Hepatic steatosis. Electronically Signed   By: Vinnie Langton M.D.   On: 12/27/2017 11:34   02/19/2017 echocardiogram  ------------------------------------------------------------------- LV EF: 40% -   45%  ------------------------------------------------------------------- Indications:      (R06.09). GLS. 16.3  ------------------------------------------------------------------- History:   PMH:  Acquired from the patient and from the patient&'s chart.  Exertional dyspnea. No prior cardiac history.  Risk factors:  Former tobacco use. Hypertension. Dyslipidemia.  ------------------------------------------------------------------- Study Conclusions  - Left ventricle: The cavity size was normal. Wall thickness was   normal. Systolic function was mildly to moderately reduced. The   estimated ejection fraction was in the range of 40% to 45%.   Although no diagnostic regional wall motion abnormality was   identified, this possibility cannot be completely excluded on the   basis of this study. Features are consistent with a pseudonormal   left ventricular filling pattern, with concomitant abnormal   relaxation and increased filling pressure (grade 2 diastolic   dysfunction). - Left atrium: The atrium  was mildly dilated.   EKG: Independently reviewed.  Tracing not available  Assessment/Plan Principal Problem:   Hypertensive emergency Admit to the progressive unit/observation. Continue labetalol as needed. Continue analgesics as needed. Resume amlodipine 5 mg p.o. daily. Resume lisinopril 40 mg p.o. daily. Monitor blood pressure closely. Consider starting Cardene drip if blood pressure remains elevated.  Active Problems:   AAA (abdominal aortic aneurysm) without rupture (HCC) Continue blood pressure and pain control. Advised the patient to cease alcohol and tobacco  consumption. Vascular surgery will evaluate.    Abdominal pain Analgesics as needed.    Alcohol abuse CIWA protocol started. Thiamine and MVI supplementation.    Chronic combined systolic and diastolic congestive heart failure (HCC) No signs of decompensation at this time. Resume ACE inhibitor and diuretic. Prior to discharge will be starting long-acting oral beta-blocker.    GERD (gastroesophageal reflux disease) Protonix 40 mg p.o. daily.    Tobacco use disorder Smoking cessation advised. Nicotine replacement therapy ordered as needed.    Hyperlipidemia Not on atorvastatin  Statin therapy may be an issue if the patient continues to alcohol consumption.       DVT prophylaxis: SCDs. Code Status: Full code. Family Communication:  Disposition Plan: Observation for BP control and  Consults called: Vascular surgery will evaluate the patient. (Dr. Carlis Abbott spoke to [place]) Admission status: Observation/Progressive.   Reubin Milan MD [facility] Pager [phone].  If 7PM-7AM, please contact night-coverage [email or web address] Password [facility]  12/27/2017, 12:46 PM

## 2017-12-27 NOTE — ED Provider Notes (Signed)
[facility] EMERGENCY DEPARTMENT Provider Note   CSN: [id number] Arrival date & time: 12/27/17  8756     History   Chief Complaint Chief Complaint  Patient presents with  . Abdominal Pain    HPI Benjamin Hines is a 57 y.o. male.  Patient with history of hypertension, GERD, no previous surgeries on the abdomen --presents the emergency department today with complaint of abdominal pain.  Patient states that he first had pain in his left groin area when he lifted something about a week ago.  For approximately the last 4 days he has had a generalized abdominal pain but the pain continues to be worse in the left groin and testicle area.  No obvious areas of swelling.  No vomiting.  No documented fevers.  Patient has had ongoing "cold symptoms" which consist of nasal congestion and cough productive of green mucus.  This started a week ago as well.  No difficulty with urination.  He has had some constipation but is able to pass gas.  No treatments prior to arrival other than Alka-Seltzer which he states helps him pass gas.     Past Medical History:  Diagnosis Date  . Hyperlipidemia   . Hypertension     Patient Active Problem List   Diagnosis Date Noted  . Hyperlipidemia 02/09/2017  . Erectile dysfunction 10/30/2013  . Physical exam, annual 04/06/2010  . Tobacco use disorder 04/06/2010  . HTN (hypertension) 02/25/2010  . GERD (gastroesophageal reflux disease) 02/25/2010  . HEMATOCHEZIA 03/13/2008    Past Surgical History:  Procedure Laterality Date  . FOOT SURGERY     left foot        Home Medications    Prior to Admission medications   Medication Sig Start Date End Date Taking? Authorizing Provider  amLODipine (NORVASC) 10 MG tablet Take 1 tablet (10 mg total) by mouth daily. 02/03/17   Burchette, Alinda Sierras, MD  atorvastatin (LIPITOR) 20 MG tablet Take 1 tablet (20 mg total) by mouth daily. 02/05/17   Burchette, Alinda Sierras, MD  cetirizine (ZYRTEC) 10 MG  tablet Take 10 mg by mouth daily.    [provider]  fluticasone (FLONASE) 50 MCG/ACT nasal spray Place 2 sprays into both nostrils daily. 01/23/14   Palumbo, April, MD  hydrochlorothiazide (MICROZIDE) 12.5 MG capsule Take 1 capsule (12.5 mg total) by mouth daily. 02/15/17 05/16/17  Lelon Perla, MD  lisinopril (PRINIVIL,ZESTRIL) 40 MG tablet Take 1 tablet (40 mg total) by mouth daily. 02/15/17 05/16/17  Lelon Perla, MD    Family History Family History  Problem Relation Age of Onset  . Dementia Mother   . Hypertension Mother   . CAD Father     Social History Social History   Tobacco Use  . Smoking status: Former Smoker    Packs/day: 1.00  . Smokeless tobacco: Never Used  Substance Use Topics  . Alcohol use: Yes    Alcohol/week: 0.0 standard drinks    Comment: 4-5 beers per night  . Drug use: No     Allergies   Patient has no known allergies.   Review of Systems Review of Systems  Constitutional: Negative for fever.  HENT: Positive for congestion. Negative for rhinorrhea and sore throat.   Eyes: Negative for redness.  Respiratory: Positive for cough.   Cardiovascular: Negative for chest pain.  Gastrointestinal: Positive for abdominal pain and constipation. Negative for diarrhea, nausea and vomiting.  Genitourinary: Positive for testicular pain. Negative for dysuria.  Musculoskeletal: Negative  for myalgias.  Skin: Negative for rash.  Neurological: Negative for headaches.     Physical Exam Updated Vital Signs BP (!) 207/133 (BP Location: Left Arm)   Pulse (!) 101   Temp 98.7 F (37.1 C) (Oral)   Resp 16   Ht 5\' 9"  (1.753 m)   Wt 84.8 kg   SpO2 99%   BMI 27.62 kg/m   Physical Exam Vitals signs and nursing note reviewed.  Constitutional:      Appearance: He is well-developed.  HENT:     Head: Normocephalic and atraumatic.  Eyes:     General:        Right eye: No discharge.        Left eye: No discharge.     Conjunctiva/sclera: Conjunctivae  normal.  Neck:     Musculoskeletal: Normal range of motion and neck supple.  Cardiovascular:     Rate and Rhythm: Normal rate and regular rhythm.     Heart sounds: Normal heart sounds.  Pulmonary:     Effort: Pulmonary effort is normal.     Breath sounds: Normal breath sounds. No wheezing, rhonchi or rales.  Abdominal:     General: There is distension (mild).     Palpations: Abdomen is soft.     Tenderness: There is generalized abdominal tenderness and tenderness in the left lower quadrant. There is no guarding or rebound.     Hernia: There is no hernia in the right inguinal area or left inguinal area.  Skin:    General: Skin is warm and dry.  Neurological:     Mental Status: He is alert.      ED Treatments / Results  Labs (all labs ordered are listed, but only abnormal results are displayed) Labs Reviewed  COMPREHENSIVE METABOLIC PANEL - Abnormal; Notable for the following components:      Result Value   Glucose, Bld 108 (*)    Calcium 8.5 (*)    Albumin 3.3 (*)    All other components within normal limits  CBC - Abnormal; Notable for the following components:   WBC 12.1 (*)    All other components within normal limits  URINALYSIS, ROUTINE W REFLEX MICROSCOPIC - Abnormal; Notable for the following components:   APPearance CLOUDY (*)    All other components within normal limits  LIPASE, BLOOD    EKG None  Radiology Ct Abdomen Pelvis W Contrast  Result Date: 12/27/2017 CLINICAL DATA:  57 year old male with history of pain extending from the epigastric region to the groin worsening over the past 1 and half weeks. Last bowel movement 3 days ago. EXAM: CT ABDOMEN AND PELVIS WITH CONTRAST TECHNIQUE: Multidetector CT imaging of the abdomen and pelvis was performed using the standard protocol following bolus administration of intravenous contrast. CONTRAST:  162mL OMNIPAQUE IOHEXOL 300 MG/ML  SOLN COMPARISON:  CT the abdomen and pelvis 03/09/2008. FINDINGS: Lower chest:  Elevation of the left hemidiaphragm. Trace left pleural effusion. Mild cardiomegaly. Hepatobiliary: Mild diffuse low attenuation throughout the hepatic parenchyma, suggestive of hepatic steatosis. No suspicious cystic or solid hepatic lesions. No intra or extrahepatic biliary ductal dilatation. Gallbladder is normal in appearance. Pancreas: No pancreatic mass. No pancreatic ductal dilatation. No pancreatic or peripancreatic fluid or inflammatory changes. Spleen: Unremarkable. Adrenals/Urinary Tract: Multiple low-attenuation lesions are noted in both kidneys, largest of which are compatible with simple cysts, measuring up to 1.6 cm in the upper pole of the left kidney. Other subcentimeter low-attenuation lesions are noted in both kidneys, too small to  characterize, but statistically likely to represent tiny cysts. No hydroureteronephrosis. Urinary bladder is normal in appearance. Bilateral adrenal glands are normal in appearance. Stomach/Bowel: Normal appearance of the stomach. No pathologic dilatation of small bowel or colon. Normal appendix. Vascular/Lymphatic: Aortic atherosclerosis with fusiform aneurysmal dilatation of the abdominal aorta, most dilated in the suprarenal abdominal aorta at and immediately below the level of the aortic hiatus measuring up to 4.0 x 4.2 cm. There is extensive mural thickening throughout the upper abdominal aorta, as well as a small burden of atheromatous plaque. Small amount of soft tissue stranding in the para-aortic fat suggesting inflammation. Celiac axis, superior mesenteric artery and inferior mesenteric artery are all widely patent at this time. No lymphadenopathy noted in the abdomen or pelvis. Reproductive: Prostate gland and seminal vesicles are unremarkable in appearance. Other: No significant volume of ascites.  No pneumoperitoneum. Musculoskeletal: There are no aggressive appearing lytic or blastic lesions noted in the visualized portions of the skeleton. IMPRESSION: 1.  No acute findings are noted in the abdomen or pelvis. 2. However, there is aneurysmal dilatation of the abdominal aorta (4.0 x 4.2 cm), most severe at the level of the aortic hiatus an immediately below where there is also extensive mural thickening and some subtle surrounding inflammatory changes. Findings are strongly suggestive of a large vessel vasculitis. Further clinical evaluation is recommended. 3. Trace left pleural effusion. 4. Severe elevation of the left hemidiaphragm. 5. Hepatic steatosis. Electronically Signed   By: Vinnie Langton M.D.   On: 12/27/2017 11:34    Procedures Procedures (including critical care time)  Medications Ordered in ED Medications  HYDROmorphone (DILAUDID) injection 0.5 mg (has no administration in time range)  ondansetron (ZOFRAN) injection 4 mg (has no administration in time range)  labetalol (NORMODYNE,TRANDATE) injection 20 mg (has no administration in time range)  labetalol (NORMODYNE,TRANDATE) injection 10 mg (10 mg Intravenous Given 12/27/17 1003)  iohexol (OMNIPAQUE) 300 MG/ML solution 100 mL (100 mLs Intravenous Contrast Given 12/27/17 1116)     Initial Impression / Assessment and Plan / ED Course  I have reviewed the triage vital signs and the nursing notes.  Pertinent labs & imaging results that were available during my care of the patient were reviewed by me and considered in my medical decision making (see chart for details).     Patient seen and examined. Work-up initiated. Declines pain medication at this time.   Vital signs reviewed and are as follows: BP (!) 207/133 (BP Location: Left Arm)   Pulse (!) 101   Temp 98.7 F (37.1 C) (Oral)   Resp 16   Ht 5\' 9"  (1.753 m)   Wt 84.8 kg   SpO2 99%   BMI 27.62 kg/m   8:08 AM Rounded on patient. Awaiting CT imaging. Remains comfortable. Pt is quite hypertensive, although asymptomatic. Will give a dose of IV labetalol 10mg . If CT ok, can have home meds, but NPO for now.   11:51 AM CT  resulted.  Patient with 4 cm x 4 cm suprarenal aortic aneurysm with changes concerning for vasculitis.  Discussed with Dr. Tamera Punt.  Will discuss with vascular surgery.  Patient updated.  He states that he is ready to go home.  Requesting something to drink.  I have given him some ginger ale.  Pending reccs.  BP still elevated.   BP (!) 188/150   Pulse 94   Temp 98.7 F (37.1 C) (Oral)   Resp (!) 24   Ht 5\' 9"  (1.753 m)   Wt  84.8 kg   SpO2 98%   BMI 27.62 kg/m   12:23 PM spoke with Dr. Carlis Abbott of vascular surgery. Findings may be related to high BP. Recommends admission for control blood pressure.  They will consult.  No emergent surgical needs.  Additional IV labetalol ordered for persistently elevated blood pressure.   Will request hospitalist admission.   12:36 PM Dr. Olevia Bowens to see.   CRITICAL CARE Performed by: Carlisle Cater PA-C Total critical care time: 40 minutes Critical care time was exclusive of separately billable procedures and treating other patients. Critical care was necessary to treat or prevent imminent or life-threatening deterioration. Critical care was time spent personally by me on the following activities: development of treatment plan with patient and/or surrogate as well as nursing, discussions with consultants, evaluation of patient's response to treatment, examination of patient, obtaining history from patient or surrogate, ordering and performing treatments and interventions, ordering and review of laboratory studies, ordering and review of radiographic studies, pulse oximetry and re-evaluation of patient's condition.   Final Clinical Impressions(s) / ED Diagnoses   Final diagnoses:  Hypertensive emergency  Aortitis ([place])   Admit.   ED Discharge Orders    None       Carlisle Cater, Hershal Coria 12/27/17 [place], [place], MD 12/31/17 [phone]

## 2017-12-27 NOTE — ED Notes (Signed)
Admit Doctor at bedside.  

## 2017-12-27 NOTE — Plan of Care (Signed)
  Problem: Education: Goal: Knowledge of General Education information will improve Description Including pain rating scale, medication(s)/side effects and non-pharmacologic comfort measures Outcome: Progressing   Problem: Clinical Measurements: Goal: Ability to maintain clinical measurements within normal limits will improve Outcome: Progressing   Problem: Clinical Measurements: Goal: Respiratory complications will improve Outcome: Completed/Met   Problem: Activity: Goal: Risk for activity intolerance will decrease Outcome: Completed/Met

## 2017-12-27 NOTE — ED Triage Notes (Signed)
Pt also states he has not had a BM since last week.

## 2017-12-27 NOTE — Consult Note (Addendum)
Hospital Consult    Reason for Consult:  Suprarenal aortic aneurysm with concern for vasculitis Requesting Physician:  Dr. Rondel Oh MRN #:  [id number]  History of Present Illness: This is a 57 y.o. male with PMH significant for uncontrolled HTN presents to ED this morning due to ongoing abdominal pain starting about 1 week ago.  He characterizes the pain as persistent with radiation to L groin.  He denies any subjective fever or chills however he has had some congestion and cough.  He denies any recent infections.  He denies any abd surgeries in the past.  Also denies IV drug use.  He has no family history of vasculitis.  He is a former smoker.  Past Medical History:  Diagnosis Date  . Hyperlipidemia   . Hypertension     Past Surgical History:  Procedure Laterality Date  . FOOT SURGERY     left foot    No Known Allergies  Prior to Admission medications   Medication Sig Start Date End Date Taking? Authorizing Provider  amLODipine (NORVASC) 10 MG tablet Take 1 tablet (10 mg total) by mouth daily. 02/03/17  Yes Burchette, Alinda Sierras, MD  atorvastatin (LIPITOR) 20 MG tablet Take 1 tablet (20 mg total) by mouth daily. 02/05/17  Yes Burchette, Alinda Sierras, MD  hydrochlorothiazide (MICROZIDE) 12.5 MG capsule Take 1 capsule (12.5 mg total) by mouth daily. 02/15/17 12/27/25 Yes Lelon Perla, MD  lisinopril (PRINIVIL,ZESTRIL) 40 MG tablet Take 1 tablet (40 mg total) by mouth daily. 02/15/17 12/27/25 Yes Lelon Perla, MD  fluticasone (FLONASE) 50 MCG/ACT nasal spray Place 2 sprays into both nostrils daily. Patient not taking: Reported on 12/27/2017 01/23/14   Randal Buba, April, MD    Social History   Socioeconomic History  . Marital status: Married    Spouse name: Not on file  . Number of children: Not on file  . Years of education: Not on file  . Highest education level: Not on file  Occupational History  . Not on file  Social Needs  . Financial resource strain: Not on file  . Food  insecurity:    Worry: Not on file    Inability: Not on file  . Transportation needs:    Medical: Not on file    Non-medical: Not on file  Tobacco Use  . Smoking status: Former Smoker    Packs/day: 1.00  . Smokeless tobacco: Never Used  Substance and Sexual Activity  . Alcohol use: Yes    Alcohol/week: 0.0 standard drinks    Comment: 4-5 beers per night  . Drug use: No  . Sexual activity: Not on file  Lifestyle  . Physical activity:    Days per week: Not on file    Minutes per session: Not on file  . Stress: Not on file  Relationships  . Social connections:    Talks on phone: Not on file    Gets together: Not on file    Attends religious service: Not on file    Active member of club or organization: Not on file    Attends meetings of clubs or organizations: Not on file    Relationship status: Not on file  . Intimate partner violence:    Fear of current or ex partner: Not on file    Emotionally abused: Not on file    Physically abused: Not on file    Forced sexual activity: Not on file  Other Topics Concern  . Not on file  Social History Narrative  .  Not on file     Family History  Problem Relation Age of Onset  . Dementia Mother   . Hypertension Mother   . CAD Father     ROS: Otherwise negative unless mentioned in HPI  Physical Examination  Vitals:   12/27/17 1000 12/27/17 1015  BP: (!) 179/138 (!) 188/150  Pulse: 93 94  Resp: (!) 22 (!) 24  Temp:    SpO2: 97% 98%   Body mass index is 27.62 kg/m.  General:  WDWN in NAD Gait: Not observed HENT: WNL, normocephalic Pulmonary: normal non-labored breathing Cardiac: tachycardic Abdomen: some tenderness to deep palpation, tenderness generalized, no referred pain, no signs of peritonitis Skin: without rashes Vascular Exam/Pulses: symmetrical DP and radial pulses Extremities: without ischemic changes, without Gangrene , without cellulitis; without open wounds;  Musculoskeletal: no muscle wasting or  atrophy  Neurologic: A&O X 3;  No focal weakness or paresthesias are detected; speech is fluent/normal Psychiatric:  The pt has Normal affect. Lymph:  Unremarkable  CBC    Component Value Date/Time   WBC 12.1 (H) 12/27/2017 0505   RBC 4.73 12/27/2017 0505   HGB 13.9 12/27/2017 0505   HCT 42.9 12/27/2017 0505   PLT 221 12/27/2017 0505   MCV 90.7 12/27/2017 0505   MCH 29.4 12/27/2017 0505   MCHC 32.4 12/27/2017 0505   RDW 12.4 12/27/2017 0505   LYMPHSABS 2.6 02/03/2017 0747   MONOABS 0.7 02/03/2017 0747   EOSABS 0.1 02/03/2017 0747   BASOSABS 0.0 02/03/2017 0747    BMET    Component Value Date/Time   NA 139 12/27/2017 0505   NA 142 02/23/2017 1130   K 3.6 12/27/2017 0505   CL 100 12/27/2017 0505   CO2 26 12/27/2017 0505   GLUCOSE 108 (H) 12/27/2017 0505   BUN 9 12/27/2017 0505   BUN 12 02/23/2017 1130   CREATININE 0.98 12/27/2017 0505   CALCIUM 8.5 (L) 12/27/2017 0505   GFRNONAA >60 12/27/2017 0505   GFRAA >60 12/27/2017 0505    COAGS: No results found for: INR, PROTIME   Non-Invasive Vascular Imaging:   IMPRESSION: 1. No acute findings are noted in the abdomen or pelvis. 2. However, there is aneurysmal dilatation of the abdominal aorta (4.0 x 4.2 cm), most severe at the level of the aortic hiatus an immediately below where there is also extensive mural thickening and some subtle surrounding inflammatory changes. Findings are strongly suggestive of a large vessel vasculitis. Further clinical evaluation is recommended. 3. Trace left pleural effusion. 4. Severe elevation of the left hemidiaphragm. 5. Hepatic steatosis.    ASSESSMENT/PLAN: This is a 57 y.o. male with 1 week long abd pain  CTA demonstrating 4cm suprarenal aortic aneurysm with concern for inflammation vs infection Recommend lab work including blood cultures, CRP, ESR Will likely need admission to medicine service for control of HTN and monitoring of symptoms Dr. Carlis Abbott will evaluate the patient  and provide further treatment recommendations   Dagoberto Ligas PA-C [facility] [phone]  I have seen and evaluated the patient. I agree with the PA note as documented above. Calcified aorta with some mural thrombus in paravisceral segment and slight aneurysmal degeneration.  Stranding around supra-renal and paravisceral aorta subtle but appreciable on CT.  Would get ESR, CRP, blood cultures to r/o infectious etiology like aortitis given one week of abdominal pain and slight elevation of WBC.  Fairly benign exam in ED.  Would repeat CT abd/pelvis in 48 hours to ensure no overt degeneration.  Also needs aggressive BP control given Systolic 149+ in ED to see if this controls his pain.    [name], MD [facility] [place] Office: [phone] Pager: [place]

## 2017-12-27 NOTE — ED Triage Notes (Signed)
Pt states approx 3 days ago he was lifting heavy objects, and heard something pop in his groin area. States he has been in pain the entire time, and the pain is getting worse. Patients BP also elevated.

## 2017-12-28 DIAGNOSIS — I714 Abdominal aortic aneurysm, without rupture, unspecified: Secondary | ICD-10-CM | POA: Diagnosis present

## 2017-12-28 DIAGNOSIS — F1010 Alcohol abuse, uncomplicated: Secondary | ICD-10-CM | POA: Diagnosis present

## 2017-12-28 DIAGNOSIS — Z79899 Other long term (current) drug therapy: Secondary | ICD-10-CM | POA: Diagnosis not present

## 2017-12-28 DIAGNOSIS — I776 Arteritis, unspecified: Secondary | ICD-10-CM | POA: Diagnosis present

## 2017-12-28 DIAGNOSIS — N179 Acute kidney failure, unspecified: Secondary | ICD-10-CM | POA: Diagnosis present

## 2017-12-28 DIAGNOSIS — K219 Gastro-esophageal reflux disease without esophagitis: Secondary | ICD-10-CM | POA: Diagnosis present

## 2017-12-28 DIAGNOSIS — Z9114 Patient's other noncompliance with medication regimen: Secondary | ICD-10-CM | POA: Diagnosis not present

## 2017-12-28 DIAGNOSIS — I5042 Chronic combined systolic (congestive) and diastolic (congestive) heart failure: Secondary | ICD-10-CM | POA: Diagnosis present

## 2017-12-28 DIAGNOSIS — I161 Hypertensive emergency: Secondary | ICD-10-CM | POA: Diagnosis present

## 2017-12-28 DIAGNOSIS — I110 Hypertensive heart disease with heart failure: Secondary | ICD-10-CM | POA: Diagnosis present

## 2017-12-28 DIAGNOSIS — K5900 Constipation, unspecified: Secondary | ICD-10-CM | POA: Diagnosis present

## 2017-12-28 DIAGNOSIS — Z87891 Personal history of nicotine dependence: Secondary | ICD-10-CM | POA: Diagnosis not present

## 2017-12-28 DIAGNOSIS — E785 Hyperlipidemia, unspecified: Secondary | ICD-10-CM | POA: Diagnosis present

## 2017-12-28 DIAGNOSIS — R7982 Elevated C-reactive protein (CRP): Secondary | ICD-10-CM | POA: Diagnosis present

## 2017-12-28 LAB — CBC WITH DIFFERENTIAL/PLATELET
Abs Immature Granulocytes: 0.04 10*3/uL (ref 0.00–0.07)
BASOS ABS: 0 10*3/uL (ref 0.0–0.1)
Basophils Relative: 0 %
Eosinophils Absolute: 0.2 10*3/uL (ref 0.0–0.5)
Eosinophils Relative: 1 %
HEMATOCRIT: 41.3 % (ref 39.0–52.0)
Hemoglobin: 12.9 g/dL — ABNORMAL LOW (ref 13.0–17.0)
IMMATURE GRANULOCYTES: 0 %
Lymphocytes Relative: 17 %
Lymphs Abs: 2.1 10*3/uL (ref 0.7–4.0)
MCH: 28.7 pg (ref 26.0–34.0)
MCHC: 31.2 g/dL (ref 30.0–36.0)
MCV: 91.8 fL (ref 80.0–100.0)
Monocytes Absolute: 1.5 10*3/uL — ABNORMAL HIGH (ref 0.1–1.0)
Monocytes Relative: 12 %
NRBC: 0 % (ref 0.0–0.2)
Neutro Abs: 8.3 10*3/uL — ABNORMAL HIGH (ref 1.7–7.7)
Neutrophils Relative %: 70 %
Platelets: 222 10*3/uL (ref 150–400)
RBC: 4.5 MIL/uL (ref 4.22–5.81)
RDW: 12.7 % (ref 11.5–15.5)
WBC: 12 10*3/uL — ABNORMAL HIGH (ref 4.0–10.5)

## 2017-12-28 LAB — COMPREHENSIVE METABOLIC PANEL
ALT: 17 U/L (ref 0–44)
AST: 18 U/L (ref 15–41)
Albumin: 3.1 g/dL — ABNORMAL LOW (ref 3.5–5.0)
Alkaline Phosphatase: 63 U/L (ref 38–126)
Anion gap: 11 (ref 5–15)
BUN: 8 mg/dL (ref 6–20)
CO2: 25 mmol/L (ref 22–32)
Calcium: 8.5 mg/dL — ABNORMAL LOW (ref 8.9–10.3)
Chloride: 102 mmol/L (ref 98–111)
Creatinine, Ser: 1.01 mg/dL (ref 0.61–1.24)
GFR calc Af Amer: >60 mL/min (ref >60)
GFR calc non Af Amer: >60 mL/min (ref >60)
Glucose, Bld: 108 mg/dL — ABNORMAL HIGH (ref 70–99)
Potassium: 3.7 mmol/L (ref 3.5–5.1)
Sodium: 138 mmol/L (ref 135–145)
TOTAL PROTEIN: 6.2 g/dL — AB (ref 6.5–8.1)
Total Bilirubin: 1.3 mg/dL — ABNORMAL HIGH (ref 0.3–1.2)

## 2017-12-28 LAB — HIV ANTIBODY (ROUTINE TESTING W REFLEX): HIV Screen 4th Generation wRfx: NONREACTIVE

## 2017-12-28 MED ORDER — MAGNESIUM CITRATE PO SOLN: 1.0000 | Freq: Once | ORAL | Status: DC | PRN

## 2017-12-28 MED ORDER — HYDRALAZINE HCL 20 MG/ML IJ SOLN: 10.0000 mg | INTRAMUSCULAR | Status: DC | PRN

## 2017-12-28 MED ORDER — BISACODYL 10 MG RE SUPP: 10.0000 mg | Freq: Every day | RECTAL | Status: DC | PRN

## 2017-12-28 MED ORDER — POLYETHYLENE GLYCOL 3350 17 G PO PACK
17.0000 g | PACK | Freq: Two times a day (BID) | ORAL | Status: DC
  Administered 2017-12-28: 17 g via ORAL
  Filled 2017-12-28 (×4): qty 1

## 2017-12-28 MED ORDER — FUROSEMIDE 10 MG/ML IJ SOLN
20.0000 mg | Freq: Two times a day (BID) | INTRAMUSCULAR | Status: DC
  Administered 2017-12-28 – 2017-12-29 (×3): 20 mg via INTRAVENOUS
  Filled 2017-12-28 (×3): qty 2

## 2017-12-28 MED ORDER — ACETAMINOPHEN 500 MG PO TABS
500.0000 mg | ORAL_TABLET | Freq: Four times a day (QID) | ORAL | Status: DC | PRN
  Administered 2017-12-29: 500 mg via ORAL
  Filled 2017-12-28: qty 1

## 2017-12-28 MED ORDER — AMLODIPINE BESYLATE 5 MG PO TABS
5.0000 mg | ORAL_TABLET | Freq: Once | ORAL | Status: AC
  Administered 2017-12-28: 5 mg via ORAL
  Filled 2017-12-28: qty 1

## 2017-12-28 MED ORDER — LISINOPRIL 40 MG PO TABS
40.0000 mg | ORAL_TABLET | Freq: Every day | ORAL | Status: DC
  Administered 2017-12-29 – 2017-12-30 (×2): 40 mg via ORAL
  Filled 2017-12-28 (×2): qty 1

## 2017-12-28 MED ORDER — CLONIDINE HCL 0.1 MG PO TABS
0.1000 mg | ORAL_TABLET | Freq: Three times a day (TID) | ORAL | Status: DC
  Administered 2017-12-28 – 2017-12-31 (×10): 0.1 mg via ORAL
  Filled 2017-12-28 (×10): qty 1

## 2017-12-28 MED ORDER — AMLODIPINE BESYLATE 10 MG PO TABS
10.0000 mg | ORAL_TABLET | Freq: Every day | ORAL | Status: DC
  Administered 2017-12-29 – 2017-12-31 (×3): 10 mg via ORAL
  Filled 2017-12-28 (×3): qty 1

## 2017-12-28 MED ORDER — SENNOSIDES-DOCUSATE SODIUM 8.6-50 MG PO TABS
1.0000 | ORAL_TABLET | Freq: Two times a day (BID) | ORAL | Status: DC
  Administered 2017-12-28: 1 via ORAL
  Filled 2017-12-28 (×4): qty 1

## 2017-12-28 NOTE — Progress Notes (Signed)
[facility] [place]  Subjective  - No acute events overnight. States abdominal pain slightly improved with BP control.   Objective (!) 166/128 88 98.6 F (37 C) (Oral) 16 91%  Intake/Output Summary (Last 24 hours) at 12/28/2017 0809 Last data filed at 12/27/2017 2100 Gross per 24 hour  Intake 410 ml  Output -  Net 410 ml    Abdomen: mildly distended, no rebound or guarding, more discomfort in left lower abdomen this am.   Laboratory Lab Results: Recent Labs    12/27/17 0505 12/28/17 0313  WBC 12.1* 12.0*  HGB 13.9 12.9*  HCT 42.9 41.3  PLT 221 222   BMET Recent Labs    12/27/17 0505 12/28/17 0313  NA 139 138  K 3.6 3.7  CL 100 102  CO2 26 25  GLUCOSE 108* 108*  BUN 9 8  CREATININE 0.98 1.01  CALCIUM 8.5* 8.5*    COAG No results found for: INR, PROTIME No results found for: PTT  Assessment/Planning:  57 yo M admitted with abdominal pain and mild stranding around suprarenal aorta and small aneurysm.  No growth on blood cultures - will continue to monitor to r/o infectious etiology.  Will plan for repeat CT tomorrow to ensure no overt interval changes.  Aggressive bowel regimen would be helpful today since mildly distended and states he feels constipated and see if this relieves symptoms.  Aortic finding could be chronic but last CT 10+ years ago and not good comparison.    [name] 12/28/2017 8:09 AM --

## 2017-12-28 NOTE — Progress Notes (Signed)
TEAM 1 - Stepdown/ICU TEAM  Benjamin Hines  URK:[id number] DOB: Dec 03, 1960 DOA: 12/27/2017 PCP: Eulas Post, MD    Brief Narrative:  57 y.o. male w/ a hx of hyperlipidemia, hypertension, chronic combined systolic and diastolic heart failure, GERD, alcohol abuse, and tobacco abuse who presented w/ progressively worsening abdominal pain over a week.   In the ED CT abd/pelvis with contrast did not show any acute findings but did note aneurysmal dilatation of the aorta (4.0 x 4.2 cm) w/ extensive mural thickening and some subtle surrounding inflammatory changes suggesting a large vessel vasculitis.   Significant Events: 12/16 admit   Subjective: The patient reports that he feels better than at his admission but is not yet to baseline.  He reports some ongoing crampy lower abdominal pain.  He feels constipated.  He denies chest pain.  He does report a mild dry hacking cough.  He denies headache.  Assessment & Plan:  Hypertensive emergency BP not yet at goal - adjust tx and follow  Suprarenal AAA without rupture - ?Vasculitis   W/u to r/o infectious etiology underway - to have repeat CT 12/18 to assure stability   Abdominal pain Unclear if this is related to AAA, potential vasculitis, or simple constipation -avoid narcotics as able -accelerate bowel regimen  Alcohol abuse No evidence of active withdrawal at this time -continue Ativan withdrawal protocol PRN  Chronic combined systolic and diastolic congestive heart failure  TTE February 2019 noted EF 40-45% with grade 2 diastolic dysfunction -mildly volume overloaded on exam -diurese and follow  Tobacco use  Smoking cessation advised -counseled on link between tobacco abuse and vascular disease/AAA  Hyperlipidemia Statin therapy may be an issue if the patient continues to alcohol consumption -hold for now  DVT prophylaxis: SCDs Code Status: FULL CODE Family Communication: no family present at time of exam    Disposition Plan: SDU  Consultants:  Vasc Surgery   Antimicrobials:  none  Objective: Blood pressure (!) 177/137, pulse 88, temperature 98.6 F (37 C), temperature source Oral, resp. rate 16, height 5\' 9"  (1.753 m), weight 82.8 kg, SpO2 91 %.  Intake/Output Summary (Last 24 hours) at 12/28/2017 1048 Last data filed at 12/27/2017 2100 Gross per 24 hour  Intake 410 ml  Output -  Net 410 ml   Filed Weights   12/27/17 0459 12/27/17 1811 12/28/17 0611  Weight: 84.8 kg 82.3 kg 82.8 kg    Examination: General: No acute respiratory distress Lungs: Clear to auscultation bilaterally without wheezes or crackles Cardiovascular: Regular rate and rhythm without murmur gallop or rub normal S1 and S2 Abdomen: Soft, mildly distended, nonpulsatile, no mass, no rebound Extremities: 1+ bilateral lower extremity edema  CBC: Recent Labs  Lab 12/27/17 0505 12/28/17 0313  WBC 12.1* 12.0*  NEUTROABS  --  8.3*  HGB 13.9 12.9*  HCT 42.9 41.3  MCV 90.7 91.8  PLT 221 831   Basic Metabolic Panel: Recent Labs  Lab 12/27/17 0505 12/28/17 0313  NA 139 138  K 3.6 3.7  CL 100 102  CO2 26 25  GLUCOSE 108* 108*  BUN 9 8  CREATININE 0.98 1.01  CALCIUM 8.5* 8.5*  MG 1.9  --   PHOS 2.6  --    GFR: Estimated Creatinine Clearance: 78.8 mL/min (by C-G formula based on SCr of 1.01 mg/dL).  Liver Function Tests: Recent Labs  Lab 12/27/17 0505 12/28/17 0313  AST 23 18  ALT 16 17  ALKPHOS 70 63  BILITOT 0.9 1.3*  PROT 6.8 6.2*  ALBUMIN 3.3* 3.1*   Recent Labs  Lab 12/27/17 0505  LIPASE 32     Recent Results (from the past 240 hour(s))  Culture, blood (routine x 2)     Status: None (Preliminary result)   Collection Time: 12/27/17  6:40 PM  Result Value Ref Range Status   Specimen Description BLOOD RIGHT ANTECUBITAL  Final   Special Requests   Final    BOTTLES DRAWN AEROBIC ONLY Blood Culture adequate volume   Culture   Final    NO GROWTH < 12 HOURS Performed at [facility], [address] [place], [place] [address]    Report Status PENDING  Incomplete  Culture, blood (routine x 2)     Status: None (Preliminary result)   Collection Time: 12/27/17  6:48 PM  Result Value Ref Range Status   Specimen Description BLOOD RIGHT ANTECUBITAL  Final   Special Requests   Final    BOTTLES DRAWN AEROBIC ONLY Blood Culture adequate volume   Culture   Final    NO GROWTH < 12 HOURS Performed at [facility], [place] [address] [place], [place] [address]    Report Status PENDING  Incomplete     Scheduled Meds: . amLODipine  5 mg Oral Daily  . lisinopril  20 mg Oral Daily  . thiamine  100 mg Oral Daily    LOS: 0 days   Cherene Altes, MD [facility] Office  [phone] Pager - Text Page per Amion  If 7PM-7AM, please contact night-coverage per Amion 12/28/2017, 10:48 AM

## 2017-12-29 ENCOUNTER — Inpatient Hospital Stay (HOSPITAL_COMMUNITY): Payer: BLUE CROSS/BLUE SHIELD

## 2017-12-29 MED ORDER — IOPAMIDOL (ISOVUE-370) INJECTION 76%
INTRAVENOUS | Status: AC
  Filled 2017-12-29: qty 100

## 2017-12-29 MED ORDER — GUAIFENESIN-DM 100-10 MG/5ML PO SYRP
5.0000 mL | ORAL_SOLUTION | ORAL | Status: DC | PRN
  Administered 2017-12-29 – 2017-12-30 (×4): 5 mL via ORAL
  Filled 2017-12-29 (×4): qty 5

## 2017-12-29 MED ORDER — FUROSEMIDE 10 MG/ML IJ SOLN
20.0000 mg | Freq: Every day | INTRAMUSCULAR | Status: DC
  Administered 2017-12-30: 20 mg via INTRAVENOUS
  Filled 2017-12-29: qty 2

## 2017-12-29 MED ORDER — IOPAMIDOL (ISOVUE-370) INJECTION 76%
100.0000 mL | Freq: Once | INTRAVENOUS | Status: AC | PRN
  Administered 2017-12-29: 100 mL via INTRAVENOUS

## 2017-12-29 MED ORDER — HYDROCHLOROTHIAZIDE 25 MG PO TABS
25.0000 mg | ORAL_TABLET | Freq: Every day | ORAL | Status: DC
  Administered 2017-12-29 – 2017-12-30 (×2): 25 mg via ORAL
  Filled 2017-12-29 (×2): qty 1

## 2017-12-29 NOTE — Progress Notes (Signed)
Reviewed repeat CTA and no interval change that would warrant intervention at this time.  Will arrange follow-up in one month.  Can be discharged from vascular standpoint.    [name], MD [facility] [place] Office: [phone] Pager: [place]

## 2017-12-29 NOTE — Progress Notes (Signed)
[facility] [place]  Subjective  - No acute events overnight. States abdominal pain resolved with BM and lowering BP.  No concerns this am.  Wants to go home.   Objective (!) 154/123 93 98.7 F (37.1 C) (Oral) 20 97%  Intake/Output Summary (Last 24 hours) at 12/29/2017 0729 Last data filed at 12/29/2017 0415 Gross per 24 hour  Intake 360 ml  Output 775 ml  Net -415 ml    Abdomen: no tenderness  Laboratory Lab Results: Recent Labs    12/27/17 0505 12/28/17 0313  WBC 12.1* 12.0*  HGB 13.9 12.9*  HCT 42.9 41.3  PLT 221 222   BMET Recent Labs    12/27/17 0505 12/28/17 0313  NA 139 138  K 3.6 3.7  CL 100 102  CO2 26 25  GLUCOSE 108* 108*  BUN 9 8  CREATININE 0.98 1.01  CALCIUM 8.5* 8.5*    COAG No results found for: INR, PROTIME No results found for: PTT  Assessment/Planning:  57 yo M admitted with abdominal pain and mild stranding around suprarenal aorta and small aneurysm.  No growth on blood cultures to r/o infectious etiology.  Afebrile.  Abdominal pain resolved with aggressive control of BP and bowel regimen.  Will plan for repeat CT today and assuming to interval change ok with discharge home.  Appreciate assistance from hospitalist.    [name] 12/29/2017 7:29 AM --

## 2017-12-29 NOTE — Progress Notes (Signed)
PROGRESS NOTE    Benjamin Hines  SEG:[id number] DOB: [birth year] DOA: 12/27/2017 PCP: Eulas Post, MD   Brief Narrative: 57 year old male with history of hyperlipidemia, systolic and diastolic CHF, GERD, alcohol abuse, tobacco abuse who presented with progressively worsening abdominal pain over a week.  In the ER CT abdomen and pelvis was done that did not show any acute finding but did note aneurysmal dilation of the aorta for 4X4.2 cm with extensive mural thickening and some subtle surrounding inflammatory changes suggestive of large vessel vasculitis.  Patient was also found to have uncontrolled hypertension, was admitted for hypertensive emergency Since admission he is feeling better  Assessment & Plan:   Uncontrolled hypertension/HTN emergency:BP was uncontrolled, patient on amlodipine 10 mg, clonidine 0.1 mg 3 times daily, lisinopril 40 mg daily. I resumed his HCTZ at 25 mg daily.  NP now improving.  Monitor 1 more night.  Abdominal pain with suprarenal AAA without rupture,?  Vasculitis: Vascular surgery on board, repeat CTA obtained that shows "Hyperdense eccentric mural thickening in the distal descending thoracic and suprarenal abdominal aorta which is mildly aneurysmal, with surrounding inflammatory/edematous changes "vascular surgery reviewed the repeat CT scan today and has recommended for outpatient follow-up.Patient has leukocytosis but blood culture negative so far.HIV screen was negative.CRP elevated 11.8, but sed rate is normal at 10.  Tobacco abuse :cessation advised.  Hyperlipidemia: Continue statin  Chronic combined systolic and diastolic CHF, PVC (19 was 40 to 40% and grade 2 diastolic dysfunction.  He was mildly volume overloaded, needed diuretics.  Continue Lasix 20 mg IV daily,  Alcohol abuse:cessation advised.  No evidence of withdrawal currently.  DVT prophylaxis: scd Code Status: Code Family Communication:no family at bedside Disposition Plan: Home  tomorrow   Consultants: vascular surgery  Procedures: None  Antimicrobials: None  Subjective: Patient seen this afternoon still complaining of mild abdominal discomfort.  He was waiting for repeat CT abdomen.  BP has been uncontrolled mostly diastolic. No chest pain nausea vomiting fever chills.  Objective: Vitals:   12/29/17 0411 12/29/17 0941 12/29/17 1423 12/29/17 1700  BP: (!) 154/123 (!) 149/116 (!) 120/95 119/89  Pulse: 93  98   Resp: 20  16   Temp: 98.7 F (37.1 C)  98.7 F (37.1 C)   TempSrc: Oral  Oral   SpO2: 97%  95%   Weight:      Height:        Intake/Output Summary (Last 24 hours) at 12/29/2017 1803 Last data filed at 12/29/2017 1242 Gross per 24 hour  Intake 582 ml  Output 1225 ml  Net -643 ml   Filed Weights   12/27/17 1811 12/28/17 0611 12/29/17 0407  Weight: 82.3 kg 82.8 kg 82.1 kg   Weight change: -0.227 kg  Body mass index is 26.73 kg/m.  Examination:  General exam: Appears calm and comfortable ,Not in distress,average built HEENT:PERRL,Oral mucosa moist, Ear/Nose normal on gross exam Respiratory system: Bilateral equal air entry, normal vesicular breath sounds, no wheezes or crackles  Cardiovascular system: S1 & S2 heard,No JVD, murmurs.No pedal edema. Gastrointestinal system: Abdomen is  soft, non tender, non distended, BS +  Nervous System:Alert and oriented. No focal neurological deficits/moving extremities. Extremities: No edema, no clubbing,distal peripheral pulses palpable. Skin: No rashes, lesions,no icterus MSK: Normal muscle bulk,tone ,power  Data Reviewed: I have personally reviewed following labs and imaging studies  CBC: Recent Labs  Lab 12/27/17 0505 12/28/17 0313  WBC 12.1* 12.0*  NEUTROABS  --  8.3*  HGB 13.9  12.9*  HCT 42.9 41.3  MCV 90.7 91.8  PLT 221 811   Basic Metabolic Panel: Recent Labs  Lab 12/27/17 0505 12/28/17 0313  NA 139 138  K 3.6 3.7  CL 100 102  CO2 26 25  GLUCOSE 108* 108*  BUN 9 8    CREATININE 0.98 1.01  CALCIUM 8.5* 8.5*  MG 1.9  --   PHOS 2.6  --    GFR: Estimated Creatinine Clearance: 78.8 mL/min (by C-G formula based on SCr of 1.01 mg/dL). Liver Function Tests: Recent Labs  Lab 12/27/17 0505 12/28/17 0313  AST 23 18  ALT 16 17  ALKPHOS 70 63  BILITOT 0.9 1.3*  PROT 6.8 6.2*  ALBUMIN 3.3* 3.1*   Recent Labs  Lab 12/27/17 0505  LIPASE 32   No results for input(s): AMMONIA in the last 168 hours. Coagulation Profile: No results for input(s): INR, PROTIME in the last 168 hours. Cardiac Enzymes: No results for input(s): CKTOTAL, CKMB, CKMBINDEX, TROPONINI in the last 168 hours. BNP (last 3 results) Recent Labs    02/03/17 0747  PROBNP 164.0*   HbA1C: No results for input(s): HGBA1C in the last 72 hours. CBG: No results for input(s): GLUCAP in the last 168 hours. Lipid Profile: No results for input(s): CHOL, HDL, LDLCALC, TRIG, CHOLHDL, LDLDIRECT in the last 72 hours. Thyroid Function Tests: No results for input(s): TSH, T4TOTAL, FREET4, T3FREE, THYROIDAB in the last 72 hours. Anemia Panel: No results for input(s): VITAMINB12, FOLATE, FERRITIN, TIBC, IRON, RETICCTPCT in the last 72 hours. Sepsis Labs: No results for input(s): PROCALCITON, LATICACIDVEN in the last 168 hours.  Recent Results (from the past 240 hour(s))  Culture, blood (routine x 2)     Status: None (Preliminary result)   Collection Time: 12/27/17  6:40 PM  Result Value Ref Range Status   Specimen Description BLOOD RIGHT ANTECUBITAL  Final   Special Requests   Final    BOTTLES DRAWN AEROBIC ONLY Blood Culture adequate volume   Culture   Final    NO GROWTH 2 DAYS Performed at [facility], [address] [place], [place] [address]    Report Status PENDING  Incomplete  Culture, blood (routine x 2)     Status: None (Preliminary result)   Collection Time: 12/27/17  6:48 PM  Result Value Ref Range Status   Specimen Description BLOOD RIGHT ANTECUBITAL  Final   Special  Requests   Final    BOTTLES DRAWN AEROBIC ONLY Blood Culture adequate volume   Culture   Final    NO GROWTH 2 DAYS Performed at [facility], [place] [address], [place], [place] [address]    Report Status PENDING  Incomplete      Radiology Studies: Ct Angio Abd/pel W/ And/or W/o  Result Date: 12/29/2017 CLINICAL DATA:  Abdominal pain and mild stranding around suprarenal aorta and small aneurysm. No growth on blood cultures to r/o infectious etiology. Afebrile. Abdominal pain resolved with aggressive control of BP and bowel regimen EXAM: CTA ABDOMEN AND PELVIS WITH CONTRAST TECHNIQUE: Multidetector CT imaging of the abdomen and pelvis was performed using the standard protocol during bolus administration of intravenous contrast. Multiplanar reconstructed images and MIPs were obtained and reviewed to evaluate the vascular anatomy. CONTRAST:  169mL ISOVUE-370 IOPAMIDOL (ISOVUE-370) INJECTION 76% COMPARISON:  CT 12/27/2017 and previous FINDINGS: VASCULAR Aorta: The visualized distal descending thoracic segment is ectatic up to 3.4 cm diameter with mild atheromatous irregularity. Somewhat eccentric and hyperdense wall thickening extends to  the juxtarenal segment. Aneurysmal dilatation of the supraceliac segment up to 3.2 cm diameter. There are adjacent inflammatory/edematous changes in the retroperitoneal fat. This continues down to the level of the juxtarenal aorta, 2.4 cm diameter. There is moderate partially calcified atheromatous plaque in the infrarenal segment with mild fusiform ectasia up to 2.4 cm, tapering to a diameter of 1.9 cm just above the bifurcation. No stenosis. Celiac: Patent without evidence of aneurysm, dissection, vasculitis or significant stenosis. SMA: Patent without evidence of aneurysm, dissection, vasculitis or significant stenosis. Renals: Single left, widely patent.  Single right, widely patent. IMA: Short-segment origin stenosis related to aortic wall plaque, patent distally.  Inflow: Moderate partially calcified plaque without stenosis. Right common iliac artery ectatic up to 1.6 cm. Tortuous left common iliac artery. Proximal Outflow: Atheromatous but patent common femoral arteries and visualized portions of the SFA and deep femoral artery. Veins: No obvious venous abnormality within the limitations of this arterial phase study. Review of the MIP images confirms the above findings. NON-VASCULAR Lower chest: Mild four-chamber cardiac enlargement. Small left pleural effusion. Large eventration of the left diaphragmatic leaflet with adjacent atelectasis at the left lung base. Hepatobiliary: No focal liver abnormality is seen. No gallstones, gallbladder wall thickening, or biliary dilatation. Pancreas: Unremarkable. No pancreatic ductal dilatation or surrounding inflammatory changes. Spleen: Normal in size without focal abnormality. Adrenals/Urinary Tract: Normal adrenals. No hydronephrosis. Small bilateral renal cysts. Urinary bladder incompletely distended, mildly thick-walled. Stomach/Bowel: Stomach is nondilated. Small bowel decompressed. Normal appendix. The colon is nondilated, unremarkable. Lymphatic: No abdominal or pelvic adenopathy. Reproductive: Prostatic enlargement Other: No ascites.  Bilateral pelvic phleboliths.  No free air. Musculoskeletal: Degenerative disc disease L5-S1. Negative for fracture or other acute bone abnormality. IMPRESSION: VASCULAR 1. Hyperdense eccentric mural thickening in the distal descending thoracic and suprarenal abdominal aorta which is mildly aneurysmal, with surrounding inflammatory/edematous changes. Considerations include aortitis versus thrombosed dissection or penetrating atheromatous ulcer. Consider correlation with inflammatory markers, and CTA chest to evaluate thoracic aorta and great vessels. 2. No significant visceral or renal artery involvement. 3. Atherosclerotic 1.6 cm ectasia of the right common iliac artery, without stenosis.  NON-VASCULAR 1. Small left pleural effusion. 2. 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 Electronically Signed   By: Lucrezia Europe M.D.   On: 12/29/2017 16:15     Scheduled Meds: . amLODipine  10 mg Oral Daily  . cloNIDine  0.1 mg Oral TID  . [START ON 12/30/2017] furosemide  20 mg Intravenous Daily  . hydrochlorothiazide  25 mg Oral Daily  . iopamidol      . lisinopril  40 mg Oral Daily  . polyethylene glycol  17 g Oral BID  . senna-docusate  1 tablet Oral BID  . thiamine  100 mg Oral Daily   Continuous Infusions:   LOS: 1 day   Time spent: More than 50% of that time was spent in counseling and/or coordination of care.  Antonieta Pert, MD [facility] Pager [phone]  If 7PM-7AM, please contact night-coverage [email or web address] Password [facility] 12/29/2017, 6:03 PM

## 2017-12-30 ENCOUNTER — Telehealth: Payer: Self-pay | Admitting: Vascular Surgery

## 2017-12-30 ENCOUNTER — Other Ambulatory Visit: Payer: Self-pay

## 2017-12-30 ENCOUNTER — Inpatient Hospital Stay (HOSPITAL_COMMUNITY): Payer: BLUE CROSS/BLUE SHIELD

## 2017-12-30 DIAGNOSIS — I713 Abdominal aortic aneurysm, ruptured, unspecified: Secondary | ICD-10-CM

## 2017-12-30 LAB — CBC
HCT: 41.8 % (ref 39.0–52.0)
Hemoglobin: 13.2 g/dL (ref 13.0–17.0)
MCH: 28.9 pg (ref 26.0–34.0)
MCHC: 31.6 g/dL (ref 30.0–36.0)
MCV: 91.7 fL (ref 80.0–100.0)
Platelets: 256 10*3/uL (ref 150–400)
RBC: 4.56 MIL/uL (ref 4.22–5.81)
RDW: 12.5 % (ref 11.5–15.5)
WBC: 12.2 10*3/uL — ABNORMAL HIGH (ref 4.0–10.5)
nRBC: 0 % (ref 0.0–0.2)

## 2017-12-30 LAB — BASIC METABOLIC PANEL
ANION GAP: 11 (ref 5–15)
Anion gap: 13 (ref 5–15)
BUN: 16 mg/dL (ref 6–20)
BUN: 22 mg/dL — ABNORMAL HIGH (ref 6–20)
CO2: 22 mmol/L (ref 22–32)
CO2: 24 mmol/L (ref 22–32)
Calcium: 8.7 mg/dL — ABNORMAL LOW (ref 8.9–10.3)
Calcium: 8.6 mg/dL — ABNORMAL LOW (ref 8.9–10.3)
Chloride: 100 mmol/L (ref 98–111)
Chloride: 102 mmol/L (ref 98–111)
Creatinine, Ser: 1.29 mg/dL — ABNORMAL HIGH (ref 0.61–1.24)
Creatinine, Ser: 1.38 mg/dL — ABNORMAL HIGH (ref 0.61–1.24)
GFR calc Af Amer: >60 mL/min (ref >60)
GFR calc Af Amer: >60 mL/min (ref >60)
GFR calc non Af Amer: 56 mL/min — ABNORMAL LOW (ref >60)
Glucose, Bld: 135 mg/dL — ABNORMAL HIGH (ref 70–99)
Glucose, Bld: 94 mg/dL (ref 70–99)
POTASSIUM: 3.8 mmol/L (ref 3.5–5.1)
Potassium: 4.1 mmol/L (ref 3.5–5.1)
Sodium: 135 mmol/L (ref 135–145)
Sodium: 137 mmol/L (ref 135–145)

## 2017-12-30 LAB — HEPATIC FUNCTION PANEL
ALT: 24 U/L (ref 0–44)
AST: 27 U/L (ref 15–41)
Albumin: 3.1 g/dL — ABNORMAL LOW (ref 3.5–5.0)
Alkaline Phosphatase: 62 U/L (ref 38–126)
Bilirubin, Direct: 0.2 mg/dL (ref 0.0–0.2)
Indirect Bilirubin: 0.8 mg/dL (ref 0.3–0.9)
Total Bilirubin: 1 mg/dL (ref 0.3–1.2)
Total Protein: 6.6 g/dL (ref 6.5–8.1)

## 2017-12-30 MED ORDER — SODIUM CHLORIDE 0.9 % IV SOLN
INTRAVENOUS | Status: DC
  Administered 2017-12-30 – 2017-12-31 (×3): via INTRAVENOUS

## 2017-12-30 NOTE — Progress Notes (Signed)
PROGRESS NOTE    Benjamin Hines  ZSW:[id number] DOB: 17-Mar-1960 DOA: 12/27/2017 PCP: Eulas Post, MD   Brief Narrative: 57 year old male with history of hyperlipidemia, systolic and diastolic CHF, GERD, alcohol abuse, tobacco abuse who presented with progressively worsening abdominal pain over a week.  In the ER CT abdomen and pelvis was done that did not show any acute finding but did note aneurysmal dilation of the aorta for 4X4.2 cm with extensive mural thickening and some subtle surrounding inflammatory changes suggestive of large vessel vasculitis.  Patient was also found to have uncontrolled hypertension, was admitted for hypertensive emergency.Since admission he is feeling better.  Blood pressure has improved and has been on soft and lower side.  His creatinine is slowly trending up.  Patient is being monitored on IV fluids.  Assessment & Plan:   Uncontrolled hypertension/HTN emergency BP 207/133 on admission: BP was uncontrolled on admission, likely secondary to noncompliance.  Patient has been put back on his multiple home meds and blood pressure now down trended this afternoon.  Also complaining of cough, will D/C his lisinopril.  Add holding parameters.  He is on amlodipine 10 mg, clonidine 0.1 mg 3 times daily, and HCTZ at 25 mg.  Abdominal pain with suprarenal AAA without rupture,? Vasculitis: abdominal pain is much better. Seen by vascular surgery, repeat CTA obtained showed "Hyperdense eccentric mural thickening in the distal descending thoracic and suprarenal abdominal aorta which is mildly aneurysmal, with surrounding inflammatory/edematous changes " vascular surgery reviewed the repeat CT scan  and is planning for follow-up in 4 weeks with repeat CT scan and it has been scheduled. Patient has leukocytosis but blood culture negative so far.he has been afebrile.  HIV screen was negative. CRP elevated 11.8, but sed rate is normal at 10. May need outpatient referral to  rheumatology on discharge. Discussed with the patient.   Mild DUK:[id number] on admission 0.9, has trended to 1.29.  We will keep him on IV fluids NS AT 125 ML/HR as blood pressure is also soft. Repeat bmp.Of note patient has received IV contrast x2 in past 3 days, at risk of contrast nephropathy and BP also dropped. Hold off lisinopril (also c/o cough) and hold hctz. Added IVF. Monitor creatinine.  Tobacco abuse: Cessation advised.  Hyperlipidemia: Continue statin  Chronic combined systolic and diastolic CHF, PVC (19 was 40 to 40% and grade 2 diastolic dysfunction. Currently stable.  Stopped Lasix as creatinine is starting to bump.  Alcohol abuse:Cessation advised.  No evidence of withdrawal currently.  DVT prophylaxis: scd Code Status: Code Family Communication:no family at bedside Disposition Plan: Home 1-2 days if creatinine and BP remains stable. Tx to tele.   Consultants: vascular surgery  Procedures: None  Antimicrobials: None  Subjective: Patient seen this morning, Reports his abdominal pain is better, complains of some cough.  Patient reports he has not been taking his medication at home including lisinopril.  Chest x-ray was obtained that was unremarkable. His creatinine has up trended, patient has received CT scan x2 with IV contrast in last 2 days Pressure has been soft this afternoon  Objective: Vitals:   12/29/17 2126 12/30/17 0309 12/30/17 0956 12/30/17 1433  BP: 117/83 (!) 125/94 120/85 95/79  Pulse: 91 88  95  Resp: (!) 24 19    Temp: 99.5 F (37.5 C) 98.5 F (36.9 C)  98.4 F (36.9 C)  TempSrc: Oral Oral  Oral  SpO2: 97% 96%  100%  Weight:  80.3 kg    Height:  Intake/Output Summary (Last 24 hours) at 12/30/2017 1532 Last data filed at 12/30/2017 1500 Gross per 24 hour  Intake 876.38 ml  Output 175 ml  Net 701.38 ml   Filed Weights   12/28/17 0611 12/29/17 0407 12/30/17 0309  Weight: 82.8 kg 82.1 kg 80.3 kg   Weight change: -1.769 kg    Body mass index is 26.15 kg/m.  Examination:  General exam: Calm, comfortable not in acute distress, average built.  HEENT:Oral mucosa moist, Ear/Nose WNL grossly. Respiratory system: Bilateral equal air entry, no crackles and wheezing Cardiovascular system: S1 & S2 heard, No JVD/murmurs.No pedal edema. Gastrointestinal system: Abdomen soft, nontender non-distended, BS +. Nervous System:Alert/awake/oriented at baseline.Able to move UE and LE Extremities: No edema,distal peripheral pulses palpable. Skin: No rashes,no icterus. MSK: Normal muscle bulk,tone ,power.  Data Reviewed: I have personally reviewed following labs and imaging studies  CBC: Recent Labs  Lab 12/27/17 0505 12/28/17 0313 12/30/17 0327  WBC 12.1* 12.0* 12.2*  NEUTROABS  --  8.3*  --   HGB 13.9 12.9* 13.2  HCT 42.9 41.3 41.8  MCV 90.7 91.8 91.7  PLT 221 222 242   Basic Metabolic Panel: Recent Labs  Lab 12/27/17 0505 12/28/17 0313 12/30/17 0327  NA 139 138 135  K 3.6 3.7 4.1  CL 100 102 100  CO2 26 25 22   GLUCOSE 108* 108* 135*  BUN 9 8 16   CREATININE 0.98 1.01 1.29*  CALCIUM 8.5* 8.5* 8.7*  MG 1.9  --   --   PHOS 2.6  --   --    GFR: Estimated Creatinine Clearance: 61.7 mL/min (A) (by C-G formula based on SCr of 1.29 mg/dL (H)). Liver Function Tests: Recent Labs  Lab 12/27/17 0505 12/28/17 0313 12/30/17 0327  AST 23 18 27   ALT 16 17 24   ALKPHOS 70 63 62  BILITOT 0.9 1.3* 1.0  PROT 6.8 6.2* 6.6  ALBUMIN 3.3* 3.1* 3.1*   Recent Labs  Lab 12/27/17 0505  LIPASE 32   No results for input(s): AMMONIA in the last 168 hours. Coagulation Profile: No results for input(s): INR, PROTIME in the last 168 hours. Cardiac Enzymes: No results for input(s): CKTOTAL, CKMB, CKMBINDEX, TROPONINI in the last 168 hours. BNP (last 3 results) Recent Labs    02/03/17 0747  PROBNP 164.0*   HbA1C: No results for input(s): HGBA1C in the last 72 hours. CBG: No results for input(s): GLUCAP in the last  168 hours. Lipid Profile: No results for input(s): CHOL, HDL, LDLCALC, TRIG, CHOLHDL, LDLDIRECT in the last 72 hours. Thyroid Function Tests: No results for input(s): TSH, T4TOTAL, FREET4, T3FREE, THYROIDAB in the last 72 hours. Anemia Panel: No results for input(s): VITAMINB12, FOLATE, FERRITIN, TIBC, IRON, RETICCTPCT in the last 72 hours. Sepsis Labs: No results for input(s): PROCALCITON, LATICACIDVEN in the last 168 hours.  Recent Results (from the past 240 hour(s))  Culture, blood (routine x 2)     Status: None (Preliminary result)   Collection Time: 12/27/17  6:40 PM  Result Value Ref Range Status   Specimen Description BLOOD RIGHT ANTECUBITAL  Final   Special Requests   Final    BOTTLES DRAWN AEROBIC ONLY Blood Culture adequate volume   Culture   Final    NO GROWTH 3 DAYS Performed at [facility], [address], [place], [place] [address]    Report Status PENDING  Incomplete  Culture, blood (routine x 2)     Status: None (Preliminary result)   Collection Time: 12/27/17  6:48 PM  Result Value Ref Range Status   Specimen Description BLOOD RIGHT ANTECUBITAL  Final   Special Requests   Final    BOTTLES DRAWN AEROBIC ONLY Blood Culture adequate volume   Culture   Final    NO GROWTH 3 DAYS Performed at [facility], [address] [place], [place] [address]    Report Status PENDING  Incomplete      Radiology Studies: Dg Chest Port 1 View  Result Date: 12/30/2017 CLINICAL DATA:  Cough. EXAM: PORTABLE CHEST 1 VIEW COMPARISON:  February 03, 2017 FINDINGS: The left hemidiaphragm is elevated. Opacity adjacent to the left hemidiaphragm may simply represent atelectasis. Stable cardiomegaly. The tortuous thoracic aorta is stable in appearance. No other acute abnormalities. IMPRESSION: 1. Mild opacity adjacent to the elevated left hemidiaphragm is likely atelectasis. No other acute abnormalities. Electronically Signed   By: Dorise Bullion III M.D   On: 12/30/2017 12:41     Ct Angio Abd/pel W/ And/or W/o  Result Date: 12/29/2017 CLINICAL DATA:  Abdominal pain and mild stranding around suprarenal aorta and small aneurysm. No growth on blood cultures to r/o infectious etiology. Afebrile. Abdominal pain resolved with aggressive control of BP and bowel regimen EXAM: CTA ABDOMEN AND PELVIS WITH CONTRAST TECHNIQUE: Multidetector CT imaging of the abdomen and pelvis was performed using the standard protocol during bolus administration of intravenous contrast. Multiplanar reconstructed images and MIPs were obtained and reviewed to evaluate the vascular anatomy. CONTRAST:  174mL ISOVUE-370 IOPAMIDOL (ISOVUE-370) INJECTION 76% COMPARISON:  CT 12/27/2017 and previous FINDINGS: VASCULAR Aorta: The visualized distal descending thoracic segment is ectatic up to 3.4 cm diameter with mild atheromatous irregularity. Somewhat eccentric and hyperdense wall thickening extends to the juxtarenal segment. Aneurysmal dilatation of the supraceliac segment up to 3.2 cm diameter. There are adjacent inflammatory/edematous changes in the retroperitoneal fat. This continues down to the level of the juxtarenal aorta, 2.4 cm diameter. There is moderate partially calcified atheromatous plaque in the infrarenal segment with mild fusiform ectasia up to 2.4 cm, tapering to a diameter of 1.9 cm just above the bifurcation. No stenosis. Celiac: Patent without evidence of aneurysm, dissection, vasculitis or significant stenosis. SMA: Patent without evidence of aneurysm, dissection, vasculitis or significant stenosis. Renals: Single left, widely patent.  Single right, widely patent. IMA: Short-segment origin stenosis related to aortic wall plaque, patent distally. Inflow: Moderate partially calcified plaque without stenosis. Right common iliac artery ectatic up to 1.6 cm. Tortuous left common iliac artery. Proximal Outflow: Atheromatous but patent common femoral arteries and visualized portions of the SFA and deep  femoral artery. Veins: No obvious venous abnormality within the limitations of this arterial phase study. Review of the MIP images confirms the above findings. NON-VASCULAR Lower chest: Mild four-chamber cardiac enlargement. Small left pleural effusion. Large eventration of the left diaphragmatic leaflet with adjacent atelectasis at the left lung base. Hepatobiliary: No focal liver abnormality is seen. No gallstones, gallbladder wall thickening, or biliary dilatation. Pancreas: Unremarkable. No pancreatic ductal dilatation or surrounding inflammatory changes. Spleen: Normal in size without focal abnormality. Adrenals/Urinary Tract: Normal adrenals. No hydronephrosis. Small bilateral renal cysts. Urinary bladder incompletely distended, mildly thick-walled. Stomach/Bowel: Stomach is nondilated. Small bowel decompressed. Normal appendix. The colon is nondilated, unremarkable. Lymphatic: No abdominal or pelvic adenopathy. Reproductive: Prostatic enlargement Other: No ascites.  Bilateral pelvic phleboliths.  No free air. Musculoskeletal: Degenerative disc disease L5-S1. Negative for fracture or other acute bone abnormality. IMPRESSION: VASCULAR 1. Hyperdense eccentric mural thickening in the distal descending thoracic  and suprarenal abdominal aorta which is mildly aneurysmal, with surrounding inflammatory/edematous changes. Considerations include aortitis versus thrombosed dissection or penetrating atheromatous ulcer. Consider correlation with inflammatory markers, and CTA chest to evaluate thoracic aorta and great vessels. 2. No significant visceral or renal artery involvement. 3. Atherosclerotic 1.6 cm ectasia of the right common iliac artery, without stenosis. NON-VASCULAR 1. Small left pleural effusion. 2. 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 Electronically Signed   By: Lucrezia Europe M.D.   On: 12/29/2017 16:15     Scheduled Meds: . amLODipine  10 mg Oral Daily  .  cloNIDine  0.1 mg Oral TID  . hydrochlorothiazide  25 mg Oral Daily  . polyethylene glycol  17 g Oral BID  . senna-docusate  1 tablet Oral BID  . thiamine  100 mg Oral Daily   Continuous Infusions: . sodium chloride 125 mL/hr at 12/30/17 1500     LOS: 2 days   Time spent: More than 50% of that time was spent in counseling and/or coordination of care.  Antonieta Pert, MD [facility] Pager [phone]  If 7PM-7AM, please contact night-coverage [email or web address] Password [facility] 12/30/2017, 3:32 PM

## 2017-12-30 NOTE — Telephone Encounter (Signed)
sch appt spk to pt mld ltr 01/21/17 1130am CTA abd/pelvis 01/25/2018 4pm f/u MD

## 2017-12-30 NOTE — Telephone Encounter (Signed)
-----   Message from Gabriel Earing, [place] sent at 12/29/2017  8:35 AM EST ----- Will need f/u with Dr. Carlis Abbott with CTA abdomen/pelvis in 4 weeks.  Thanks

## 2017-12-31 ENCOUNTER — Ambulatory Visit: Payer: BLUE CROSS/BLUE SHIELD | Admitting: Family Medicine

## 2017-12-31 DIAGNOSIS — F1010 Alcohol abuse, uncomplicated: Secondary | ICD-10-CM

## 2017-12-31 DIAGNOSIS — K219 Gastro-esophageal reflux disease without esophagitis: Secondary | ICD-10-CM

## 2017-12-31 DIAGNOSIS — E785 Hyperlipidemia, unspecified: Secondary | ICD-10-CM

## 2017-12-31 DIAGNOSIS — I714 Abdominal aortic aneurysm, without rupture: Secondary | ICD-10-CM

## 2017-12-31 DIAGNOSIS — F17200 Nicotine dependence, unspecified, uncomplicated: Secondary | ICD-10-CM

## 2017-12-31 DIAGNOSIS — I5042 Chronic combined systolic (congestive) and diastolic (congestive) heart failure: Secondary | ICD-10-CM

## 2017-12-31 LAB — BASIC METABOLIC PANEL
Anion gap: 6 (ref 5–15)
BUN: 14 mg/dL (ref 6–20)
CO2: 27 mmol/L (ref 22–32)
Calcium: 8.6 mg/dL — ABNORMAL LOW (ref 8.9–10.3)
Chloride: 105 mmol/L (ref 98–111)
Creatinine, Ser: 1.12 mg/dL (ref 0.61–1.24)
GFR calc Af Amer: >60 mL/min (ref >60)
GFR calc non Af Amer: >60 mL/min (ref >60)
Glucose, Bld: 136 mg/dL — ABNORMAL HIGH (ref 70–99)
Potassium: 3.6 mmol/L (ref 3.5–5.1)
SODIUM: 138 mmol/L (ref 135–145)

## 2017-12-31 MED ORDER — CLONIDINE HCL 0.1 MG PO TABS: 0.1000 mg | ORAL_TABLET | Freq: Three times a day (TID) | ORAL | 2 refills | Status: DC

## 2017-12-31 MED ORDER — THIAMINE HCL 100 MG PO TABS: 100.0000 mg | ORAL_TABLET | Freq: Every day | ORAL | 0 refills | Status: DC

## 2017-12-31 MED ORDER — AMLODIPINE BESYLATE 10 MG PO TABS: 10.0000 mg | ORAL_TABLET | Freq: Every day | ORAL | 3 refills | Status: DC

## 2017-12-31 MED ORDER — ATORVASTATIN CALCIUM 20 MG PO TABS: 20.0000 mg | ORAL_TABLET | Freq: Every day | ORAL | 0 refills | Status: DC

## 2017-12-31 NOTE — Discharge Summary (Signed)
Physician Discharge Summary  Benjamin Hines PFX:[id number] DOB: 07-13-1960 DOA: 12/27/2017  PCP: Eulas Post, MD  Admit date: 12/27/2017 Discharge date: 12/31/2017  Admitted From: Home  Disposition: hOME   Recommendations for Outpatient Follow-up:  1. Follow up with PCP in 1-2 weeks 2. Please obtain BMP/CBC in one week 3. Please follow up with vascular surgery as recommended.  4. Please follow up with rheumatology in 1 to 2 weeks.      Discharge Condition:stable.  CODE STATUS: full code.  Diet recommendation: Heart Healthy   Brief/Interim Summary: 57 year old male with history of hyperlipidemia, systolic and diastolic CHF, GERD, alcohol abuse, tobacco abuse who presented with progressively worsening abdominal pain over a week.  In the ER CT abdomen and pelvis was done that did not show any acute finding but did note aneurysmal dilation of the aorta for 4X4.2 cm with extensive mural thickening and some subtle surrounding inflammatory changes suggestive of large vessel vasculitis.  Patient was also found to have uncontrolled hypertension, was admitted for hypertensive emergency.  Discharge Diagnoses:  Principal Problem:   Hypertensive emergency Active Problems:   GERD (gastroesophageal reflux disease)   Tobacco use disorder   Hyperlipidemia   Abdominal pain   Chronic combined systolic and diastolic congestive heart failure (HCC)   AAA (abdominal aortic aneurysm) without rupture (HCC)   Alcohol abuse   AAA (abdominal aortic aneurysm) ([name])  Uncontrolled hypertension/HTN emergency BP 207/133 on admission: BP was uncontrolled on admission, likely secondary to noncompliance.  Patient has been put back on his multiple home meds and blood pressure now down trended this afternoon.  Also complaining of cough, will D/C his lisinopril. Marland Kitchen  He is on amlodipine 10 mg, clonidine 0.1 mg 3 times daily,. HCTZ held for AKI.  Recommend follow up with PCP in one week and adjust medications  as needed.   Abdominal pain with suprarenal AAA without rupture,? Vasculitis: abdominal pain is much better. Seen by vascular surgery, repeat CTA obtained showed "Hyperdense eccentric mural thickening in the distal descending thoracic and suprarenal abdominal aorta which is mildly aneurysmal, with surrounding inflammatory/edematous changes " vascular surgery reviewed the repeat CT scan  and is planning for follow-up in 4 weeks with repeat CT scan and it has been scheduled. Patient has leukocytosis but blood culture negative so far.he has been afebrile.  HIV screen was negative. CRP elevated 11.8, but sed rate is normal at 10. May need outpatient referral to rheumatology after the vascular surgery follow up.   Mild HGD:JMEQASTMHD on admission 0.9, has trended to 1.29.  Hold off lisinopril (also c/o cough) and hold hctz. Repeat BMP in one week and restart medications once renal parameters are back to baseline.   Tobacco abuse: Cessation advised.  Hyperlipidemia: Continue statin  Chronic combined systolic and diastolic CHF, PVC (19 was 40 to 40% and grade 2 diastolic dysfunction. Currently stable.    Alcohol abuse:Cessation advised.  No evidence of withdrawal currently.   Discharge Instructions  Discharge Instructions    Diet - low sodium heart healthy   Complete by:  As directed    Discharge instructions   Complete by:  As directed    Follow up with PCP in one week.  Please check renal parameters in one week and if renal parameters are back to baseline, can restart the lisinopril.  Please follow up with vascular surgery as recommended.     Allergies as of 12/31/2017   No Known Allergies     Medication List  STOP taking these medications   hydrochlorothiazide 12.5 MG capsule Commonly known as:  MICROZIDE   lisinopril 40 MG tablet Commonly known as:  PRINIVIL,ZESTRIL     TAKE these medications   amLODipine 10 MG tablet Commonly known as:  NORVASC Take 1 tablet (10 mg  total) by mouth daily.   atorvastatin 20 MG tablet Commonly known as:  LIPITOR Take 1 tablet (20 mg total) by mouth daily.   cloNIDine 0.1 MG tablet Commonly known as:  CATAPRES Take 1 tablet (0.1 mg total) by mouth 3 (three) times daily.   thiamine 100 MG tablet Take 1 tablet (100 mg total) by mouth daily. Start taking on:  January 01, 2018      Follow-up Information    [name], MD In 4 weeks.   Specialty:  Vascular Surgery Why:  Office will call you to arrange your appt (sent) Contact information: [address] [place] [address] [phone]        Eulas Post, MD. Schedule an appointment as soon as possible for a visit in 1 week(s).   Specialty:  Family Medicine Why:  please check bmp in one week.  Contact information: [place] [address] [phone]          No Known Allergies  Consultations:  Vascular surgery.    Procedures/Studies: Ct Abdomen Pelvis W Contrast  Result Date: 12/27/2017 CLINICAL DATA:  57 year old male with history of pain extending from the epigastric region to the groin worsening over the past 1 and half weeks. Last bowel movement 3 days ago. EXAM: CT ABDOMEN AND PELVIS WITH CONTRAST TECHNIQUE: Multidetector CT imaging of the abdomen and pelvis was performed using the standard protocol following bolus administration of intravenous contrast. CONTRAST:  114mL OMNIPAQUE IOHEXOL 300 MG/ML  SOLN COMPARISON:  CT the abdomen and pelvis 03/09/2008. FINDINGS: Lower chest: Elevation of the left hemidiaphragm. Trace left pleural effusion. Mild cardiomegaly. Hepatobiliary: Mild diffuse low attenuation throughout the hepatic parenchyma, suggestive of hepatic steatosis. No suspicious cystic or solid hepatic lesions. No intra or extrahepatic biliary ductal dilatation. Gallbladder is normal in appearance. Pancreas: No pancreatic mass. No pancreatic ductal dilatation. No pancreatic or peripancreatic fluid or  inflammatory changes. Spleen: Unremarkable. Adrenals/Urinary Tract: Multiple low-attenuation lesions are noted in both kidneys, largest of which are compatible with simple cysts, measuring up to 1.6 cm in the upper pole of the left kidney. Other subcentimeter low-attenuation lesions are noted in both kidneys, too small to characterize, but statistically likely to represent tiny cysts. No hydroureteronephrosis. Urinary bladder is normal in appearance. Bilateral adrenal glands are normal in appearance. Stomach/Bowel: Normal appearance of the stomach. No pathologic dilatation of small bowel or colon. Normal appendix. Vascular/Lymphatic: Aortic atherosclerosis with fusiform aneurysmal dilatation of the abdominal aorta, most dilated in the suprarenal abdominal aorta at and immediately below the level of the aortic hiatus measuring up to 4.0 x 4.2 cm. There is extensive mural thickening throughout the upper abdominal aorta, as well as a small burden of atheromatous plaque. Small amount of soft tissue stranding in the para-aortic fat suggesting inflammation. Celiac axis, superior mesenteric artery and inferior mesenteric artery are all widely patent at this time. No lymphadenopathy noted in the abdomen or pelvis. Reproductive: Prostate gland and seminal vesicles are unremarkable in appearance. Other: No significant volume of ascites.  No pneumoperitoneum. Musculoskeletal: There are no aggressive appearing lytic or blastic lesions noted in the visualized portions of the skeleton. IMPRESSION: 1. No acute findings are noted in the  abdomen or pelvis. 2. However, there is aneurysmal dilatation of the abdominal aorta (4.0 x 4.2 cm), most severe at the level of the aortic hiatus an immediately below where there is also extensive mural thickening and some subtle surrounding inflammatory changes. Findings are strongly suggestive of a large vessel vasculitis. Further clinical evaluation is recommended. 3. Trace left pleural  effusion. 4. Severe elevation of the left hemidiaphragm. 5. Hepatic steatosis. Electronically Signed   By: Vinnie Langton M.D.   On: 12/27/2017 11:34   Dg Chest Port 1 View  Result Date: 12/30/2017 CLINICAL DATA:  Cough. EXAM: PORTABLE CHEST 1 VIEW COMPARISON:  February 03, 2017 FINDINGS: The left hemidiaphragm is elevated. Opacity adjacent to the left hemidiaphragm may simply represent atelectasis. Stable cardiomegaly. The tortuous thoracic aorta is stable in appearance. No other acute abnormalities. IMPRESSION: 1. Mild opacity adjacent to the elevated left hemidiaphragm is likely atelectasis. No other acute abnormalities. Electronically Signed   By: Dorise Bullion III M.D   On: 12/30/2017 12:41   Ct Angio Abd/pel W/ And/or W/o  Result Date: 12/29/2017 CLINICAL DATA:  Abdominal pain and mild stranding around suprarenal aorta and small aneurysm. No growth on blood cultures to r/o infectious etiology. Afebrile. Abdominal pain resolved with aggressive control of BP and bowel regimen EXAM: CTA ABDOMEN AND PELVIS WITH CONTRAST TECHNIQUE: Multidetector CT imaging of the abdomen and pelvis was performed using the standard protocol during bolus administration of intravenous contrast. Multiplanar reconstructed images and MIPs were obtained and reviewed to evaluate the vascular anatomy. CONTRAST:  156mL ISOVUE-370 IOPAMIDOL (ISOVUE-370) INJECTION 76% COMPARISON:  CT 12/27/2017 and previous FINDINGS: VASCULAR Aorta: The visualized distal descending thoracic segment is ectatic up to 3.4 cm diameter with mild atheromatous irregularity. Somewhat eccentric and hyperdense wall thickening extends to the juxtarenal segment. Aneurysmal dilatation of the supraceliac segment up to 3.2 cm diameter. There are adjacent inflammatory/edematous changes in the retroperitoneal fat. This continues down to the level of the juxtarenal aorta, 2.4 cm diameter. There is moderate partially calcified atheromatous plaque in the infrarenal  segment with mild fusiform ectasia up to 2.4 cm, tapering to a diameter of 1.9 cm just above the bifurcation. No stenosis. Celiac: Patent without evidence of aneurysm, dissection, vasculitis or significant stenosis. SMA: Patent without evidence of aneurysm, dissection, vasculitis or significant stenosis. Renals: Single left, widely patent.  Single right, widely patent. IMA: Short-segment origin stenosis related to aortic wall plaque, patent distally. Inflow: Moderate partially calcified plaque without stenosis. Right common iliac artery ectatic up to 1.6 cm. Tortuous left common iliac artery. Proximal Outflow: Atheromatous but patent common femoral arteries and visualized portions of the SFA and deep femoral artery. Veins: No obvious venous abnormality within the limitations of this arterial phase study. Review of the MIP images confirms the above findings. NON-VASCULAR Lower chest: Mild four-chamber cardiac enlargement. Small left pleural effusion. Large eventration of the left diaphragmatic leaflet with adjacent atelectasis at the left lung base. Hepatobiliary: No focal liver abnormality is seen. No gallstones, gallbladder wall thickening, or biliary dilatation. Pancreas: Unremarkable. No pancreatic ductal dilatation or surrounding inflammatory changes. Spleen: Normal in size without focal abnormality. Adrenals/Urinary Tract: Normal adrenals. No hydronephrosis. Small bilateral renal cysts. Urinary bladder incompletely distended, mildly thick-walled. Stomach/Bowel: Stomach is nondilated. Small bowel decompressed. Normal appendix. The colon is nondilated, unremarkable. Lymphatic: No abdominal or pelvic adenopathy. Reproductive: Prostatic enlargement Other: No ascites.  Bilateral pelvic phleboliths.  No free air. Musculoskeletal: Degenerative disc disease L5-S1. Negative for fracture or other acute bone abnormality. IMPRESSION: VASCULAR  1. Hyperdense eccentric mural thickening in the distal descending thoracic and  suprarenal abdominal aorta which is mildly aneurysmal, with surrounding inflammatory/edematous changes. Considerations include aortitis versus thrombosed dissection or penetrating atheromatous ulcer. Consider correlation with inflammatory markers, and CTA chest to evaluate thoracic aorta and great vessels. 2. No significant visceral or renal artery involvement. 3. Atherosclerotic 1.6 cm ectasia of the right common iliac artery, without stenosis. NON-VASCULAR 1. Small left pleural effusion. 2. 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 Electronically Signed   By: Lucrezia Europe M.D.   On: 12/29/2017 16:15       Subjective: No chest pain or sob.   Discharge Exam: Vitals:   12/31/17 1040 12/31/17 1151  BP: 123/85 107/88  Pulse:  (!) 102  Resp:  20  Temp:  99 F (37.2 C)  SpO2:  98%   Vitals:   12/30/17 1938 12/31/17 0455 12/31/17 1040 12/31/17 1151  BP: 116/88 122/88 123/85 107/88  Pulse: 95 90  (!) 102  Resp:    20  Temp:    99 F (37.2 C)  TempSrc:    Oral  SpO2: 98% 100%  98%  Weight:  80 kg    Height:        General: Pt is alert, awake, not in acute distress Cardiovascular: RRR, S1/S2 +, no rubs, no gallops Respiratory: CTA bilaterally, no wheezing, no rhonchi Abdominal: Soft, NT, ND, bowel sounds + Extremities: no edema, no cyanosis    The results of significant diagnostics from this hospitalization (including imaging, microbiology, ancillary and laboratory) are listed below for reference.     Microbiology: Recent Results (from the past 240 hour(s))  Culture, blood (routine x 2)     Status: None (Preliminary result)   Collection Time: 12/27/17  6:40 PM  Result Value Ref Range Status   Specimen Description BLOOD RIGHT ANTECUBITAL  Final   Special Requests   Final    BOTTLES DRAWN AEROBIC ONLY Blood Culture adequate volume   Culture   Final    NO GROWTH 4 DAYS Performed at [facility], [address] [place], [place]  [address]    Report Status PENDING  Incomplete  Culture, blood (routine x 2)     Status: None (Preliminary result)   Collection Time: 12/27/17  6:48 PM  Result Value Ref Range Status   Specimen Description BLOOD RIGHT ANTECUBITAL  Final   Special Requests   Final    BOTTLES DRAWN AEROBIC ONLY Blood Culture adequate volume   Culture   Final    NO GROWTH 4 DAYS Performed at [facility], [place] [address] [place], [place] [address]    Report Status PENDING  Incomplete     Labs: BNP (last 3 results) No results for input(s): BNP in the last 8760 hours. Basic Metabolic Panel: Recent Labs  Lab 12/27/17 0505 12/28/17 0313 12/30/17 0327 12/30/17 1624 12/31/17 0955  NA 139 138 135 137 138  K 3.6 3.7 4.1 3.8 3.6  CL 100 102 100 102 105  CO2 26 25 22 24 27   GLUCOSE 108* 108* 135* 94 136*  BUN 9 8 16  22* 14  CREATININE 0.98 1.01 1.29* 1.38* 1.12  CALCIUM 8.5* 8.5* 8.7* 8.6* 8.6*  MG 1.9  --   --   --   --   PHOS 2.6  --   --   --   --    Liver Function Tests: Recent Labs  Lab 12/27/17 0505 12/28/17 0313  12/30/17 0327  AST 23 18 27   ALT 16 17 24   ALKPHOS 70 63 62  BILITOT 0.9 1.3* 1.0  PROT 6.8 6.2* 6.6  ALBUMIN 3.3* 3.1* 3.1*   Recent Labs  Lab 12/27/17 0505  LIPASE 32   No results for input(s): AMMONIA in the last 168 hours. CBC: Recent Labs  Lab 12/27/17 0505 12/28/17 0313 12/30/17 0327  WBC 12.1* 12.0* 12.2*  NEUTROABS  --  8.3*  --   HGB 13.9 12.9* 13.2  HCT 42.9 41.3 41.8  MCV 90.7 91.8 91.7  PLT 221 222 256   Cardiac Enzymes: No results for input(s): CKTOTAL, CKMB, CKMBINDEX, TROPONINI in the last 168 hours. BNP: Invalid input(s): POCBNP CBG: No results for input(s): GLUCAP in the last 168 hours. D-Dimer No results for input(s): DDIMER in the last 72 hours. Hgb A1c No results for input(s): HGBA1C in the last 72 hours. Lipid Profile No results for input(s): CHOL, HDL, LDLCALC, TRIG, CHOLHDL, LDLDIRECT in the last 72 hours. Thyroid function  studies No results for input(s): TSH, T4TOTAL, T3FREE, THYROIDAB in the last 72 hours.  Invalid input(s): FREET3 Anemia work up No results for input(s): VITAMINB12, FOLATE, FERRITIN, TIBC, IRON, RETICCTPCT in the last 72 hours. Urinalysis    Component Value Date/Time   COLORURINE YELLOW 12/27/2017 0500   APPEARANCEUR CLOUDY (A) 12/27/2017 0500   LABSPEC 1.014 12/27/2017 0500   PHURINE 8.0 12/27/2017 0500   GLUCOSEU NEGATIVE 12/27/2017 0500   HGBUR NEGATIVE 12/27/2017 0500   BILIRUBINUR NEGATIVE 12/27/2017 0500   BILIRUBINUR n 04/26/2013 1230   KETONESUR NEGATIVE 12/27/2017 0500   PROTEINUR NEGATIVE 12/27/2017 0500   UROBILINOGEN 0.2 04/26/2013 1230   NITRITE NEGATIVE 12/27/2017 0500   LEUKOCYTESUR NEGATIVE 12/27/2017 0500   Sepsis Labs Invalid input(s): PROCALCITONIN,  WBC,  LACTICIDVEN Microbiology Recent Results (from the past 240 hour(s))  Culture, blood (routine x 2)     Status: None (Preliminary result)   Collection Time: 12/27/17  6:40 PM  Result Value Ref Range Status   Specimen Description BLOOD RIGHT ANTECUBITAL  Final   Special Requests   Final    BOTTLES DRAWN AEROBIC ONLY Blood Culture adequate volume   Culture   Final    NO GROWTH 4 DAYS Performed at [facility], [place] [address], [place], [place] [address]    Report Status PENDING  Incomplete  Culture, blood (routine x 2)     Status: None (Preliminary result)   Collection Time: 12/27/17  6:48 PM  Result Value Ref Range Status   Specimen Description BLOOD RIGHT ANTECUBITAL  Final   Special Requests   Final    BOTTLES DRAWN AEROBIC ONLY Blood Culture adequate volume   Culture   Final    NO GROWTH 4 DAYS Performed at [facility], [place] [address], [place], [place] [address]    Report Status PENDING  Incomplete     Time coordinating discharge: 36  minutes  SIGNED:   Hosie Poisson, MD  [facility] 12/31/2017, 1:12 PM Pager   If 7PM-7AM, please contact  night-coverage [email or web address] Password [email or web address]

## 2017-12-31 NOTE — Progress Notes (Addendum)
Discharge instructions reviewed with pt. Pt has no questions at this time. Pt knows to take clonidine 2 more times today. Pt states he is ready for discharge. Have spoken with Dr. Karleen Hampshire concerning discharge. Pt refusing to be wheeled out. Pt says he will call to make his follow up appointments. Educated pt on where to pick up his new medications. Pt states he has transportation via cab.

## 2018-01-01 LAB — CULTURE, BLOOD (ROUTINE X 2)
Culture: NO GROWTH
Culture: NO GROWTH
Special Requests: ADEQUATE
Special Requests: ADEQUATE

## 2018-01-02 ENCOUNTER — Emergency Department (HOSPITAL_COMMUNITY): Payer: BLUE CROSS/BLUE SHIELD

## 2018-01-02 ENCOUNTER — Emergency Department (HOSPITAL_COMMUNITY)
Admission: EM | Admit: 2018-01-02 | Discharge: 2018-01-02 | Disposition: A | Discharge status: Home / Self Care | Payer: BLUE CROSS/BLUE SHIELD | Attending: Emergency Medicine | Admitting: Emergency Medicine

## 2018-01-02 ENCOUNTER — Encounter (HOSPITAL_COMMUNITY): Payer: Self-pay | Admitting: Emergency Medicine

## 2018-01-02 ENCOUNTER — Other Ambulatory Visit: Payer: Self-pay

## 2018-01-02 DIAGNOSIS — M545 Low back pain, unspecified: Secondary | ICD-10-CM

## 2018-01-02 DIAGNOSIS — I5042 Chronic combined systolic (congestive) and diastolic (congestive) heart failure: Secondary | ICD-10-CM | POA: Diagnosis not present

## 2018-01-02 DIAGNOSIS — Z87891 Personal history of nicotine dependence: Secondary | ICD-10-CM | POA: Diagnosis not present

## 2018-01-02 DIAGNOSIS — I110 Hypertensive heart disease with heart failure: Secondary | ICD-10-CM | POA: Diagnosis not present

## 2018-01-02 DIAGNOSIS — I10 Essential (primary) hypertension: Secondary | ICD-10-CM

## 2018-01-02 DIAGNOSIS — I714 Abdominal aortic aneurysm, without rupture, unspecified: Secondary | ICD-10-CM

## 2018-01-02 DIAGNOSIS — E785 Hyperlipidemia, unspecified: Secondary | ICD-10-CM | POA: Diagnosis not present

## 2018-01-02 LAB — CBC WITH DIFFERENTIAL/PLATELET
Abs Immature Granulocytes: 0.04 10*3/uL (ref 0.00–0.07)
BASOS ABS: 0.1 10*3/uL (ref 0.0–0.1)
Basophils Relative: 1 %
Eosinophils Absolute: 0.1 10*3/uL (ref 0.0–0.5)
Eosinophils Relative: 1 %
HCT: 39.7 % (ref 39.0–52.0)
Hemoglobin: 12.7 g/dL — ABNORMAL LOW (ref 13.0–17.0)
Immature Granulocytes: 0 %
Lymphocytes Relative: 18 %
Lymphs Abs: 1.8 10*3/uL (ref 0.7–4.0)
MCH: 29 pg (ref 26.0–34.0)
MCHC: 32 g/dL (ref 30.0–36.0)
MCV: 90.6 fL (ref 80.0–100.0)
Monocytes Absolute: 1.1 10*3/uL — ABNORMAL HIGH (ref 0.1–1.0)
Monocytes Relative: 11 %
NEUTROS ABS: 7.2 10*3/uL (ref 1.7–7.7)
NRBC: 0 % (ref 0.0–0.2)
Neutrophils Relative %: 69 %
PLATELETS: 332 10*3/uL (ref 150–400)
RBC: 4.38 MIL/uL (ref 4.22–5.81)
RDW: 11.8 % (ref 11.5–15.5)
WBC: 10.3 10*3/uL (ref 4.0–10.5)

## 2018-01-02 LAB — COMPREHENSIVE METABOLIC PANEL
ALT: 46 U/L — ABNORMAL HIGH (ref 0–44)
AST: 39 U/L (ref 15–41)
Albumin: 2.9 g/dL — ABNORMAL LOW (ref 3.5–5.0)
Alkaline Phosphatase: 68 U/L (ref 38–126)
Anion gap: 10 (ref 5–15)
BUN: 10 mg/dL (ref 6–20)
CHLORIDE: 104 mmol/L (ref 98–111)
CO2: 21 mmol/L — AB (ref 22–32)
Calcium: 8.7 mg/dL — ABNORMAL LOW (ref 8.9–10.3)
Creatinine, Ser: 0.96 mg/dL (ref 0.61–1.24)
GFR calc Af Amer: >60 mL/min (ref >60)
GFR calc non Af Amer: >60 mL/min (ref >60)
GLUCOSE: 110 mg/dL — AB (ref 70–99)
Potassium: 4.2 mmol/L (ref 3.5–5.1)
Sodium: 135 mmol/L (ref 135–145)
Total Bilirubin: 0.6 mg/dL (ref 0.3–1.2)
Total Protein: 6.8 g/dL (ref 6.5–8.1)

## 2018-01-02 LAB — URINALYSIS, ROUTINE W REFLEX MICROSCOPIC
Bilirubin Urine: NEGATIVE
Glucose, UA: NEGATIVE mg/dL
Hgb urine dipstick: NEGATIVE
Ketones, ur: NEGATIVE mg/dL
Leukocytes, UA: NEGATIVE
Nitrite: NEGATIVE
Protein, ur: NEGATIVE mg/dL
SPECIFIC GRAVITY, URINE: 1.025 (ref 1.005–1.030)
pH: 6 (ref 5.0–8.0)

## 2018-01-02 LAB — I-STAT TROPONIN, ED: Troponin i, poc: 0.04 ng/mL (ref 0.00–0.08)

## 2018-01-02 MED ORDER — CLONIDINE HCL 0.1 MG PO TABS
0.1000 mg | ORAL_TABLET | Freq: Once | ORAL | Status: AC
  Administered 2018-01-02: 0.1 mg via ORAL
  Filled 2018-01-02: qty 1

## 2018-01-02 MED ORDER — AMLODIPINE BESYLATE 5 MG PO TABS
10.0000 mg | ORAL_TABLET | Freq: Once | ORAL | Status: AC
  Administered 2018-01-02: 10 mg via ORAL
  Filled 2018-01-02: qty 2

## 2018-01-02 MED ORDER — IOPAMIDOL (ISOVUE-370) INJECTION 76%
100.0000 mL | Freq: Once | INTRAVENOUS | Status: AC | PRN
  Administered 2018-01-02: 100 mL via INTRAVENOUS

## 2018-01-02 NOTE — ED Provider Notes (Signed)
[facility] EMERGENCY DEPARTMENT Provider Note   CSN: [id number] Arrival date & time: 01/02/18  0305     History   Chief Complaint Chief Complaint  Patient presents with  . Hypertension    HPI Benjamin Hines is a 57 y.o. male.  HPI  This is a 57 year old male with a history of hypertension, hyperlipidemia, recent diagnosis of AAA who presents with high blood pressure.  Patient reports he has been trying to take his blood pressure daily.  He was just admitted for hypertensive urgency.  He was started on amlodipine and clonidine.  He reports that he has been taking them as scheduled.  He woke up this morning and took his blood pressure.  At that time he was having some left-sided back pain.  Rates his pain at 6 out of 10.  It is nonradiating.  Denies any urinary symptoms, nausea, vomiting.  He states that his blood pressure this morning was 200s over 180s.  He denies any chest pain, shortness of breath, headache.  Did report some tingling in his right arm.  Denies any weakness.  Past Medical History:  Diagnosis Date  . AAA (abdominal aortic aneurysm) without rupture ([place]) 12/27/2017  . Hyperlipidemia   . Hypertension     Patient Active Problem List   Diagnosis Date Noted  . AAA (abdominal aortic aneurysm) ([place]) 12/28/2017  . Hypertensive emergency 12/27/2017  . Abdominal pain 12/27/2017  . Chronic combined systolic and diastolic congestive heart failure ([place]) 12/27/2017  . AAA (abdominal aortic aneurysm) without rupture ([place]) 12/27/2017  . Alcohol abuse 12/27/2017  . Hyperlipidemia 02/09/2017  . Erectile dysfunction 10/30/2013  . Physical exam, annual 04/06/2010  . Tobacco use disorder 04/06/2010  . HTN (hypertension) 02/25/2010  . GERD (gastroesophageal reflux disease) 02/25/2010  . HEMATOCHEZIA 03/13/2008    Past Surgical History:  Procedure Laterality Date  . FOOT SURGERY     left foot        Home Medications    Prior to Admission  medications   Medication Sig Start Date End Date Taking? Authorizing Provider  amLODipine (NORVASC) 10 MG tablet Take 1 tablet (10 mg total) by mouth daily. 12/31/17  Yes Hosie Poisson, MD  atorvastatin (LIPITOR) 20 MG tablet Take 1 tablet (20 mg total) by mouth daily. 12/31/17  Yes Hosie Poisson, MD  cloNIDine (CATAPRES) 0.1 MG tablet Take 1 tablet (0.1 mg total) by mouth 3 (three) times daily. 12/31/17  Yes Hosie Poisson, MD  thiamine 100 MG tablet Take 1 tablet (100 mg total) by mouth daily. 01/01/18  Yes Hosie Poisson, MD    Family History Family History  Problem Relation Age of Onset  . Dementia Mother   . Hypertension Mother   . CAD Father     Social History Social History   Tobacco Use  . Smoking status: Former Smoker    Packs/day: 1.00  . Smokeless tobacco: Never Used  Substance Use Topics  . Alcohol use: Yes    Alcohol/week: 0.0 standard drinks    Comment: 4-5 beers per night  . Drug use: No     Allergies   Patient has no known allergies.   Review of Systems Review of Systems  Constitutional: Negative for fever.  Respiratory: Negative for shortness of breath.   Cardiovascular: Positive for chest pain.  Gastrointestinal: Negative for abdominal pain, nausea and vomiting.  Genitourinary: Negative for dysuria and hematuria.  Musculoskeletal: Positive for back pain.  Neurological: Negative for weakness and numbness.  Right arm tingling  All other systems reviewed and are negative.    Physical Exam Updated Vital Signs BP (!) 131/96   Pulse 89   Temp 98.8 F (37.1 C) (Oral)   Resp (!) 21   SpO2 96%   Physical Exam Vitals signs and nursing note reviewed.  Constitutional:      Appearance: He is well-developed. He is not ill-appearing or toxic-appearing.  HENT:     Head: Normocephalic and atraumatic.     Mouth/Throat:     Mouth: Mucous membranes are moist.  Eyes:     Pupils: Pupils are equal, round, and reactive to light.  Neck:      Musculoskeletal: Normal range of motion and neck supple.  Cardiovascular:     Rate and Rhythm: Normal rate and regular rhythm.     Pulses: Normal pulses.     Heart sounds: Normal heart sounds. No murmur.     Comments: Pulses normal in all 4 extremities Pulmonary:     Effort: Pulmonary effort is normal. No respiratory distress.     Breath sounds: Normal breath sounds. No wheezing.  Abdominal:     General: Bowel sounds are normal.     Palpations: Abdomen is soft.     Tenderness: There is abdominal tenderness. There is no rebound.     Comments: Slight epigastric tenderness to palpation, no rebound or guarding noted  Lymphadenopathy:     Cervical: No cervical adenopathy.  Skin:    General: Skin is warm and dry.  Neurological:     Mental Status: He is alert and oriented to person, place, and time.     Comments: Cranial nerves II through XII intact, fluent speech, 5 out of 5 strength in all 4 extremities      ED Treatments / Results  Labs (all labs ordered are listed, but only abnormal results are displayed) Labs Reviewed  CBC WITH DIFFERENTIAL/PLATELET - Abnormal; Notable for the following components:      Result Value   Hemoglobin 12.7 (*)    Monocytes Absolute 1.1 (*)    All other components within normal limits  COMPREHENSIVE METABOLIC PANEL - Abnormal; Notable for the following components:   CO2 21 (*)    Glucose, Bld 110 (*)    Calcium 8.7 (*)    Albumin 2.9 (*)    ALT 46 (*)    All other components within normal limits  URINALYSIS, ROUTINE W REFLEX MICROSCOPIC  I-STAT TROPONIN, ED    EKG EKG Interpretation  Date/Time:  Sunday January 02 2018 03:35:47 EST Ventricular Rate:  94 PR Interval:    QRS Duration: 100 QT Interval:  355 QTC Calculation: 444 R Axis:   2 Text Interpretation:  Sinus tachycardia Atrial premature complexes in couplets Probable left atrial enlargement Abnormal R-wave progression, early transition Left ventricular hypertrophy Nonspecific T  abnrm, anterolateral leads Confirmed by Thayer Jew [phone]) on 01/02/2018 5:25:57 AM   Radiology Ct Angio Abd/pel W And/or Wo Contrast  Result Date: 01/02/2018 CLINICAL DATA:  Acute onset of intermittent upper back pain. High blood pressure. Recently admitted for aortic aneurysm or aortitis. EXAM: CTA ABDOMEN AND PELVIS wITHOUT AND WITH CONTRAST TECHNIQUE: Multidetector CT imaging of the abdomen and pelvis was performed using the standard protocol during bolus administration of intravenous contrast. Multiplanar reconstructed images and MIPs were obtained and reviewed to evaluate the vascular anatomy. CONTRAST:  154mL ISOVUE-370 IOPAMIDOL (ISOVUE-370) INJECTION 76% COMPARISON:  CTA of the abdomen and pelvis performed 12/29/2017 FINDINGS: VASCULAR Aorta: There is persistent  soft tissue inflammation tracking along the course of the distal descending thoracic and suprarenal abdominal aorta, similar in appearance to the prior study. This is concerning for aortitis. As before, there is focal aneurysmal dilatation of the proximal abdominal aorta to 3.7 cm in AP dimension; underlying penetrating atheromatous ulcer cannot be excluded. Celiac: The celiac trunk remains patent, though mild luminal narrowing is noted at the proximal celiac trunk. SMA: The superior mesenteric artery remains patent. Renals: The renal arteries appear patent bilaterally. IMA: The inferior mesenteric artery remains grossly patent, though mild luminal narrowing is noted at the proximal inferior mesenteric artery. Inflow: Scattered calcification is noted along the common, internal and external iliac arteries bilaterally, with mild luminal narrowing along the left external iliac artery. Proximal Outflow: Scattered calcification is noted along the common femoral arteries and their proximal branches bilaterally. Veins: Visualized venous structures are grossly unremarkable in appearance. Review of the MIP images confirms the above findings.  NON-VASCULAR Lower chest: A trace left pleural effusion is noted, with associated atelectasis. The visualized portions of the mediastinum are grossly unremarkable. Hepatobiliary: The liver is unremarkable in appearance. The gallbladder is unremarkable in appearance. The common bile duct remains normal in caliber. Pancreas: The pancreas is within normal limits. Spleen: The spleen is unremarkable in appearance. Adrenals/Urinary Tract: The adrenal glands are unremarkable in appearance. Mild bilateral renal scarring is noted. Scattered bilateral renal cysts are seen. There is no evidence of hydronephrosis. No renal or ureteral stones are identified. Mild perinephric stranding is noted bilaterally. Stomach/Bowel: The stomach is unremarkable in appearance. The small bowel is within normal limits. The appendix is normal in caliber, without evidence of appendicitis. The colon is unremarkable in appearance. Lymphatic: No retroperitoneal or pelvic sidewall lymphadenopathy is seen. Reproductive: The bladder is relatively decompressed and grossly unremarkable. The prostate is mildly enlarged, measuring 5.0 cm in transverse dimension. Other: No additional soft tissue abnormalities are seen. Musculoskeletal: No acute osseous abnormalities are identified. The visualized musculature is unremarkable in appearance. IMPRESSION: VASCULAR 1. Persistent soft tissue inflammation tracking along the course of the distal descending thoracic and suprarenal abdominal aorta, similar in appearance to the prior study. This is concerning for aortitis. 2. Focal aneurysmal dilatation of the proximal abdominal aorta to 3.7 cm in AP dimension. Underlying penetrating atheromatous ulcer cannot be excluded. 3. Mild luminal narrowing at the proximal celiac trunk. NON-VASCULAR 1. Trace left pleural effusion, with associated atelectasis. 2. Mild bilateral renal scarring. Scattered bilateral renal cysts seen. 3. Mildly enlarged prostate. Electronically  Signed   By: Garald Balding M.D.   On: 01/02/2018 06:33    Procedures Procedures (including critical care time)  Medications Ordered in ED Medications  amLODipine (NORVASC) tablet 10 mg (10 mg Oral Given 01/02/18 0430)  cloNIDine (CATAPRES) tablet 0.1 mg (0.1 mg Oral Given 01/02/18 0430)  iopamidol (ISOVUE-370) 76 % injection 100 mL (100 mLs Intravenous Contrast Given 01/02/18 0548)     Initial Impression / Assessment and Plan / ED Course  I have reviewed the triage vital signs and the nursing notes.  Pertinent labs & imaging results that were available during my care of the patient were reviewed by me and considered in my medical decision making (see chart for details).     This a 57 year old male who presents with concerns for high blood pressure.  Recent diagnosis of AAA and aggressive blood pressure control on the hospital.  Reports compliance with medications.  Did develop some back pain this morning.  Was notably hypertensive at home.  Blood pressures here 140s to 150s over 100s.  He was more well controlled at discharge.  He was given his morning medications.  He is overall nontoxic-appearing and vital signs are otherwise reassuring.  4 extremity blood pressures are reassuring and he is nonfocal on exam.  However, given back pain, AAA rupture is a consideration.  Lab work obtained and reassuring.  CT scan ordered and shows no evidence of acute rupture.  Patient has remained clinically stable with downtrending blood pressures.  Recommend close follow-up with CP and vascular surgery as previously scheduled.  After history, exam, and medical workup I feel the patient has been appropriately medically screened and is safe for discharge home. Pertinent diagnoses were discussed with the patient. Patient was given return precautions.   Final Clinical Impressions(s) / ED Diagnoses   Final diagnoses:  Essential hypertension  Acute midline low back pain without sciatica  Abdominal aortic  aneurysm (AAA) without rupture [facility])    ED Discharge Orders    None       Merryl Hacker, MD 01/02/18 [phone]

## 2018-01-02 NOTE — Discharge Instructions (Addendum)
You were seen today for high blood pressure.  Continue taking blood pressure medications as prescribed.  Follow-up with your primary doctor for recheck and adjustments.  If you develop acute back, chest, or abdominal pain, you need to be reevaluated immediately.  Given your AAA, this could be signs of rupture.  Follow-up with vascular surgery as planned otherwise.

## 2018-01-02 NOTE — ED Triage Notes (Signed)
Pt reports HTN this am when he woke up. Recent admission for aneurysm. Hypertensive in triage, reports he has been taking his BP meds as prescribed. Denies chest pain/shortness of breath, endorses intermittent back pain.

## 2018-01-02 NOTE — ED Notes (Signed)
Declined W/C at D/C and was escorted to lobby by RN. 

## 2018-01-04 ENCOUNTER — Other Ambulatory Visit: Payer: Self-pay

## 2018-01-04 ENCOUNTER — Encounter: Payer: Self-pay | Admitting: Family Medicine

## 2018-01-04 ENCOUNTER — Ambulatory Visit: Payer: BLUE CROSS/BLUE SHIELD | Admitting: Family Medicine

## 2018-01-04 VITALS — BP 134/82 | HR 91 | Temp 98.2°F | Ht 69.0 in | Wt 179.9 lb

## 2018-01-04 DIAGNOSIS — I714 Abdominal aortic aneurysm, without rupture, unspecified: Secondary | ICD-10-CM

## 2018-01-04 DIAGNOSIS — N529 Male erectile dysfunction, unspecified: Secondary | ICD-10-CM

## 2018-01-04 DIAGNOSIS — F5102 Adjustment insomnia: Secondary | ICD-10-CM | POA: Diagnosis not present

## 2018-01-04 DIAGNOSIS — I10 Essential (primary) hypertension: Secondary | ICD-10-CM

## 2018-01-04 DIAGNOSIS — E785 Hyperlipidemia, unspecified: Secondary | ICD-10-CM

## 2018-01-04 LAB — BASIC METABOLIC PANEL
BUN: 11 mg/dL (ref 6–23)
CO2: 28 meq/L (ref 19–32)
Calcium: 9.5 mg/dL (ref 8.4–10.5)
Chloride: 102 mEq/L (ref 96–112)
Creatinine, Ser: 0.92 mg/dL (ref 0.40–1.50)
GFR: 108.14 mL/min (ref >60.00)
Glucose, Bld: 94 mg/dL (ref 70–99)
Potassium: 5.3 mEq/L — ABNORMAL HIGH (ref 3.5–5.1)
Sodium: 137 mEq/L (ref 135–145)

## 2018-01-04 MED ORDER — TADALAFIL 20 MG PO TABS: 10.0000 mg | ORAL_TABLET | ORAL | 11 refills | Status: DC | PRN

## 2018-01-04 MED ORDER — ZOLPIDEM TARTRATE 10 MG PO TABS: 10.0000 mg | ORAL_TABLET | Freq: Every evening | ORAL | 0 refills | Status: DC | PRN

## 2018-01-04 NOTE — Progress Notes (Signed)
Subjective:     Patient ID: Benjamin Hines, male   DOB: November 14, 1960, 57 y.o.   MRN: [id number]  HPI Patient has history of hypertension, poor compliance, nicotine use, alcohol abuse who is seen for hospital follow-up.  He was admitted on 12/27/2017 and discharged on the 20th.  He was admitted with abdominal pain of a week duration.  CT abdomen and pelvis in ER showed no acute findings but he did have 4 x 4.2 cm aneurysmal dilatation of the aorta with also comment of extensive mural thickening and suggestion of possible large vessel vasculitis.  His blood pressure was very high.  He was admitted for further evaluation and treatment.  He had blood pressure on admission 207/133.  He had been poorly compliant with medications we had prescribed previously.  He was started back on amlodipine and also started on clonidine 0.1 mg 3 times daily.  He had acute kidney injury and HCTZ held and lisinopril discontinued.  He is taking amlodipine and clonidine regularly at this time and feels much better overall.  Was seen in consultation by vascular surgery.  He had CT angiogram and recommendation was for 4-week follow-up with CV[facility] and repeat CT scan been.  Patient had sed rate of 10 with elevated CRP of 11.8.  He has not had any recent arthralgias.  No unexplained fevers.  No skin rashes.  Mild acute kidney injury.  Creatinine 0.9 on baseline and increased to 1.29.  Patient quit smoking about a year ago.  He has hyperlipidemia and remains on statin and this was also restarted during the hospitalization.  He states he is taking his medications regularly now.  No abdominal pain.  No chest pains.  He was consuming alcohol fairly heavily prior to admission.  He was drinking sometimes up to 6-8 beers per day.  He has been totally abstinent from alcohol since then.  He complains of erectile dysfunction.  He has taken Cialis in the past and requesting refill.  No nitroglycerin use.  He also complains of insomnia issues.   Getting very little sleep.  He has not tried over-the-counter medications.  Past Medical History:  Diagnosis Date  . AAA (abdominal aortic aneurysm) without rupture ([place]) 12/27/2017  . Hyperlipidemia   . Hypertension    Past Surgical History:  Procedure Laterality Date  . FOOT SURGERY     left foot    reports that he has quit smoking. He smoked 1.00 pack per day. He has never used smokeless tobacco. He reports current alcohol use. He reports that he does not use drugs. family history includes CAD in his father; Dementia in his mother; Hypertension in his mother. No Known Allergies   Review of Systems  Constitutional: Negative for chills, fatigue, fever and unexpected weight change.  Eyes: Negative for visual disturbance.  Respiratory: Negative for cough, chest tightness and shortness of breath.   Cardiovascular: Negative for chest pain, palpitations and leg swelling.  Gastrointestinal: Negative for abdominal pain.  Neurological: Negative for dizziness, syncope, weakness, light-headedness and headaches.  Psychiatric/Behavioral: Positive for sleep disturbance. Negative for dysphoric mood.       Objective:   Physical Exam Constitutional:      Appearance: He is well-developed.  HENT:     Right Ear: External ear normal.     Left Ear: External ear normal.  Eyes:     Pupils: Pupils are equal, round, and reactive to light.  Neck:     Musculoskeletal: Neck supple.     Thyroid: No  thyromegaly.  Cardiovascular:     Rate and Rhythm: Normal rate and regular rhythm.  Pulmonary:     Effort: Pulmonary effort is normal. No respiratory distress.     Breath sounds: Normal breath sounds. No wheezing or rales.  Neurological:     Mental Status: He is alert and oriented to person, place, and time.        Assessment:     #1 history of severe hypertension and long history of poor compliance with medications.  Currently improved back on medication regimen above.  Do not believe that  clonidine 3 times daily is a good regimen for this patient who has history of poor compliance already.  Also, increased risk of rebound hypertension with clonidine use if he abruptly discontinues  #2 abdominal aortic aneurysm noted on recent CT.  This was suprarenal and without rupture  #3 transient insomnia  #4 erectile dysfunction  #5 past history of nicotine use    Plan:     -Recheck basic metabolic panel -He is advised to continue to abstain from alcohol -Continue current medications for now.  If renal function back to baseline I would consider possibly starting angiotensin receptor blocker and tapering slowly off clonidine.  Would continue the amlodipine. -He is encouraged to continue close follow-up with CVTS and he has follow-up appointment there in January -Discussed sleep hygiene in some detail.  Recommend first try over-the-counter melatonin.  We wrote for limited Ambien 10 mg to take 1 nightly if severe insomnia but avoid regular use -Refill Cialis 20 mg 1 every other day as needed -schedule follow up in 1-2 months with me.  Eulas Post MD [facility]

## 2018-01-04 NOTE — Patient Instructions (Signed)
Stay of the alcohol  We will call with the labs and will likely be making some blood pressure medication changes,

## 2018-01-11 ENCOUNTER — Other Ambulatory Visit: Payer: Self-pay

## 2018-01-11 MED ORDER — VALSARTAN 80 MG PO TABS: 80.0000 mg | ORAL_TABLET | Freq: Every day | ORAL | 1 refills | Status: DC

## 2018-01-21 ENCOUNTER — Ambulatory Visit
Admit: 2018-01-21 | Discharge: 2018-01-21 | Disposition: A | Discharge status: Home / Self Care | Payer: BLUE CROSS/BLUE SHIELD | Attending: Vascular Surgery | Admitting: Vascular Surgery

## 2018-01-21 ENCOUNTER — Encounter: Payer: Self-pay | Admitting: Radiology

## 2018-01-21 DIAGNOSIS — I713 Abdominal aortic aneurysm, ruptured, unspecified: Secondary | ICD-10-CM

## 2018-01-21 MED ORDER — IOPAMIDOL (ISOVUE-370) INJECTION 76%
75.0000 mL | Freq: Once | INTRAVENOUS | Status: AC | PRN
  Administered 2018-01-21: 75 mL via INTRAVENOUS

## 2018-01-25 ENCOUNTER — Other Ambulatory Visit: Payer: Self-pay

## 2018-01-25 ENCOUNTER — Encounter: Payer: Self-pay | Admitting: Vascular Surgery

## 2018-01-25 ENCOUNTER — Ambulatory Visit (INDEPENDENT_AMBULATORY_CARE_PROVIDER_SITE_OTHER): Payer: BLUE CROSS/BLUE SHIELD | Admitting: Vascular Surgery

## 2018-01-25 VITALS — BP 149/108 | HR 88 | Resp 18 | Ht 69.0 in | Wt 179.0 lb

## 2018-01-25 DIAGNOSIS — I714 Abdominal aortic aneurysm, without rupture, unspecified: Secondary | ICD-10-CM

## 2018-01-25 NOTE — Progress Notes (Signed)
Patient name: Benjamin Hines MRN: [id number] DOB: 1960-03-19 Sex: male  REASON FOR VISIT: Follow-up  HPI: Benjamin Hines is a 58 y.o. male who presents for one-month follow-up after previously being evaluated in the hospital for stranding in his supra visceral aorta.  At the time he presented with abdominal pain and a CT was obtained in the ED that showed some stranding around the aorta.  Ultimately underwent a negative infectious work-up including negative blood cultures.  He states today that he has had no fevers at home.  Hes had no other signs or symptoms of an infection.  He did get a repeat CT scan at 1 month.  Past Medical History:  Diagnosis Date  . AAA (abdominal aortic aneurysm) without rupture ([place]) 12/27/2017  . Hyperlipidemia   . Hypertension     Past Surgical History:  Procedure Laterality Date  . FOOT SURGERY     left foot    Family History  Problem Relation Age of Onset  . Dementia Mother   . Hypertension Mother   . CAD Father     SOCIAL HISTORY: Social History   Tobacco Use  . Smoking status: Former Smoker    Packs/day: 1.00  . Smokeless tobacco: Never Used  Substance Use Topics  . Alcohol use: Yes    Alcohol/week: 0.0 standard drinks    Comment: 4-5 beers per night    No Known Allergies  Current Outpatient Medications  Medication Sig Dispense Refill  . amLODipine (NORVASC) 10 MG tablet Take 1 tablet (10 mg total) by mouth daily. 30 tablet 3  . atorvastatin (LIPITOR) 20 MG tablet Take 1 tablet (20 mg total) by mouth daily. 30 tablet 0  . cloNIDine (CATAPRES) 0.1 MG tablet Take 1 tablet (0.1 mg total) by mouth 3 (three) times daily. 60 tablet 2  . tadalafil (ADCIRCA/CIALIS) 20 MG tablet Take 0.5-1 tablets (10-20 mg total) by mouth every other day as needed for erectile dysfunction. 6 tablet 11  . thiamine 100 MG tablet Take 1 tablet (100 mg total) by mouth daily. 30 tablet 0  . valsartan (DIOVAN) 80 MG tablet Take 1 tablet (80 mg total) by mouth  daily. 30 tablet 1  . zolpidem (AMBIEN) 10 MG tablet Take 1 tablet (10 mg total) by mouth at bedtime as needed for sleep. 15 tablet 0   No current facility-administered medications for this visit.     REVIEW OF SYSTEMS:  [X]  denotes positive finding, [ ]  denotes negative finding Cardiac  Comments:  Chest pain or chest pressure:    Shortness of breath upon exertion:    Short of breath when lying flat:    Irregular heart rhythm:        Vascular    Pain in calf, thigh, or hip brought on by ambulation:    Pain in feet at night that wakes you up from your sleep:     Blood clot in your veins:    Leg swelling:         Pulmonary    Oxygen at home:    Productive cough:     Wheezing:         Neurologic    Sudden weakness in arms or legs:     Sudden numbness in arms or legs:     Sudden onset of difficulty speaking or slurred speech:    Temporary loss of vision in one eye:     Problems with dizziness:         Gastrointestinal  Blood in stool:     Vomited blood:         Genitourinary    Burning when urinating:     Blood in urine:        Psychiatric    Major depression:         Hematologic    Bleeding problems:    Problems with blood clotting too easily:        Skin    Rashes or ulcers:        Constitutional    Fever or chills:      PHYSICAL EXAM: Vitals:   01/25/18 1536 01/25/18 1537  BP: (!) 168/113 (!) 149/108  Pulse: 88   Resp: 18   SpO2: 98%   Weight: 179 lb (81.2 kg)   Height: 5\' 9"  (1.753 m)     GENERAL: The patient is a well-nourished male, in no acute distress. The vital signs are documented above. CARDIAC: There is a regular rate and rhythm.  VASCULAR:  2+ radial pulse palpable bilateral upper extremities 2+ femoral pulse palpable bilateral groins PULMONARY: There is good air exchange bilaterally without wheezing or rales. ABDOMEN: Soft and non-tender.  No rebound or guarding.  MUSCULOSKELETAL: There are no major deformities or  cyanosis. NEUROLOGIC: No focal weakness or paresthesias are detected.   DATA:   CTA abdomen pelvis from 01/21/2018 reviewed by me with no overt inflammation around the aorta and the superficial aorta now measures maximal diameter of 3.9 cm   Assessment/Plan:  Discussed with Mr. Godman that encouraged by his repeat CT scan that shows resolution of inflammation around his aorta.  I was initially concerned with some etiology for aortitis including possible infection.  Ultimately his infectious work-up was negative and repeat scan shows improvement and aorta and now measures 3.9 cm in the supra visceral segment.  He has really no significant pain at this time.  Encouraged by how he is doing overall and recommended to follow-up with me again in 1 year with an ultrasound study.   [name], MD [facility] Office: [phone] Pager: [phone]

## 2018-01-31 ENCOUNTER — Telehealth: Payer: Self-pay | Admitting: Family Medicine

## 2018-01-31 NOTE — Telephone Encounter (Signed)
I see the medications listed as being current and prescribed by you.  I do not see the Thiamine 100 mg since this is new from hospital. OK to fill for patient?

## 2018-01-31 NOTE — Telephone Encounter (Signed)
Copied from [place] [id number]. Topic: Quick Communication - Rx Refill/Question >> Jan 31, 2018  9:16 AM Loma [place] wrote: Medication: atorvastatin (LIPITOR) 20 MG tablet valsartan (DIOVAN) 80 MG /tablet thiamine 100 MG tablet/ amLODipine (NORVASC) 10 MG tablet All four of these scripts per the pharmacy say needs dr to call in/ according to chart and pt bottle not Amlodipine. Only has 1 pill left/ last dr visit 12/24 needs refill per pharmacy on all 4 wihich includes amlodipine still using [facility] [phone]

## 2018-01-31 NOTE — Telephone Encounter (Signed)
Yes.  OK to refill the Thiamine as well.

## 2018-02-01 ENCOUNTER — Other Ambulatory Visit: Payer: Self-pay

## 2018-02-01 MED ORDER — ATORVASTATIN CALCIUM 20 MG PO TABS: 20.0000 mg | ORAL_TABLET | Freq: Every day | ORAL | 3 refills | Status: DC

## 2018-02-01 MED ORDER — AMLODIPINE BESYLATE 10 MG PO TABS: 10.0000 mg | ORAL_TABLET | Freq: Every day | ORAL | 3 refills | Status: DC

## 2018-02-01 MED ORDER — THIAMINE HCL 100 MG PO TABS: 100.0000 mg | ORAL_TABLET | Freq: Every day | ORAL | 3 refills | Status: DC

## 2018-02-01 MED ORDER — VALSARTAN 80 MG PO TABS: 80.0000 mg | ORAL_TABLET | Freq: Every day | ORAL | 3 refills | Status: DC

## 2018-02-01 NOTE — Telephone Encounter (Signed)
Called patient and let him know that I have sent all 4 of the prescriptions to the [facility] for him. Patient verbalized an understanding.

## 2018-02-08 ENCOUNTER — Ambulatory Visit: Payer: BLUE CROSS/BLUE SHIELD | Admitting: Family Medicine

## 2018-02-18 ENCOUNTER — Ambulatory Visit: Payer: BLUE CROSS/BLUE SHIELD | Admitting: Family Medicine

## 2018-02-18 ENCOUNTER — Encounter: Payer: Self-pay | Admitting: Family Medicine

## 2018-02-18 ENCOUNTER — Other Ambulatory Visit: Payer: Self-pay

## 2018-02-18 VITALS — BP 140/102 | HR 89 | Temp 98.3°F | Ht 69.0 in | Wt 177.1 lb

## 2018-02-18 DIAGNOSIS — I714 Abdominal aortic aneurysm, without rupture, unspecified: Secondary | ICD-10-CM

## 2018-02-18 DIAGNOSIS — E785 Hyperlipidemia, unspecified: Secondary | ICD-10-CM | POA: Diagnosis not present

## 2018-02-18 DIAGNOSIS — I10 Essential (primary) hypertension: Secondary | ICD-10-CM | POA: Diagnosis not present

## 2018-02-18 LAB — BASIC METABOLIC PANEL
BUN: 13 mg/dL (ref 6–23)
CO2: 28 mEq/L (ref 19–32)
Calcium: 9.3 mg/dL (ref 8.4–10.5)
Chloride: 105 mEq/L (ref 96–112)
Creatinine, Ser: 0.86 mg/dL (ref 0.40–1.50)
GFR: 109.93 mL/min (ref >60.00)
Glucose, Bld: 86 mg/dL (ref 70–99)
POTASSIUM: 4.2 meq/L (ref 3.5–5.1)
Sodium: 142 mEq/L (ref 135–145)

## 2018-02-18 LAB — LIPID PANEL
Cholesterol: 149 mg/dL (ref 0–200)
HDL: 45 mg/dL (ref >39.00)
LDL Cholesterol: 82 mg/dL (ref 0–99)
NonHDL: 104.27
Total CHOL/HDL Ratio: 3
Triglycerides: 110 mg/dL (ref 0.0–149.0)
VLDL: 22 mg/dL (ref 0.0–40.0)

## 2018-02-18 LAB — HEPATIC FUNCTION PANEL
ALT: 21 U/L (ref 0–53)
AST: 18 U/L (ref 0–37)
Albumin: 4.4 g/dL (ref 3.5–5.2)
Alkaline Phosphatase: 95 U/L (ref 39–117)
BILIRUBIN TOTAL: 0.4 mg/dL (ref 0.2–1.2)
Bilirubin, Direct: 0.1 mg/dL (ref 0.0–0.3)
Total Protein: 7.1 g/dL (ref 6.0–8.3)

## 2018-02-18 MED ORDER — VALSARTAN 160 MG PO TABS: 160.0000 mg | ORAL_TABLET | Freq: Every day | ORAL | 3 refills | Status: DC

## 2018-02-18 NOTE — Progress Notes (Signed)
  Subjective:     Patient ID: Benjamin Hines, male   DOB: [birth year], 58 y.o.   MRN: [id number]  HPI Patient is seen for medical follow-up.  Refer to recent note.  He had been admitted to hospital with severe elevated blood pressure.  He had been discharged on clonidine but we had concerns because he was feeling somewhat poorly on clonidine also very high risk of rebound hypertension with his history of poor compliance.  We maintained amlodipine 10 mg daily and started valsartan  80 mg daily and had him taper off the clonidine.  He feels much better overall.  No headaches.  No dizziness.  No chest pains.  He has greatly reduced alcohol though not totally eliminated.  No longer drinking beer.  Drinks about 1 glass of wine per day.  He has tried to watch his sodium intake.  He has hyperlipidemia and takes atorvastatin for that.  Is due for follow-up lipids.  He has abdominal aortic aneurysm and had recent follow-up with vein and vascular surgery.  Will be getting yearly follow-up for that.  Past Medical History:  Diagnosis Date  . AAA (abdominal aortic aneurysm) without rupture ([place]) 12/27/2017  . Hyperlipidemia   . Hypertension    Past Surgical History:  Procedure Laterality Date  . FOOT SURGERY     left foot    reports that he has quit smoking. He smoked 1.00 pack per day. He has never used smokeless tobacco. He reports current alcohol use. He reports that he does not use drugs. family history includes CAD in his father; Dementia in his mother; Hypertension in his mother. No Known Allergies   Review of Systems  Constitutional: Negative for fatigue.  Eyes: Negative for visual disturbance.  Respiratory: Negative for cough, chest tightness and shortness of breath.   Cardiovascular: Negative for chest pain, palpitations and leg swelling.  Gastrointestinal: Negative for abdominal pain.  Endocrine: Negative for polydipsia and polyuria.  Neurological: Negative for dizziness, syncope,  weakness, light-headedness and headaches.       Objective:   Physical Exam Constitutional:      Appearance: He is well-developed.  HENT:     Right Ear: External ear normal.     Left Ear: External ear normal.  Eyes:     Pupils: Pupils are equal, round, and reactive to light.  Neck:     Musculoskeletal: Neck supple.     Thyroid: No thyromegaly.  Cardiovascular:     Rate and Rhythm: Normal rate and regular rhythm.  Pulmonary:     Effort: Pulmonary effort is normal. No respiratory distress.     Breath sounds: Normal breath sounds. No wheezing or rales.  Neurological:     Mental Status: He is alert and oriented to person, place, and time.        Assessment:     #1 hypertension improved but not to goal  #2 hyperlipidemia  #3 abdominal aortic aneurysm    Plan:     -Increase valsartan to 160 mg daily and continue amlodipine 10 mg daily -Again discussed the importance of moderation in alcohol intake and also sodium reduction -Check labs with repeat basic metabolic panel, lipid panel, hepatic panel -Continue yearly follow-up with vein and vascular surgery regarding his aneurysm -Routine follow-up here in 3 months to reassess blood pressure.  We have again stressed importance of regular compliance with medications  Eulas Post MD [facility] at [facility]

## 2018-02-18 NOTE — Patient Instructions (Signed)
Go ahead  And increase the Valsartan to 160 mg once daily  Continue with Amlodipine  Lets plan on 3 month follow up.Marland Kitchen

## 2018-03-01 ENCOUNTER — Telehealth: Payer: Self-pay | Admitting: Family Medicine

## 2018-03-01 NOTE — Telephone Encounter (Signed)
Copied from [place] [id number]. Topic: Quick Communication - Rx Refill/Question >> Mar 01, 2018  8:41 AM Yvette Rack wrote: Medication: tadalafil (ADCIRCA/CIALIS) 20 MG tablet  Has the patient contacted their pharmacy? Yes.   (Agent: If no, request that the patient contact the pharmacy for the refill.) (Agent: If yes, when and what did the pharmacy advise?) to call provider  Preferred Pharmacy (with phone number or street name): [place], [place] - [address] [phone] (Phone) [phone] (Fax)    Agent: Please be advised that RX refills may take up to 3 business days. We ask that you follow-up with your pharmacy.

## 2018-03-01 NOTE — Telephone Encounter (Signed)
Call pt. To discuss refill request for Tadalafil; last refill 01/04/18; #6; RF x 11.  The pt. stated he called the pharmacy and put in his Rx #, and was advised to call his doctor for further refills.  Attempted to call pharmacy; pharmacy doesn't open until 9:00 AM.

## 2018-03-01 NOTE — Telephone Encounter (Signed)
[place]; was advised the pt. does have refills on Tadalafil.  Called pt. and advised he has refills avail. On Tadalfil; encouraged to make sure he is giving the current Rx number from bottle he has just finished, in future requests.  Verb. understanding.

## 2018-03-07 ENCOUNTER — Ambulatory Visit: Payer: Self-pay | Admitting: *Deleted

## 2018-03-07 ENCOUNTER — Telehealth: Payer: Self-pay | Admitting: Family Medicine

## 2018-03-07 NOTE — Telephone Encounter (Signed)
Copied from [place] [id number]. Topic: Quick Communication - Rx Refill/Question >> Mar 07, 2018 10:27 AM Burchel, Abbi R wrote: Medication: tadalafil (ADCIRCA/CIALIS) 20 MG tablet  Pt states when he refilled this rx this time, the pills looked different, and he thinks it may have been a generic or a different medication.  Pt states this medication caused a headache.  Please call pt to discuss.  [phone]      Pt calling with complaints of a headache that last approximately a day after taking Tadalafil 20 mg tab.Pt also had concerns of the pill looking different from his last rx. Explained to pt that if the pill was by a different manufacturer or brand the pill could look different.Understnding verbalized. Pt states she he did call the pharmacy and was advised to call the PCP because he was now receiving the generic form of Cialis.  Pt states he did not experience the headache when taking the brand name of this medication but states he was unable to afford the prescription. Pt states it would cost him $200 for 4 pills.  Pt asking for recommendations or if another medication could be prescribed that does not cause him to have a headache. Pt can be contacted at [phone].  Reason for Disposition . Caller has URGENT medication question about med that PCP prescribed and triager unable to answer question  Protocols used: MEDICATION QUESTION CALL-A-AH

## 2018-03-07 NOTE — Telephone Encounter (Signed)
Copied from [place] [id number]. Topic: Quick Communication - Rx Refill/Question >> Mar 07, 2018 10:27 AM Burchel, Abbi R wrote: Medication: tadalafil (ADCIRCA/CIALIS) 20 MG tablet  Pt states when he refilled this rx this time, the pills looked different, and he thinks it may have been a generic or a different medication.  Pt states this medication caused a headache.  Please call pt to discuss.  [phone]  Preferred Pharmacy: [place], [place] - [address] [place] [address] Phone: [phone] Fax: [phone]    Pt was advised that RX refills may take up to 3 business days. We ask that you follow-up with your pharmacy.

## 2018-03-07 NOTE — Telephone Encounter (Signed)
Called patient and left a detailed voice message per Dr. Elease Hashimoto: Unfortunately, IF headache related to the Cialis, shorter acting drug such as Viagra could cause same side effect- or worse  OK for PEC to discuss/advise message to patient.  CRM Created.

## 2018-03-07 NOTE — Telephone Encounter (Signed)
Please see message. °

## 2018-03-07 NOTE — Telephone Encounter (Signed)
Unfortunately, IF headache related to the Cialis, shorter acting drug such as Viagra could cause same side effect- or worse.

## 2018-03-07 NOTE — Telephone Encounter (Signed)
See triage encounter from 03/07/18.

## 2018-03-08 ENCOUNTER — Ambulatory Visit: Payer: BLUE CROSS/BLUE SHIELD | Admitting: Family Medicine

## 2018-04-01 ENCOUNTER — Encounter: Payer: Self-pay | Admitting: Gastroenterology

## 2018-05-19 ENCOUNTER — Telehealth: Payer: Self-pay | Admitting: Family Medicine

## 2018-05-19 NOTE — Telephone Encounter (Signed)
Copied from [place] [id number]. Topic: General - Other >> May 19, 2018  1:19 PM Keene Breath wrote: Reason for CRM: Patient called to reschedule his appt.  Tried the office 2x but the line was busy.  Patient would like a call back to reschedule.  CB# [phone]

## 2018-05-20 ENCOUNTER — Ambulatory Visit (INDEPENDENT_AMBULATORY_CARE_PROVIDER_SITE_OTHER): Payer: BLUE CROSS/BLUE SHIELD | Admitting: Family Medicine

## 2018-05-20 ENCOUNTER — Ambulatory Visit: Payer: BLUE CROSS/BLUE SHIELD | Admitting: Family Medicine

## 2018-05-20 ENCOUNTER — Other Ambulatory Visit: Payer: Self-pay

## 2018-05-20 DIAGNOSIS — I10 Essential (primary) hypertension: Secondary | ICD-10-CM

## 2018-05-20 MED ORDER — HYDROCHLOROTHIAZIDE 12.5 MG PO CAPS: 12.5000 mg | ORAL_CAPSULE | Freq: Every day | ORAL | 3 refills | Status: DC

## 2018-05-20 NOTE — Progress Notes (Signed)
Patient ID: Benjamin Hines, male   DOB: [birth year], 58 y.o.   MRN: [id number]  This visit type was conducted due to national recommendations for restrictions regarding the COVID-19 pandemic in an effort to limit this patient's exposure and mitigate transmission in our community.    Virtual Visit via Video Note  I connected with Benjamin Hines on 05/20/18 at  8:15 AM EDT by a video enabled telemedicine application and verified that I am speaking with the correct person using two identifiers.  Location patient: home Location provider:work or home office Persons participating in the virtual visit: patient, provider  I discussed the limitations of evaluation and management by telemedicine and the availability of in person appointments. The patient expressed understanding and agreed to proceed.   HPI: Patient is following up for hypertension.  He currently takes amlodipine 10 mg daily and valsartan 160 mg daily.  Several blood pressures up recently as high as 941 systolic and 90 diastolic.  No headaches or dizziness.  No chest pain.  Patient has abdominal aortic aneurysm which will be followed by vascular.  He has hyperlipidemia and did go on Lipitor recently.  He is cholesterol went down from 231 to 149 on follow-up 3 months ago.  He has had some mild peripheral edema which is likely related to amlodipine.  Overall feels well     ROS: See pertinent positives and negatives per HPI.  Past Medical History:  Diagnosis Date  . AAA (abdominal aortic aneurysm) without rupture ([place]) 12/27/2017  . Hyperlipidemia   . Hypertension     Past Surgical History:  Procedure Laterality Date  . FOOT SURGERY     left foot    Family History  Problem Relation Age of Onset  . Dementia Mother   . Hypertension Mother   . CAD Father     SOCIAL HX: Former smoker   Current Outpatient Medications:  .  amLODipine (NORVASC) 10 MG tablet, Take 1 tablet (10 mg total) by mouth daily., Disp: 30 tablet,  Rfl: 3 .  atorvastatin (LIPITOR) 20 MG tablet, Take 1 tablet (20 mg total) by mouth daily., Disp: 30 tablet, Rfl: 3 .  hydrochlorothiazide (MICROZIDE) 12.5 MG capsule, Take 1 capsule (12.5 mg total) by mouth daily., Disp: 90 capsule, Rfl: 3 .  tadalafil (ADCIRCA/CIALIS) 20 MG tablet, Take 0.5-1 tablets (10-20 mg total) by mouth every other day as needed for erectile dysfunction., Disp: 6 tablet, Rfl: 11 .  thiamine 100 MG tablet, Take 1 tablet (100 mg total) by mouth daily., Disp: 30 tablet, Rfl: 3 .  valsartan (DIOVAN) 160 MG tablet, Take 1 tablet (160 mg total) by mouth daily., Disp: 90 tablet, Rfl: 3 .  zolpidem (AMBIEN) 10 MG tablet, Take 1 tablet (10 mg total) by mouth at bedtime as needed for sleep., Disp: 15 tablet, Rfl: 0  EXAM:  VITALS per patient if applicable:  GENERAL: alert, oriented, appears well and in no acute distress  HEENT: atraumatic, conjunttiva clear, no obvious abnormalities on inspection of external nose and ears  NECK: normal movements of the head and neck  LUNGS: on inspection no signs of respiratory distress, breathing rate appears normal, no obvious gross SOB, gasping or wheezing  CV: no obvious cyanosis  MS: moves all visible extremities without noticeable abnormality  PSYCH/NEURO: pleasant and cooperative, no obvious depression or anxiety, speech and thought processing grossly intact  ASSESSMENT AND PLAN:  Discussed the following assessment and plan:  Essential hypertension-poorly controlled by home readings  -Add HCTZ 12.5 mg  daily -Discussed healthy diet issues -3-month office follow-up.  Check basic metabolic panel then     I discussed the assessment and treatment plan with the patient. The patient was provided an opportunity to ask questions and all were answered. The patient agreed with the plan and demonstrated an understanding of the instructions.   The patient was advised to call back or seek an in-person evaluation if the symptoms worsen  or if the condition fails to improve as anticipated.     Carolann Littler, MD

## 2018-08-23 ENCOUNTER — Other Ambulatory Visit: Payer: Self-pay | Admitting: Family Medicine

## 2018-10-24 ENCOUNTER — Ambulatory Visit: Payer: BLUE CROSS/BLUE SHIELD | Admitting: Family Medicine

## 2018-10-28 ENCOUNTER — Ambulatory Visit: Payer: BLUE CROSS/BLUE SHIELD | Admitting: Family Medicine

## 2018-10-31 ENCOUNTER — Ambulatory Visit: Payer: Self-pay

## 2018-10-31 ENCOUNTER — Ambulatory Visit (INDEPENDENT_AMBULATORY_CARE_PROVIDER_SITE_OTHER): Payer: BLUE CROSS/BLUE SHIELD | Admitting: Family Medicine

## 2018-10-31 ENCOUNTER — Other Ambulatory Visit: Payer: Self-pay

## 2018-10-31 ENCOUNTER — Telehealth: Payer: Self-pay | Admitting: Family Medicine

## 2018-10-31 DIAGNOSIS — R1084 Generalized abdominal pain: Secondary | ICD-10-CM

## 2018-10-31 NOTE — Telephone Encounter (Signed)
Pt. Called to report abdominal pain x 3 weeks.  Reported the pain starts in lower abdomen and moves up the below the rib cage and radiates into the mid chest region.  Denied chest pain at present time.  Reported abdominal pain is moderate and is more prominent when he sits or lays down.  C/o abdominal bloating, and increase in frequency of stools.  Denied diarrhea; stated the stools are smaller in size, and formed.  Denied fever/ chills.  Denied nausea, vomiting, sweating, or shortness of breath.   Recommended pt. Make an appt. Today.  Transferred to [employer].  Virtual appt. Scheduled today @ 3:45 PM.    Reason for Disposition . [1] MILD-MODERATE pain AND [2] constant AND [3] present > 2 hours  Answer Assessment - Initial Assessment Questions 1. LOCATION: "Where does it hurt?"      Abdominal region from lower to bottom of rib cage 2. RADIATION: "Does the pain shoot anywhere else?" (e.g., chest, back)     Up to mid chest 3. ONSET: "When did the pain begin?" (Minutes, hours or days ago)      3 weeks ago 4. SUDDEN: "Gradual or sudden onset?"     gradual 5. PATTERN "Does the pain come and go, or is it constant?"    - If constant: "Is it getting better, staying the same, or worsening?"      (Note: Constant means the pain never goes away completely; most serious pain is constant and it progresses)     - If intermittent: "How long does it last?" "Do you have pain now?"     (Note: Intermittent means the pain goes away completely between bouts)     Constant  6. SEVERITY: "How bad is the pain?"  (e.g., Scale 1-10; mild, moderate, or severe)    - MILD (1-3): doesn't interfere with normal activities, abdomen soft and not tender to touch     - MODERATE (4-7): interferes with normal activities or awakens from sleep, tender to touch     - SEVERE (8-10): excruciating pain, doubled over, unable to do any normal activities      moderate 7. RECURRENT SYMPTOM: "Have you ever had this type of abdominal pain  before?" If so, ask: "When was the last time?" and "What happened that time?"      No  8. CAUSE: "What do you think is causing the abdominal pain?"     unknown 9. RELIEVING/AGGRAVATING FACTORS: "What makes it better or worse?" (e.g., movement, antacids, bowel movement)     Sitting down causes the discomfort to be more prominent 10. OTHER SYMPTOMS: "Has there been any vomiting, diarrhea, constipation, or urine problems?"       More freq bm's; smaller in size; abdominal bloating, no nausea or vomiting, denied fever/ chills  Protocols used: ABDOMINAL PAIN - MALE-A-AH

## 2018-10-31 NOTE — Telephone Encounter (Signed)
Dr. Elease Hashimoto had a doxy visit with pt but feels like pt needs to be seen in office. He states pt can come in either tomorrow or Wednesday.   Attempted to reach pt, no answer. Left vm informing pt to contact us to schedule appt

## 2018-10-31 NOTE — Progress Notes (Signed)
Patient ID: Benjamin Hines, male   DOB: [birth year], 58 y.o.   MRN: [id number]  This visit type was conducted due to national recommendations for restrictions regarding the COVID-19 pandemic in an effort to limit this patient's exposure and mitigate transmission in our community.   Virtual Visit via Telephone Note  I connected with Benjamin Hines on 10/31/18 at  3:45 PM EDT by telephone and verified that I am speaking with the correct person using two identifiers.   I discussed the limitations, risks, security and privacy concerns of performing an evaluation and management service by telephone and the availability of in person appointments. I also discussed with the patient that there may be a patient responsible charge related to this service. The patient expressed understanding and agreed to proceed.  Location patient: home Location provider: work or home office Participants present for the call: patient, provider Patient did not have a visit in the prior 7 days to address this/these issue(s).   History of Present Illness: Patient relates few weeks of fairly diffuse abdominal pain.  He states he had increased gas symptoms intermittently.  Symptoms are very diffuse and bilateral.  He states that his pain "moves around".  He has had some decreased appetite but denies any weight loss.  No fever.  No nausea or vomiting.  No bloody stools.  He is having perhaps more frequent stools.  No urinary difficulties.  Denies any swallowing difficulties.  No alleviating or exacerbating factors.  Last colonoscopy was 10 years ago.   Observations/Objective: Patient sounds cheerful and well on the phone. I do not appreciate any SOB. Speech and thought processing are grossly intact. Patient reported vitals:  Assessment and Plan:  Patient calls with several week history of diffuse intermittent abdominal pain.  No red flags such as fever or weight loss.  Needs further evaluation  -Recommend office follow-up  to further assess with labs and possible x-rays if indicated -pt also needs repeat colonoscopy- as above.    Follow Up Instructions:  -instructed my nurse to get worked in tomorrow or Wednesday to further assess.   [id number] 5-10 [id number] 11-20 [id number] 21-30 I did not refer this patient for an OV in the next 24 hours for this/these issue(s).  I discussed the assessment and treatment plan with the patient. The patient was provided an opportunity to ask questions and all were answered. The patient agreed with the plan and demonstrated an understanding of the instructions.   The patient was advised to call back or seek an in-person evaluation if the symptoms worsen or if the condition fails to improve as anticipated.  I provided 16 minutes of non-face-to-face time during this encounter.   Carolann Littler, MD

## 2018-11-02 ENCOUNTER — Other Ambulatory Visit: Payer: Self-pay | Admitting: Family Medicine

## 2018-11-04 ENCOUNTER — Encounter: Payer: Self-pay | Admitting: Family Medicine

## 2018-11-04 ENCOUNTER — Ambulatory Visit (INDEPENDENT_AMBULATORY_CARE_PROVIDER_SITE_OTHER): Payer: BLUE CROSS/BLUE SHIELD | Admitting: Family Medicine

## 2018-11-04 ENCOUNTER — Other Ambulatory Visit: Payer: Self-pay

## 2018-11-04 VITALS — BP 190/110 | HR 82 | Temp 98.0°F | Ht 69.0 in | Wt 189.9 lb

## 2018-11-04 DIAGNOSIS — I10 Essential (primary) hypertension: Secondary | ICD-10-CM | POA: Diagnosis not present

## 2018-11-04 DIAGNOSIS — R1084 Generalized abdominal pain: Secondary | ICD-10-CM | POA: Diagnosis not present

## 2018-11-04 DIAGNOSIS — R0683 Snoring: Secondary | ICD-10-CM

## 2018-11-04 DIAGNOSIS — R400 Somnolence: Secondary | ICD-10-CM | POA: Diagnosis not present

## 2018-11-04 LAB — CBC WITH DIFFERENTIAL/PLATELET
Basophils Absolute: 0 10*3/uL (ref 0.0–0.1)
Basophils Relative: 0.3 % (ref 0.0–3.0)
Eosinophils Absolute: 0.1 10*3/uL (ref 0.0–0.7)
Eosinophils Relative: 1.1 % (ref 0.0–5.0)
HCT: 46.9 % (ref 39.0–52.0)
Hemoglobin: 15.2 g/dL (ref 13.0–17.0)
Lymphocytes Relative: 36.6 % (ref 12.0–46.0)
Lymphs Abs: 3.1 10*3/uL (ref 0.7–4.0)
MCHC: 32.4 g/dL (ref 30.0–36.0)
MCV: 90.7 fl (ref 78.0–100.0)
Monocytes Absolute: 0.5 10*3/uL (ref 0.1–1.0)
Monocytes Relative: 6.2 % (ref 3.0–12.0)
Neutro Abs: 4.7 10*3/uL (ref 1.4–7.7)
Neutrophils Relative %: 55.8 % (ref 43.0–77.0)
Platelets: 245 10*3/uL (ref 150.0–400.0)
RBC: 5.17 Mil/uL (ref 4.22–5.81)
RDW: 14.5 % (ref 11.5–15.5)
WBC: 8.4 10*3/uL (ref 4.0–10.5)

## 2018-11-04 LAB — COMPREHENSIVE METABOLIC PANEL
ALT: 25 U/L (ref 0–53)
AST: 23 U/L (ref 0–37)
Albumin: 4.4 g/dL (ref 3.5–5.2)
Alkaline Phosphatase: 81 U/L (ref 39–117)
BUN: 11 mg/dL (ref 6–23)
CO2: 27 mEq/L (ref 19–32)
Calcium: 9.4 mg/dL (ref 8.4–10.5)
Chloride: 102 mEq/L (ref 96–112)
Creatinine, Ser: 0.93 mg/dL (ref 0.40–1.50)
GFR: 100.2 mL/min (ref >60.00)
Glucose, Bld: 89 mg/dL (ref 70–99)
Potassium: 4.3 mEq/L (ref 3.5–5.1)
Sodium: 139 mEq/L (ref 135–145)
Total Bilirubin: 0.6 mg/dL (ref 0.2–1.2)
Total Protein: 7.2 g/dL (ref 6.0–8.3)

## 2018-11-04 MED ORDER — HYDRALAZINE HCL 25 MG PO TABS: 25.0000 mg | ORAL_TABLET | Freq: Three times a day (TID) | ORAL | 5 refills | Status: DC

## 2018-11-04 NOTE — Progress Notes (Signed)
Subjective:     Patient ID: Benjamin Hines, male   DOB: 25-Oct-1960, 58 y.o.   MRN: [id number]  HPI Mr. Cumberbatch is seen for follow-up regarding abdominal pain for the past few weeks.  Refer to virtual visit for further details.  His pain has been very diffuse and bilateral.  Has had increased gas symptoms.  He admits any other day decreased appetite but actually today states his appetite is fairly stable.  He is actually had some weight gain with about 12 pound weight gain since last visit here last February.  He denies any dysphagia.  No stool changes.  No dysuria.  Last colonoscopy about 10 years ago.  No recent nausea or vomiting.  He has hypertension which is been severe and challenging to control.  Past history of poor compliance.  He states he is taking his amlodipine, HCTZ, and valsartan regularly.  Also takes atorvastatin for hyperlipidemia.  No recent headaches.  No focal weakness.  Patient states his fiance is concerned because he has been snoring a lot recently and she has observed some apnea type episodes.  He has frequent daytime somnolence.  Never studied previously.  Past Medical History:  Diagnosis Date  . AAA (abdominal aortic aneurysm) without rupture ([place]) 12/27/2017  . Hyperlipidemia   . Hypertension    Past Surgical History:  Procedure Laterality Date  . FOOT SURGERY     left foot    reports that he has quit smoking. He smoked 1.00 pack per day. He has never used smokeless tobacco. He reports current alcohol use. He reports that he does not use drugs. family history includes CAD in his father; Dementia in his mother; Hypertension in his mother. No Known Allergies   Review of Systems  Constitutional: Positive for fatigue. Negative for appetite change, chills, fever and unexpected weight change.  Respiratory: Negative for cough and shortness of breath.   Cardiovascular: Negative for chest pain and palpitations.  Gastrointestinal: Positive for abdominal pain.  Negative for abdominal distention, blood in stool, constipation, diarrhea, nausea and vomiting.  Genitourinary: Negative for dysuria.  Neurological: Negative for headaches.  Hematological: Negative for adenopathy.       Objective:   Physical Exam Constitutional:      Appearance: He is well-developed.  HENT:     Right Ear: External ear normal.     Left Ear: External ear normal.  Eyes:     Pupils: Pupils are equal, round, and reactive to light.  Neck:     Musculoskeletal: Neck supple.     Thyroid: No thyromegaly.  Cardiovascular:     Rate and Rhythm: Normal rate and regular rhythm.  Pulmonary:     Effort: Pulmonary effort is normal. No respiratory distress.     Breath sounds: Normal breath sounds. No wheezing or rales.  Musculoskeletal:     Right lower leg: No edema.     Left lower leg: No edema.  Neurological:     Mental Status: He is alert and oriented to person, place, and time.        Assessment:     # 1 intermittent abdominal pain.  This sounds like possibly more gaseous.  He has not had any red flags such as weight loss, localizing pain, vomiting, stool changes, etc.  #2 hypertension which is very poorly controlled.  He states he has been compliant with medications  #3 possible obstructive sleep apnea by history    Plan:     -Check CBC and comprehensive metabolic panel -Add  hydralazine 25 mg 3 times daily and recheck blood pressure 2 to 3 weeks -Set up referral to pulmonary for obstructive sleep apnea rule out -Consider renal duplex scan and further evaluation for secondary causes of hypertension if not improving at follow-up -Continue to watch sodium intake and no more than 2 alcoholic beverages per day  Eulas Post MD [facility] at [facility]

## 2018-11-25 ENCOUNTER — Encounter: Payer: Self-pay | Admitting: Family Medicine

## 2018-11-25 ENCOUNTER — Other Ambulatory Visit: Payer: Self-pay

## 2018-11-25 ENCOUNTER — Ambulatory Visit (INDEPENDENT_AMBULATORY_CARE_PROVIDER_SITE_OTHER): Payer: BLUE CROSS/BLUE SHIELD | Admitting: Family Medicine

## 2018-11-25 VITALS — BP 156/110 | HR 83 | Temp 98.0°F | Resp 16 | Ht 69.0 in | Wt 191.0 lb

## 2018-11-25 DIAGNOSIS — I10 Essential (primary) hypertension: Secondary | ICD-10-CM | POA: Diagnosis not present

## 2018-11-25 MED ORDER — HYDRALAZINE HCL 50 MG PO TABS: 50.0000 mg | ORAL_TABLET | Freq: Three times a day (TID) | ORAL | 3 refills | Status: DC

## 2018-11-25 NOTE — Progress Notes (Signed)
  Subjective:     Patient ID: Benjamin Hines, male   DOB: 06/24/1960, 58 y.o.   MRN: [id number]  HPI   [place] is here for follow-up hypertension.  This has been very difficult to manage and control.  He is currently on 4 medications.  We added hydralazine 25 mg 3 times daily.  No side effects.  He states he is compliant with all medications.  He has scaled back his alcohol to about 1 beer per day.  Trying to eat less fried foods and watching sodium intake closely.  He plans to get some walking shoes and start walking more diligently.  No chest pains or dizziness.  Abdominal pain from last visit has fully resolved.  His appetite is better.  Weight is up 2 pounds.  Past Medical History:  Diagnosis Date  . AAA (abdominal aortic aneurysm) without rupture ([place]) 12/27/2017  . Hyperlipidemia   . Hypertension    Past Surgical History:  Procedure Laterality Date  . FOOT SURGERY     left foot    reports that he has quit smoking. He smoked 1.00 pack per day. He has never used smokeless tobacco. He reports current alcohol use. He reports that he does not use drugs. family history includes CAD in his father; Dementia in his mother; Hypertension in his mother. No Known Allergies   Review of Systems  Constitutional: Negative for fatigue.  Eyes: Negative for visual disturbance.  Respiratory: Negative for cough, chest tightness and shortness of breath.   Cardiovascular: Negative for chest pain, palpitations and leg swelling.  Endocrine: Negative for polydipsia and polyuria.  Neurological: Negative for dizziness, syncope, weakness, light-headedness and headaches.       Objective:   Physical Exam Constitutional:      Appearance: He is well-developed.  HENT:     Right Ear: External ear normal.     Left Ear: External ear normal.  Eyes:     Pupils: Pupils are equal, round, and reactive to light.  Neck:     Musculoskeletal: Neck supple.     Thyroid: No thyromegaly.  Cardiovascular:     Rate  and Rhythm: Normal rate and regular rhythm.  Pulmonary:     Effort: Pulmonary effort is normal. No respiratory distress.     Breath sounds: Normal breath sounds. No wheezing or rales.  Neurological:     Mental Status: He is alert and oriented to person, place, and time.        Assessment:     Hypertension poorly controlled but improved    Plan:     -Increase hydralazine to 50 mg 3 times daily.  He will continue with HCTZ, amlodipine, and valsartan..  Plan 1 month follow-up.  Consider renal duplex scan and further evaluation if not further to goal at that point. -Initiate more consistent aerobic exercise such as walking -Reminder to keep alcohol intake low and also sodium intake  Eulas Post MD [facility] at [facility]

## 2018-12-09 ENCOUNTER — Institutional Professional Consult (permissible substitution): Payer: BLUE CROSS/BLUE SHIELD | Admitting: Pulmonary Disease

## 2018-12-15 ENCOUNTER — Encounter: Payer: Self-pay | Admitting: Pulmonary Disease

## 2018-12-15 ENCOUNTER — Other Ambulatory Visit: Payer: Self-pay

## 2018-12-15 ENCOUNTER — Ambulatory Visit (INDEPENDENT_AMBULATORY_CARE_PROVIDER_SITE_OTHER): Payer: BLUE CROSS/BLUE SHIELD | Admitting: Pulmonary Disease

## 2018-12-15 VITALS — BP 144/96 | HR 107 | Temp 97.0°F | Ht 69.0 in | Wt 196.0 lb

## 2018-12-15 DIAGNOSIS — R0683 Snoring: Secondary | ICD-10-CM | POA: Diagnosis not present

## 2018-12-15 NOTE — Patient Instructions (Signed)
Will arrange for home sleep study Will call to arrange for follow up after sleep study reviewed  

## 2018-12-15 NOTE — Progress Notes (Signed)
[facility]  Chief Complaint  Patient presents with  . Consult    Snoring, Daytime Somnolence    Constitutional:  BP (!) 144/96 (BP Location: Right Arm, Patient Position: Sitting, Cuff Size: Normal)   Pulse (!) 107   Temp (!) 97 F (36.1 C)   Ht 5\' 9"  (1.753 m)   Wt 196 lb (88.9 kg)   SpO2 96% Comment: on room air  BMI 28.94 kg/m   Past Medical History:  AAA, CHF, HTN, HLD  Brief Summary:  Benjamin Hines is a 58 y.o. male with snoring.  He is followed by PCP.  He was noted to have increasing blood pressure.  His girlfriend says he snores and will stop breathing at night.  He is a restless sleeper.  He has trouble staying focused when sitting quiet.  He goes to sleep at 11 pm.  He falls asleep in 30 minutes.  He wakes up 2 times to use the bathroom.  He gets out of bed at 7 am.  He feels tired in the morning.  He denies morning headache.  He does not use anything to help him fall sleep or stay awake.  He denies sleep walking, sleep talking, bruxism, or nightmares.  There is no history of restless legs.  He denies sleep hallucinations, sleep paralysis, or cataplexy.  The Epworth score is 6 out of 24.     Physical Exam:   Appearance - well kempt   ENMT - clear nasal mucosa, midline nasal  septum, no oral exudates, no LAN, trachea midline, MP 4, enlarged tongue  Respiratory - normal chest wall, normal respiratory effort, no accessory muscle use, no wheeze/rales  CV - s1s2 regular rate and rhythm, no murmurs, no peripheral edema, radial pulses symmetric  GI - soft, non tender, no masses  Lymph - no adenopathy noted in neck and axillary areas  MSK - normal gait  Ext - no cyanosis, clubbing, or joint inflammation noted  Skin - no rashes, lesions, or ulcers  Neuro - normal strength, oriented x 3  Psych - normal mood and affect  Discussion:  He has snoring, sleep disruption, apnea and daytime sleepiness.  He has history  of hypertension, and CHF.  I am concerned he could have obstructive sleep apnea.  Assessment/Plan:   Snoring with excessive daytime sleepiness. - will need to arrange for a home sleep study  Obesity. - discussed how weight can impact sleep and risk for sleep disordered breathing - discussed options to assist with weight loss: combination of diet modification, cardiovascular and strength training exercises  Cardiovascular risk with history of combined CHF. - had an extensive discussion regarding the adverse health consequences related to untreated sleep disordered breathing - specifically discussed the risks for hypertension, coronary artery disease, cardiac dysrhythmias, cerebrovascular disease, and diabetes - lifestyle modification discussed - might need f/u with cardiology  Safe driving practices. - discussed how sleep disruption can increase risk of accidents, particularly when driving - safe driving practices were discussed  Therapies for obstructive sleep apnea. - if the sleep study shows significant sleep apnea, then various therapies for treatment were reviewed: CPAP, oral appliance, and surgical interventions   Patient Instructions  Will arrange for home sleep study Will call to arrange for follow up after sleep study reviewed    Chesley Mires, MD [facility] Pager: [phone] 12/15/2018, 4:47 PM  Flow Sheet     Pulmonary tests:    Sleep tests:  Cardiac tests:  Echo 02/19/17 >> EF 40 to 45%, grade 2 DD   Review of Systems:  Constitutional: Positive for fatigue and unexpected weight change.  HENT: Positive for congestion, rhinorrhea, sinus pressure and tinnitus.   Eyes: Positive for itching.  Respiratory: Positive for shortness of breath and wheezing.   Cardiovascular: Negative.   Gastrointestinal: Negative.   Endocrine: Negative.   Genitourinary: Positive for frequency.  Musculoskeletal: Positive for joint swelling.  Skin:  Negative.   Allergic/Immunologic: Negative.   Neurological: Negative.   Hematological: Negative.   Psychiatric/Behavioral: Negative.    Medications:   Allergies as of 12/15/2018   No Known Allergies     Medication List       Accurate as of December 15, 2018  4:47 PM. If you have any questions, ask your nurse or doctor.        amLODipine 10 MG tablet Commonly known as: NORVASC Take 1 tablet (10 mg total) by mouth daily.   atorvastatin 20 MG tablet Commonly known as: LIPITOR Take 1 tablet (20 mg total) by mouth daily.   hydrALAZINE 50 MG tablet Commonly known as: APRESOLINE Take 1 tablet (50 mg total) by mouth 3 (three) times daily.   hydrochlorothiazide 12.5 MG capsule Commonly known as: MICROZIDE Take 1 capsule (12.5 mg total) by mouth daily.   tadalafil 20 MG tablet Commonly known as: CIALIS Take 0.5-1 tablets (10-20 mg total) by mouth every other day as needed for erectile dysfunction.   valsartan 160 MG tablet Commonly known as: Diovan Take 1 tablet (160 mg total) by mouth daily.   Vitamin B1 100 MG Tabs Take 1 tablet by mouth once daily   zolpidem 10 MG tablet Commonly known as: AMBIEN Take 1 tablet (10 mg total) by mouth at bedtime as needed for sleep.       Past Surgical History:  He  has a past surgical history that includes Foot surgery.  Family History:  His family history includes CAD in his father; Dementia in his mother; Hypertension in his mother.  Social History:  He  reports that he has quit smoking. He smoked 1.00 pack per day. He has never used smokeless tobacco. He reports current alcohol use. He reports that he does not use drugs.

## 2018-12-15 NOTE — Progress Notes (Signed)
  Subjective:     Patient ID: Benjamin Hines, male   DOB: 07/01/1960, 59 y.o.   MRN: [id number]  HPI   Review of Systems  Constitutional: Positive for fatigue and unexpected weight change.  HENT: Positive for congestion, rhinorrhea, sinus pressure and tinnitus.   Eyes: Positive for itching.  Respiratory: Positive for shortness of breath and wheezing.   Cardiovascular: Negative.   Gastrointestinal: Negative.   Endocrine: Negative.   Genitourinary: Positive for frequency.  Musculoskeletal: Positive for joint swelling.  Skin: Negative.   Allergic/Immunologic: Negative.   Neurological: Negative.   Hematological: Negative.   Psychiatric/Behavioral: Negative.        Objective:   Physical Exam     Assessment:         Plan:

## 2018-12-19 ENCOUNTER — Other Ambulatory Visit: Payer: Self-pay | Admitting: Family Medicine

## 2018-12-19 MED ORDER — VALSARTAN 160 MG PO TABS: 160.0000 mg | ORAL_TABLET | Freq: Every day | ORAL | 0 refills | Status: DC

## 2018-12-19 MED ORDER — AMLODIPINE BESYLATE 10 MG PO TABS: 10.0000 mg | ORAL_TABLET | Freq: Every day | ORAL | 0 refills | Status: DC

## 2018-12-19 MED ORDER — HYDRALAZINE HCL 50 MG PO TABS: 50.0000 mg | ORAL_TABLET | Freq: Three times a day (TID) | ORAL | 0 refills | Status: DC

## 2018-12-19 NOTE — Telephone Encounter (Signed)
Medication Refill - Medication:  amLODipine (NORVASC) 10 MG tablet hydrALAZINE (APRESOLINE) 50 MG tablet  valsartan (DIOVAN) 160 MG tablet  Has the patient contacted their pharmacy?  Yes advised to call office. Pt would like filled today and a call when it is completed.   Preferred Pharmacy (with phone number or street name):  [facility] ([place] - [address] [phone] (Phone) [phone] (Fax)   Agent: Please be advised that RX refills may take up to 3 business days. We ask that you follow-up with your pharmacy.

## 2018-12-20 ENCOUNTER — Other Ambulatory Visit: Payer: Self-pay | Admitting: Family Medicine

## 2018-12-21 ENCOUNTER — Other Ambulatory Visit: Payer: Self-pay | Admitting: Family Medicine

## 2018-12-22 NOTE — Telephone Encounter (Signed)
He should be on 160 mg dose.   Not sure why 80 mg request coming to [place].

## 2018-12-26 ENCOUNTER — Other Ambulatory Visit: Payer: Self-pay

## 2018-12-26 ENCOUNTER — Encounter: Payer: Self-pay | Admitting: Family Medicine

## 2018-12-26 ENCOUNTER — Ambulatory Visit (INDEPENDENT_AMBULATORY_CARE_PROVIDER_SITE_OTHER): Payer: BLUE CROSS/BLUE SHIELD | Admitting: Family Medicine

## 2018-12-26 VITALS — BP 170/108 | HR 89 | Temp 97.9°F | Ht 69.0 in | Wt 195.7 lb

## 2018-12-26 DIAGNOSIS — I10 Essential (primary) hypertension: Secondary | ICD-10-CM

## 2018-12-26 MED ORDER — AMLODIPINE BESYLATE 10 MG PO TABS: 10.0000 mg | ORAL_TABLET | Freq: Every day | ORAL | 3 refills | Status: DC

## 2018-12-26 MED ORDER — HYDROCHLOROTHIAZIDE 12.5 MG PO CAPS: 12.5000 mg | ORAL_CAPSULE | Freq: Every day | ORAL | 3 refills | Status: DC

## 2018-12-26 MED ORDER — HYDRALAZINE HCL 50 MG PO TABS: 50.0000 mg | ORAL_TABLET | Freq: Three times a day (TID) | ORAL | 3 refills | Status: DC

## 2018-12-26 MED ORDER — VALSARTAN 160 MG PO TABS: 160.0000 mg | ORAL_TABLET | Freq: Every day | ORAL | 3 refills | Status: DC

## 2018-12-26 NOTE — Progress Notes (Signed)
  Subjective:     Patient ID: Benjamin Hines, male   DOB: 1960-02-06, 58 y.o.   MRN: [id number]  HPI Patient is here for follow-up regarding hypertension.  He is supposed to be on a regimen of amlodipine 10 mg daily, HCTZ 12.5 mg daily, valsartan 160 mg daily, and hydralazine 50 mg 3 times daily.  We had increased his hydralazine last visit.  He has history of poor compliance.  Our goal is to have him on all 4 medications consistently at follow-up today.  Unfortunately, there were some counter glitch at his pharmacy where he cannot get a couple of his medications refilled and he apparently is out of amlodipine and valsartan at this time.  He was referred recently for possible sleep studies to rule out obstructive sleep apnea.  Home sleep study is pending.  He denies any peripheral edema.  No headache.  No chest pain.  Has scaled back alcohol some  Past Medical History:  Diagnosis Date  . AAA (abdominal aortic aneurysm) without rupture ([place]) 12/27/2017  . Hyperlipidemia   . Hypertension    Past Surgical History:  Procedure Laterality Date  . FOOT SURGERY     left foot    reports that he has quit smoking. He smoked 1.00 pack per day. He has never used smokeless tobacco. He reports current alcohol use. He reports that he does not use drugs. family history includes CAD in his father; Dementia in his mother; Hypertension in his mother. No Known Allergies   Review of Systems  Constitutional: Negative for fatigue.  Eyes: Negative for visual disturbance.  Respiratory: Negative for cough, chest tightness and shortness of breath.   Cardiovascular: Negative for chest pain, palpitations and leg swelling.  Neurological: Negative for dizziness, syncope, weakness, light-headedness and headaches.       Objective:   Physical Exam Constitutional:      Appearance: He is well-developed.  HENT:     Right Ear: External ear normal.     Left Ear: External ear normal.  Eyes:     Pupils: Pupils are  equal, round, and reactive to light.  Neck:     Thyroid: No thyromegaly.  Cardiovascular:     Rate and Rhythm: Normal rate and regular rhythm.  Pulmonary:     Effort: Pulmonary effort is normal. No respiratory distress.     Breath sounds: Normal breath sounds. No wheezing or rales.  Musculoskeletal:     Cervical back: Neck supple.  Neurological:     Mental Status: He is alert and oriented to person, place, and time.        Assessment:     Poorly controlled hypertension.  I would hesitate to call this resistant hypertension yet since is not clear that he has had full compliance with his current regimen    Plan:     -Continue to lose weight and minimize alcohol intake -Continue to watch sodium closely -Refilled all 4 blood pressure medications today for 1 year and bring back in 1 month when he was on all 4 medications.  If blood pressure not better controlled at that point consider look for secondary causes and consider 24-hour urine metanephrine, plasma aldosterone, plasma renin activity, possible renal imaging -Continue sleep apnea work-up  Eulas Post MD [facility] at [facility]

## 2018-12-26 NOTE — Patient Instructions (Signed)
Get back on all 4 of your BP medications and take regularly.  Let's plan on 4 week follow up .

## 2019-01-11 ENCOUNTER — Other Ambulatory Visit: Payer: Self-pay

## 2019-01-11 MED ORDER — TADALAFIL 20 MG PO TABS: 10.0000 mg | ORAL_TABLET | ORAL | 11 refills | Status: DC | PRN

## 2019-01-12 ENCOUNTER — Other Ambulatory Visit: Payer: Self-pay | Admitting: Family Medicine

## 2019-01-12 NOTE — Telephone Encounter (Signed)
Refills okay with 5 additional refills

## 2019-01-27 ENCOUNTER — Encounter: Payer: Self-pay | Admitting: Family Medicine

## 2019-01-27 ENCOUNTER — Ambulatory Visit (INDEPENDENT_AMBULATORY_CARE_PROVIDER_SITE_OTHER): Payer: Self-pay | Admitting: Family Medicine

## 2019-01-27 ENCOUNTER — Other Ambulatory Visit: Payer: Self-pay

## 2019-01-27 VITALS — BP 124/78 | HR 91 | Temp 98.2°F | Ht 69.0 in | Wt 194.6 lb

## 2019-01-27 DIAGNOSIS — I714 Abdominal aortic aneurysm, without rupture, unspecified: Secondary | ICD-10-CM

## 2019-01-27 DIAGNOSIS — E785 Hyperlipidemia, unspecified: Secondary | ICD-10-CM

## 2019-01-27 DIAGNOSIS — I10 Essential (primary) hypertension: Secondary | ICD-10-CM

## 2019-01-27 LAB — LIPID PANEL
Cholesterol: 206 mg/dL — ABNORMAL HIGH (ref 0–200)
HDL: 46.5 mg/dL (ref >39.00)
NonHDL: 159.35
Total CHOL/HDL Ratio: 4
Triglycerides: 207 mg/dL — ABNORMAL HIGH (ref 0.0–149.0)
VLDL: 41.4 mg/dL — ABNORMAL HIGH (ref 0.0–40.0)

## 2019-01-27 LAB — LDL CHOLESTEROL, DIRECT: Direct LDL: 108 mg/dL

## 2019-01-27 NOTE — Progress Notes (Signed)
  Subjective:     Patient ID: Benjamin Hines, male   DOB: [birth year], 59 y.o.   MRN: [id number]  HPI Benjamin seen for follow-up.  He has history of severe hypertension.  When he was seen a month ago he was not on several of his medications.  Refills for all were given.  He states he is compliant with all at this time.  No headaches.  No dizziness.  No chest pains.  He has abdominal aortic aneurysm with dimensions 3.9 cm last year.  He has scheduled follow-up in February for that.  He quit smoking 5 years ago.  We reviewed previous imaging from a year ago  History of hyperlipidemia.  He is mostly Lipitor but apparently is not taking that currently.  His father had coronary disease in his late 28s.  Past Medical History:  Diagnosis Date  . AAA (abdominal aortic aneurysm) without rupture ([place]) 12/27/2017  . Hyperlipidemia   . Hypertension    Past Surgical History:  Procedure Laterality Date  . FOOT SURGERY     left foot    reports that he has quit smoking. He smoked 1.00 pack per day. He has never used smokeless tobacco. He reports current alcohol use. He reports that he does not use drugs. family history includes CAD in his father; Dementia in his mother; Hypertension in his mother. No Known Allergies   Review of Systems  Constitutional: Negative for chills, fatigue, fever and unexpected weight change.  Eyes: Negative for visual disturbance.  Respiratory: Negative for cough, chest tightness and shortness of breath.   Cardiovascular: Negative for chest pain, palpitations and leg swelling.  Neurological: Negative for dizziness, syncope, weakness, light-headedness and headaches.       Objective:   Physical Exam Constitutional:      Appearance: He is well-developed.  HENT:     Right Ear: External ear normal.     Left Ear: External ear normal.  Eyes:     Pupils: Pupils are equal, round, and reactive to light.  Neck:     Thyroid: No thyromegaly.  Cardiovascular:     Rate and  Rhythm: Normal rate and regular rhythm.  Pulmonary:     Effort: Pulmonary effort is normal. No respiratory distress.     Breath sounds: Normal breath sounds. No wheezing or rales.  Musculoskeletal:     Cervical back: Neck supple.  Neurological:     Mental Status: He is alert and oriented to person, place, and time.        Assessment:     #1 hypertension much improved with compliance with medications  #2 abdominal aortic aneurysm with dimension of 3.9 cm 1 year ago  #3 history of hyperlipidemia currently not taking Lipitor    Plan:     -Stressed compliance with medications at all times -Recheck lipid panel.  Will likely need to get back on Lipitor -Follow through with aneurysm screen in February -Schedule follow-up in 6 months to reassess and sooner as needed  Benjamin Post MD [facility] at [facility]

## 2019-01-27 NOTE — Patient Instructions (Signed)
Be sure to set up aneurysm follow up.   Let's plan on 6 month follow up  Consider trial of Pepcid 20 mg twice daily or Nexium or Prilosec 20 mg daily.

## 2019-01-30 ENCOUNTER — Other Ambulatory Visit: Payer: Self-pay

## 2019-01-30 MED ORDER — ATORVASTATIN CALCIUM 40 MG PO TABS: 40.0000 mg | ORAL_TABLET | Freq: Every day | ORAL | 1 refills | Status: DC

## 2019-02-09 ENCOUNTER — Other Ambulatory Visit: Payer: Self-pay

## 2019-02-09 ENCOUNTER — Ambulatory Visit: Payer: Self-pay

## 2019-02-09 DIAGNOSIS — R0683 Snoring: Secondary | ICD-10-CM

## 2019-02-09 DIAGNOSIS — G4733 Obstructive sleep apnea (adult) (pediatric): Secondary | ICD-10-CM

## 2019-02-13 ENCOUNTER — Telehealth: Payer: Self-pay | Admitting: Pulmonary Disease

## 2019-02-13 DIAGNOSIS — G4733 Obstructive sleep apnea (adult) (pediatric): Secondary | ICD-10-CM

## 2019-02-13 NOTE — Telephone Encounter (Signed)
HST 02/09/19 >> AHI 18.8, SpO2 low 69%   Please inform him that his sleep study shows moderate obstructive sleep apnea.  Please arrange for ROV with me or NP to discuss treatment options.

## 2019-02-14 NOTE — Telephone Encounter (Signed)
Called the patient to make him aware of the results. Patient voiced understanding. Resent mychart code to patient cell phone. Set up for video visit on 02/22/19 with Wyn Quaker, NP. Patient stated he will call back if not able to access.  Nothing further needed at this time.

## 2019-02-15 ENCOUNTER — Other Ambulatory Visit: Payer: Self-pay | Admitting: Family Medicine

## 2019-02-21 NOTE — Progress Notes (Signed)
Virtual Visit via Video Note  I connected with Benjamin Hines on 02/22/19 at 11:00 AM EST by a video enabled telemedicine application and verified that I am speaking with the correct person using two identifiers.  Location: Patient: Home Provider: Office - [facility] - [id number], [address], [place],  [address]  I discussed the limitations of evaluation and management by telemedicine and the availability of in person appointments. The patient expressed understanding and agreed to proceed. I also discussed with the patient that there may be a patient responsible charge related to this service. The patient expressed understanding and agreed to proceed.  Patient consented to consult via telephone: Yes People present and their role in pt care: Pt   History of Present Illness:  59 year old former smoker followed in our office for moderate obstructive sleep apnea  Past medical history: Hypertension, GERD, hyperlipidemia, history of AAA, chronic combined systolic and diastolic congestive heart failure Smoking history: Former smoker Maintenance: None Patient of Dr. Halford Chessman  Chief complaint: Review home sleep study results  59 year old male former smoker followed in our office for concerns regarding obstructive sleep apnea.  Patient recently completed a home sleep study in January/2021.  Home sleep study showed moderate obstructive sleep apnea.  We will review these results today.   Observations/Objective:  HST 02/09/19 >> AHI 18.8, SpO2 low 69%  Echo 02/19/17 >> EF 40 to 45%, grade 2 DD   Social History   Tobacco Use  Smoking Status Former Smoker  . Packs/day: 1.00  Smokeless Tobacco Never Used   Immunization History  Administered Date(s) Administered  . Tdap 04/26/2013    Assessment and Plan:  OSA (obstructive sleep apnea) Moderate obstructive sleep apnea on January/2021 home sleep study  Plan: We will start CPAP APAP 5-15 Mask of choice DME: Whichever is  closest to patient 39-month follow-up with our office    Follow Up Instructions:  Return in about 2 months (around 04/22/2019), or if symptoms worsen or fail to improve, for Follow up with Dr. Halford Chessman.    I discussed the assessment and treatment plan with the patient. The patient was provided an opportunity to ask questions and all were answered. The patient agreed with the plan and demonstrated an understanding of the instructions.   The patient was advised to call back or seek an in-person evaluation if the symptoms worsen or if the condition fails to improve as anticipated.  I provided 17 minutes of non-face-to-face time during this encounter.   Lauraine Rinne, NP

## 2019-02-22 ENCOUNTER — Telehealth (INDEPENDENT_AMBULATORY_CARE_PROVIDER_SITE_OTHER): Payer: Self-pay | Admitting: Pulmonary Disease

## 2019-02-22 ENCOUNTER — Encounter: Payer: Self-pay | Admitting: Pulmonary Disease

## 2019-02-22 DIAGNOSIS — G4733 Obstructive sleep apnea (adult) (pediatric): Secondary | ICD-10-CM | POA: Insufficient documentation

## 2019-02-22 NOTE — Assessment & Plan Note (Signed)
Moderate obstructive sleep apnea on January/2021 home sleep study  Plan: We will start CPAP APAP 5-15 Mask of choice DME: Whichever is closest to patient 2-month follow-up with our office

## 2019-02-22 NOTE — Patient Instructions (Signed)
You were seen today by Lauraine Rinne, NP  for:   1. OSA (obstructive sleep apnea)  New CPAP start DME: Whichever is closest to the patient >>> Likely either [employer] or [facility] APAP setting 5-15 Mask of choice Supplies  We recommend that you start using your CPAP daily >>>Keep up the hard work using your device >>> Goal should be wearing this for the entire night that you are sleeping, at least 4 to 6 hours  Remember:  . Do not drive or operate heavy machinery if tired or drowsy.  . Please notify the supply company and office if you are unable to use your device regularly due to missing supplies or machine being broken.  . Work on maintaining a healthy weight and following your recommended nutrition plan  . Maintain proper daily exercise and movement  . Maintaining proper use of your device can also help improve management of other chronic illnesses such as: Blood pressure, blood sugars, and weight management.   BiPAP/ CPAP Cleaning:  >>>Clean weekly, with Dawn soap, and bottle brush.  Set up to air dry. >>> Wipe mask out daily with wet wipe or towelette      Follow Up:    No follow-ups on file.   Please do your part to reduce the spread of COVID-19:      Reduce your risk of any infection  and COVID19 by using the similar precautions used for avoiding the common cold or flu:  Marland Kitchen Wash your hands often with soap and warm water for at least 20 seconds.  If soap and water are not readily available, use an alcohol-based hand sanitizer with at least 60% alcohol.  . If coughing or sneezing, cover your mouth and nose by coughing or sneezing into the elbow areas of your shirt or coat, into a tissue or into your sleeve (not your hands). Langley Gauss A MASK when in public  . Avoid shaking hands with others and consider head nods or verbal greetings only. . Avoid touching your eyes, nose, or mouth with unwashed hands.  . Avoid close contact with people who are sick. . Avoid places or events  with large numbers of people in one location, like concerts or sporting events. . If you have some symptoms but not all symptoms, continue to monitor at home and seek medical attention if your symptoms worsen. . If you are having a medical emergency, call 911.   [place] / e-Visit: [email or web address]         [facility]: [facility]: [id number]                   [facility]: [id number]     It is flu season:   >>> Best ways to protect herself from the flu: Receive the yearly flu vaccine, practice good hand hygiene washing with soap and also using hand sanitizer when available, eat a nutritious meals, get adequate rest, hydrate appropriately   Please contact the office if your symptoms worsen or you have concerns that you are not improving.   Thank you for choosing [facility] for your healthcare, and for allowing [place] to partner with you on your healthcare journey. I am thankful to be able to provide care to you today.   Wyn Quaker FNP-C

## 2019-02-22 NOTE — Progress Notes (Signed)
Reviewed and agree with assessment/plan.   Jaileigh Weimer, MD [facility] 01/08/2016, 12:24 PM Pager:  [phone]  

## 2019-02-22 NOTE — Addendum Note (Signed)
Addended by: Valerie Salts on: 02/22/2019 11:12 AM   Modules accepted: Orders

## 2019-03-07 ENCOUNTER — Other Ambulatory Visit (HOSPITAL_COMMUNITY): Payer: BLUE CROSS/BLUE SHIELD

## 2019-03-07 ENCOUNTER — Ambulatory Visit: Payer: BLUE CROSS/BLUE SHIELD | Admitting: Vascular Surgery

## 2019-04-20 ENCOUNTER — Other Ambulatory Visit: Payer: Self-pay | Admitting: *Deleted

## 2019-04-20 DIAGNOSIS — I714 Abdominal aortic aneurysm, without rupture, unspecified: Secondary | ICD-10-CM

## 2019-04-25 ENCOUNTER — Ambulatory Visit: Payer: Self-pay | Admitting: Vascular Surgery

## 2019-04-25 ENCOUNTER — Other Ambulatory Visit (HOSPITAL_COMMUNITY): Payer: Self-pay

## 2019-05-01 ENCOUNTER — Ambulatory Visit: Payer: Self-pay | Admitting: Pulmonary Disease

## 2019-05-30 ENCOUNTER — Other Ambulatory Visit (HOSPITAL_COMMUNITY): Payer: Self-pay

## 2019-05-30 ENCOUNTER — Ambulatory Visit: Payer: Self-pay | Admitting: Vascular Surgery

## 2019-06-09 ENCOUNTER — Telehealth (HOSPITAL_COMMUNITY): Payer: Self-pay

## 2019-06-09 NOTE — Telephone Encounter (Signed)

## 2019-06-13 ENCOUNTER — Other Ambulatory Visit (HOSPITAL_COMMUNITY): Payer: Self-pay

## 2019-06-13 ENCOUNTER — Ambulatory Visit: Payer: Self-pay | Admitting: Vascular Surgery

## 2019-06-14 ENCOUNTER — Encounter: Payer: Self-pay | Admitting: Pulmonary Disease

## 2019-06-14 ENCOUNTER — Ambulatory Visit (INDEPENDENT_AMBULATORY_CARE_PROVIDER_SITE_OTHER): Payer: 59 | Admitting: Pulmonary Disease

## 2019-06-14 ENCOUNTER — Other Ambulatory Visit: Payer: Self-pay

## 2019-06-14 VITALS — BP 136/86 | HR 98 | Temp 98.2°F | Ht 69.0 in | Wt 189.0 lb

## 2019-06-14 DIAGNOSIS — Z9989 Dependence on other enabling machines and devices: Secondary | ICD-10-CM | POA: Diagnosis not present

## 2019-06-14 DIAGNOSIS — Z789 Other specified health status: Secondary | ICD-10-CM | POA: Diagnosis not present

## 2019-06-14 DIAGNOSIS — G4733 Obstructive sleep apnea (adult) (pediatric): Secondary | ICD-10-CM | POA: Diagnosis not present

## 2019-06-14 NOTE — Progress Notes (Signed)
[facility]  Chief Complaint  Patient presents with  . Follow-up    pt states he can't wear the mask at night it bothers him    Constitutional:  BP 136/86 (BP Location: Left Arm, Cuff Size: Normal)   Pulse 98   Temp 98.2 F (36.8 C) (Oral)   Ht 5\' 9"  (1.753 m)   Wt 189 lb (85.7 kg)   SpO2 96%   BMI 27.91 kg/m   Past Medical History:  AAA, CHF, HTN, HLD  Brief Summary:  Benjamin Hines is a 59 y.o. male former smoker with obstructive sleep apnea.  Subjective:  He has been trying to use CPAP.  Goes to bed at 10 pm.  Falls asleep easily with CPAP.  Wakes up around 1 am because he feels that the mask is digging into his face and then he has to take mask off.  He has been using full face mask, and hasn't tried any other type of mask.  He feels that he is sleeping better with CPAP and has noticed his blood pressure has been better since using CPAP.  Physical Exam:   Appearance - well kempt   ENMT - no sinus tenderness, no oral exudate, no LAN, Mallampati 3 airway, no stridor, poor dentition  Respiratory - equal breath sounds bilaterally, no wheezing or rales  CV - s1s2 regular rate and rhythm, no murmurs  Ext - no clubbing, no edema  Skin - no rashes  Psych - normal mood and affect   Assessment/Plan:   Obstructive sleep apnea. - he reports benefit from CPAP - main issue is poor mask fit with use of full face mask - will have [facility] try to refit him to different type of mask - if this is unsuccessful, the next step would be to get mask refit in sleep lab - continue auto CPAP  A total of  23 minutes spent addressing patient care issues on day of visit.   Follow up:   Patient Instructions  Will arrange for CPAP mask refitting  Follow up in 6 months    Signature:  Chesley Mires, MD [place] Pager: [phone] 06/14/2019, 10:04 AM  Flow Sheet    Sleep tests:  HST 02/09/19 >> AHI 18.8, SpO2  low 69% Auto CPAP 06/03/19 to 06/12/19 >> used on 9 of 10 nights with average 3 hrs 41 min.  Average AHI 1.1 with median CPAP 9 and 95 th percentile CPAP 12 cm H2O.  Cardiac tests:  Echo 02/19/17 >> EF 40 to 45%, grade 2 DD  Medications:   Allergies as of 06/14/2019   No Known Allergies     Medication List       Accurate as of June 14, 2019 10:04 AM. If you have any questions, ask your nurse or doctor.        amLODipine 10 MG tablet Commonly known as: NORVASC Take 1 tablet (10 mg total) by mouth daily.   atorvastatin 40 MG tablet Commonly known as: LIPITOR Take 1 tablet (40 mg total) by mouth daily.   hydrALAZINE 50 MG tablet Commonly known as: APRESOLINE Take 1 tablet (50 mg total) by mouth 3 (three) times daily.   hydrochlorothiazide 12.5 MG capsule Commonly known as: MICROZIDE Take 1 capsule (12.5 mg total) by mouth daily.   tadalafil (PAH) 20 MG tablet Commonly known as: ADCIRCA TAKE 1/2 TO 1 (ONE-HALF TO ONE) TABLET BY MOUTH EVERY OTHER DAY AS NEEDED FOR  ERECTILE  DYSFUNCTION   tadalafil 20 MG tablet Commonly known as: CIALIS Take 0.5-1 tablets (10-20 mg total) by mouth every other day as needed for erectile dysfunction.   valsartan 160 MG tablet Commonly known as: Diovan Take 1 tablet (160 mg total) by mouth daily.   Vitamin B1 100 MG Tabs Take 1 tablet by mouth once daily   zolpidem 10 MG tablet Commonly known as: AMBIEN Take 1 tablet (10 mg total) by mouth at bedtime as needed for sleep.       Past Surgical History:  He  has a past surgical history that includes Foot surgery.  Family History:  His family history includes CAD in his father; Dementia in his mother; Hypertension in his mother.  Social History:  He  reports that he has quit smoking. He has a 20.00 pack-year smoking history. He has never used smokeless tobacco. He reports current alcohol use. He reports that he does not use drugs.

## 2019-06-14 NOTE — Progress Notes (Signed)
Pt DME: [facility]

## 2019-06-14 NOTE — Patient Instructions (Signed)
Will arrange for CPAP mask refitting  Follow up in 6 months 

## 2019-06-19 ENCOUNTER — Telehealth: Payer: Self-pay | Admitting: Pulmonary Disease

## 2019-06-19 NOTE — Telephone Encounter (Signed)
Called and spoke with patient. Informed him that I didn't see an recent calls to the patient from our office. Told him that it may be [facility] trying to get a hold of him for his CPAP mask since Dr. Halford Chessman placed an order the other day at his appointment. Patient said that he would try to give them a call. Nothing further needed at this time

## 2019-07-11 ENCOUNTER — Ambulatory Visit (HOSPITAL_COMMUNITY)
Admission: RE | Admit: 2019-07-11 | Discharge: 2019-07-11 | Disposition: A | Discharge status: Home / Self Care | Payer: 59 | Source: Ambulatory Visit | Attending: Vascular Surgery | Admitting: Vascular Surgery

## 2019-07-11 ENCOUNTER — Other Ambulatory Visit: Payer: Self-pay

## 2019-07-11 ENCOUNTER — Encounter: Payer: Self-pay | Admitting: Vascular Surgery

## 2019-07-11 ENCOUNTER — Ambulatory Visit (INDEPENDENT_AMBULATORY_CARE_PROVIDER_SITE_OTHER): Payer: 59 | Admitting: Vascular Surgery

## 2019-07-11 VITALS — BP 181/125 | HR 86 | Temp 97.7°F | Resp 16 | Ht 69.0 in | Wt 189.0 lb

## 2019-07-11 DIAGNOSIS — I714 Abdominal aortic aneurysm, without rupture, unspecified: Secondary | ICD-10-CM

## 2019-07-11 NOTE — Progress Notes (Signed)
Patient name: Benjamin Hines MRN: [id number] DOB: 15-Feb-1960 Sex: male  REASON FOR VISIT: 1 year follow-up for AAA  HPI: Benjamin Hines is a 59 y.o. male who presents for one-year follow-up after previously being evaluated in the hospital for stranding in his supra visceral aorta.  At the time he presented with abdominal pain and a CT was obtained in the ED that showed some stranding around the aorta.  Ultimately underwent a negative infectious work-up including negative blood cultures.  He got a repeat CT scan at 1 month follow-up after hospital discharge in 01/2018 that showed no further stranding.  He reports on follow-up today at one year no significant abdominal pain.  He has some intermittent back pain after long days at work.  He has gotten the Covid vaccine.  Not smoking.  Still working as a Dealer.  Past Medical History:  Diagnosis Date  . AAA (abdominal aortic aneurysm) without rupture ([place]) 12/27/2017  . Hyperlipidemia   . Hypertension     Past Surgical History:  Procedure Laterality Date  . FOOT SURGERY     left foot    Family History  Problem Relation Age of Onset  . Dementia Mother   . Hypertension Mother   . CAD Father     SOCIAL HISTORY: Social History   Tobacco Use  . Smoking status: Former Smoker    Packs/day: 1.00    Years: 20.00    Pack years: 20.00  . Smokeless tobacco: Never Used  Substance Use Topics  . Alcohol use: Yes    Alcohol/week: 0.0 standard drinks    Comment: 4-5 beers per night    No Known Allergies  Current Outpatient Medications  Medication Sig Dispense Refill  . amLODipine (NORVASC) 10 MG tablet Take 1 tablet (10 mg total) by mouth daily. 90 tablet 3  . atorvastatin (LIPITOR) 40 MG tablet Take 1 tablet (40 mg total) by mouth daily. 90 tablet 1  . hydrALAZINE (APRESOLINE) 50 MG tablet Take 1 tablet (50 mg total) by mouth 3 (three) times daily. 270 tablet 3  . hydrochlorothiazide (MICROZIDE) 12.5 MG capsule Take 1 capsule (12.5  mg total) by mouth daily. 90 capsule 3  . tadalafil (CIALIS) 20 MG tablet Take 0.5-1 tablets (10-20 mg total) by mouth every other day as needed for erectile dysfunction. 6 tablet 11  . tadalafil, PAH, (ADCIRCA) 20 MG tablet TAKE 1/2 TO 1 (ONE-HALF TO ONE) TABLET BY MOUTH EVERY OTHER DAY AS NEEDED FOR  ERECTILE  DYSFUNCTION 8 tablet 5  . Thiamine HCl (VITAMIN B1) 100 MG TABS Take 1 tablet by mouth once daily 30 tablet 0  . valsartan (DIOVAN) 160 MG tablet Take 1 tablet (160 mg total) by mouth daily. 90 tablet 3  . zolpidem (AMBIEN) 10 MG tablet Take 1 tablet (10 mg total) by mouth at bedtime as needed for sleep. 15 tablet 0   No current facility-administered medications for this visit.    REVIEW OF SYSTEMS:  [X]  denotes positive finding, [ ]  denotes negative finding Cardiac  Comments:  Chest pain or chest pressure:    Shortness of breath upon exertion:    Short of breath when lying flat:    Irregular heart rhythm:        Vascular    Pain in calf, thigh, or hip brought on by ambulation:    Pain in feet at night that wakes you up from your sleep:     Blood clot in your veins:  Leg swelling:         Pulmonary    Oxygen at home:    Productive cough:     Wheezing:         Neurologic    Sudden weakness in arms or legs:     Sudden numbness in arms or legs:     Sudden onset of difficulty speaking or slurred speech:    Temporary loss of vision in one eye:     Problems with dizziness:         Gastrointestinal    Blood in stool:     Vomited blood:         Genitourinary    Burning when urinating:     Blood in urine:        Psychiatric    Major depression:         Hematologic    Bleeding problems:    Problems with blood clotting too easily:        Skin    Rashes or ulcers:        Constitutional    Fever or chills:      PHYSICAL EXAM: Vitals:   07/11/19 0832  BP: (!) 181/125  Pulse: 86  Resp: 16  Temp: 97.7 F (36.5 C)  TempSrc: Temporal  SpO2: 98%  Weight: 189  lb (85.7 kg)  Height: 5\' 9"  (1.753 m)    GENERAL: The patient is a well-nourished male, in no acute distress. The vital signs are documented above. CARDIAC: There is a regular rate and rhythm.  VASCULAR:  2+ femoral pulse palpable bilateral groins 2+ dorsalis pedis pulses palpable bilateral lower extremities PULMONARY: There is good air exchange bilaterally without wheezing or rales. ABDOMEN: Soft and non-tender.  No rebound or guarding.  No pain with deep palpation. MUSCULOSKELETAL: There are no major deformities or cyanosis. NEUROLOGIC: No focal weakness or paresthesias are detected.   DATA:   CTA abdomen pelvis from 01/21/2018 with no overt inflammation around the aorta and the suprarenal aorta measured maximal diameter of 3.9 cm   AAA duplex today shows maximal proximal aortic diameter of 4 cm slightly increased from 3.9 on CT last year.  Assessment/Plan:  59 year old male presents for 1 year follow-up for ongoing surveillance of his suprarenal aorta previously measuring 3.9 cm now only minimally changed 4.0 cm.  He was initially evaluated for aortitis early last year and I was I was initially concerned with possible infection.  Ultimately his infectious work-up was negative and repeat scan with CT last year showed resolution of the stranding.  No significant change in his aortic diameter on ultrasound today and he remains asymptomatic.  Discussed very encouraging findings given no continued degeneration of his aorta.  Will arrange follow-up again in 1 year with AAA duplex here in the office.  Call with questions or concerns.   [name], MD [facility] Office: [phone]

## 2019-07-28 ENCOUNTER — Ambulatory Visit: Payer: Self-pay | Admitting: Family Medicine

## 2019-08-28 ENCOUNTER — Ambulatory Visit: Payer: Self-pay | Admitting: Family Medicine

## 2019-08-28 DIAGNOSIS — Z0289 Encounter for other administrative examinations: Secondary | ICD-10-CM

## 2019-10-24 ENCOUNTER — Other Ambulatory Visit: Payer: Self-pay

## 2019-10-25 ENCOUNTER — Ambulatory Visit (INDEPENDENT_AMBULATORY_CARE_PROVIDER_SITE_OTHER): Payer: 59 | Admitting: Family Medicine

## 2019-10-25 ENCOUNTER — Encounter: Payer: Self-pay | Admitting: Family Medicine

## 2019-10-25 VITALS — BP 220/100 | HR 100 | Temp 98.3°F | Ht 69.0 in | Wt 190.5 lb

## 2019-10-25 DIAGNOSIS — I10 Essential (primary) hypertension: Secondary | ICD-10-CM

## 2019-10-25 DIAGNOSIS — Z23 Encounter for immunization: Secondary | ICD-10-CM

## 2019-10-25 DIAGNOSIS — R1030 Lower abdominal pain, unspecified: Secondary | ICD-10-CM

## 2019-10-25 LAB — POCT URINALYSIS DIP (MANUAL ENTRY)
Bilirubin, UA: NEGATIVE
Blood, UA: NEGATIVE
Glucose, UA: NEGATIVE mg/dL
Ketones, POC UA: NEGATIVE mg/dL
Leukocytes, UA: NEGATIVE
Nitrite, UA: NEGATIVE
Protein Ur, POC: NEGATIVE mg/dL
Spec Grav, UA: 1.01 (ref 1.010–1.025)
Urobilinogen, UA: 0.2 E.U./dL
pH, UA: 5 (ref 5.0–8.0)

## 2019-10-25 MED ORDER — PANTOPRAZOLE SODIUM 40 MG PO TBEC: 40.0000 mg | DELAYED_RELEASE_TABLET | Freq: Every day | ORAL | 0 refills | Status: DC

## 2019-10-25 NOTE — Progress Notes (Signed)
Established Patient Office Visit  Subjective:  Patient ID: Benjamin Hines, male    DOB: 05-Aug-1960  Age: 59 y.o. MRN: [id number]  CC:  Chief Complaint  Patient presents with  . Abdominal Pain    for 1 wk now, lower ab in with burning. Has lots of belching. Hurts more when lying down. Taking tylenol for the pain    HPI [name] presents for diffuse lower abdominal discomfort.  He has longstanding history of hypertension blood pressure very high today be states he has not taken his medicines past 2 days.  He relates that after he takes his blood pressure medications he has burning sensation.  The area he points to his periumbilical.  No epigastric discomfort.  He has had frequent gas and burping the past few days.  No nausea or vomiting.  Bowel movements have been regular.  Denies any diarrhea or constipation.  No bloody stools.  Last colonoscopy was 2010.  He has 4 cm AAA followed by vascular surgery.  No back pain.   He states his blood pressures been doing very good when he was taking his amlodipine, hydralazine, HCTZ, valsartan regularly.  He has not taken his BP medications past two days.  He also is on atorvastatin for hyperlipidemia.  He notices that when he eats spicy foods he frequently has increased burning sensation as well.  Has not tried any acid suppressors.  No melena.  Past Medical History:  Diagnosis Date  . AAA (abdominal aortic aneurysm) without rupture ([place]) 12/27/2017  . Hyperlipidemia   . Hypertension     Past Surgical History:  Procedure Laterality Date  . FOOT SURGERY     left foot    Family History  Problem Relation Age of Onset  . Dementia Mother   . Hypertension Mother   . CAD Father     Social History   Socioeconomic History  . Marital status: Married    Spouse name: Not on file  . Number of children: Not on file  . Years of education: Not on file  . Highest education level: Not on file  Occupational History  . Not on file    Tobacco Use  . Smoking status: Former Smoker    Packs/day: 1.00    Years: 20.00    Pack years: 20.00  . Smokeless tobacco: Never Used  Vaping Use  . Vaping Use: Former  Substance and Sexual Activity  . Alcohol use: Yes    Alcohol/week: 0.0 standard drinks    Comment: 4-5 beers per night  . Drug use: No  . Sexual activity: Not on file  Other Topics Concern  . Not on file  Social History Narrative  . Not on file   Social Determinants of Health   Financial Resource Strain:   . Difficulty of Paying Living Expenses: Not on file  Food Insecurity:   . Worried About Charity fundraiser in the Last Year: Not on file  . Ran Out of Food in the Last Year: Not on file  Transportation Needs:   . Lack of Transportation (Medical): Not on file  . Lack of Transportation (Non-Medical): Not on file  Physical Activity:   . Days of Exercise per Week: Not on file  . Minutes of Exercise per Session: Not on file  Stress:   . Feeling of Stress : Not on file  Social Connections:   . Frequency of Communication with Friends and Family: Not on file  . Frequency of Social  Gatherings with Friends and Family: Not on file  . Attends Religious Services: Not on file  . Active Member of Clubs or Organizations: Not on file  . Attends Archivist Meetings: Not on file  . Marital Status: Not on file  Intimate Partner Violence:   . Fear of Current or Ex-Partner: Not on file  . Emotionally Abused: Not on file  . Physically Abused: Not on file  . Sexually Abused: Not on file    Outpatient Medications Prior to Visit  Medication Sig Dispense Refill  . amLODipine (NORVASC) 10 MG tablet Take 1 tablet (10 mg total) by mouth daily. 90 tablet 3  . atorvastatin (LIPITOR) 40 MG tablet Take 1 tablet (40 mg total) by mouth daily. 90 tablet 1  . hydrALAZINE (APRESOLINE) 50 MG tablet Take 1 tablet (50 mg total) by mouth 3 (three) times daily. 270 tablet 3  . hydrochlorothiazide (MICROZIDE) 12.5 MG capsule  Take 1 capsule (12.5 mg total) by mouth daily. 90 capsule 3  . tadalafil (CIALIS) 20 MG tablet Take 0.5-1 tablets (10-20 mg total) by mouth every other day as needed for erectile dysfunction. 6 tablet 11  . tadalafil, PAH, (ADCIRCA) 20 MG tablet TAKE 1/2 TO 1 (ONE-HALF TO ONE) TABLET BY MOUTH EVERY OTHER DAY AS NEEDED FOR  ERECTILE  DYSFUNCTION 8 tablet 5  . Thiamine HCl (VITAMIN B1) 100 MG TABS Take 1 tablet by mouth once daily 30 tablet 0  . valsartan (DIOVAN) 160 MG tablet Take 1 tablet (160 mg total) by mouth daily. 90 tablet 3  . zolpidem (AMBIEN) 10 MG tablet Take 1 tablet (10 mg total) by mouth at bedtime as needed for sleep. 15 tablet 0   No facility-administered medications prior to visit.    No Known Allergies  ROS Review of Systems  Constitutional: Negative for chills and fever.  Respiratory: Negative for cough and shortness of breath.   Cardiovascular: Negative for chest pain.  Gastrointestinal: Positive for abdominal pain. Negative for abdominal distention, blood in stool, constipation, diarrhea, nausea and vomiting.  Genitourinary: Negative for dysuria.  Musculoskeletal: Negative for back pain.  Neurological: Negative for dizziness.      Objective:    Physical Exam Vitals reviewed.  Constitutional:      Appearance: He is well-developed.  Abdominal:     General: Abdomen is flat. Bowel sounds are normal.     Palpations: There is no hepatomegaly, mass or pulsatile mass.     Tenderness: There is no abdominal tenderness. There is no guarding or rebound.     Hernia: No hernia is present.  Neurological:     Mental Status: He is alert.     BP (!) 220/100   Pulse 100   Temp 98.3 F (36.8 C) (Oral)   Ht 5\' 9"  (1.753 m)   Wt 190 lb 8 oz (86.4 kg)   SpO2 95%   BMI 28.13 kg/m  Wt Readings from Last 3 Encounters:  10/25/19 190 lb 8 oz (86.4 kg)  07/11/19 189 lb (85.7 kg)  06/14/19 189 lb (85.7 kg)     Health Maintenance Due  Topic Date Due  . COVID-19 Vaccine  (1) Never done  . COLONOSCOPY  03/15/2018    There are no preventive care reminders to display for this patient.  Lab Results  Component Value Date   TSH 1.370 02/23/2017   Lab Results  Component Value Date   WBC 8.4 11/04/2018   HGB 15.2 11/04/2018   HCT 46.9 11/04/2018  MCV 90.7 11/04/2018   PLT 245.0 11/04/2018   Lab Results  Component Value Date   NA 139 11/04/2018   K 4.3 11/04/2018   CO2 27 11/04/2018   GLUCOSE 89 11/04/2018   BUN 11 11/04/2018   CREATININE 0.93 11/04/2018   BILITOT 0.6 11/04/2018   ALKPHOS 81 11/04/2018   AST 23 11/04/2018   ALT 25 11/04/2018   PROT 7.2 11/04/2018   ALBUMIN 4.4 11/04/2018   CALCIUM 9.4 11/04/2018   ANIONGAP 10 01/02/2018   GFR 100.20 11/04/2018   Lab Results  Component Value Date   CHOL 206 (H) 01/27/2019   Lab Results  Component Value Date   HDL 46.50 01/27/2019   Lab Results  Component Value Date   LDLCALC 82 02/18/2018   Lab Results  Component Value Date   TRIG 207.0 (H) 01/27/2019   Lab Results  Component Value Date   CHOLHDL 4 01/27/2019   No results found for: HGBA1C    Assessment & Plan:   #1 diffuse lower abdominal pain.  Benign exam.  Normal bowel sounds and no distention and no localizing tenderness.  No evidence for bowel obstruction.  He described burning sensation but symptoms seem to be somewhat low for gastric source.  He describes some dyspepsia, bloating.    -Discussed dietary modification.  Trial of Protonix 40 mg once daily.  3-week office follow-up to reassess -He was also given information on low FODMAP diet -Consider over-the-counter medications such as Gas-X -Recommend trial of probiotics such as Activia -he knows to call 911 or go to ER for any worsening pain.  #2 hypertension with very high reading and poorly controlled today but out of medication past 2 days -Start back tonight getting back on his usual medications and office follow-up in 3 weeks to reassess.  Plan labs at that  point including electrolytes, hepatic panel, and lipids  #3 AAA- followed by vascular.  Needs follow up imaging yearly as per their recommendations - last studied 07/11/19  #4 HM- pt requesting Shingrix vaccine.  Will need second one in 2-6 months.   Meds ordered this encounter  Medications  . pantoprazole (PROTONIX) 40 MG tablet    Sig: Take 1 tablet (40 mg total) by mouth daily.    Dispense:  30 tablet    Refill:  0    Follow-up: Return in about 3 weeks (around 11/15/2019).    Carolann Littler, MD

## 2019-10-25 NOTE — Patient Instructions (Addendum)
Low-FODMAP Eating Plan  FODMAPs (fermentable oligosaccharides, disaccharides, monosaccharides, and polyols) are sugars that are hard for some people to digest. A low-FODMAP eating plan may help some people who have bowel (intestinal) diseases to manage their symptoms. This meal plan can be complicated to follow. Work with a diet and nutrition specialist (dietitian) to make a low-FODMAP eating plan that is right for you. A dietitian can make sure that you get enough nutrition from this diet. What are tips for following this plan? Reading food labels  Check labels for hidden FODMAPs such as: ? High-fructose syrup. ? Honey. ? Agave. ? Natural fruit flavors. ? Onion or garlic powder.  Choose low-FODMAP foods that contain 3-4 grams of fiber per serving.  Check food labels for serving sizes. Eat only one serving at a time to make sure FODMAP levels stay low. Meal planning  Follow a low-FODMAP eating plan for up to 6 weeks, or as told by your health care provider or dietitian.  To follow the eating plan: 1. Eliminate high-FODMAP foods from your diet completely. 2. Gradually reintroduce high-FODMAP foods into your diet one at a time. Most people should wait a few days after introducing one high-FODMAP food before they introduce the next high-FODMAP food. Your dietitian can recommend how quickly you may reintroduce foods. 3. Keep a daily record of what you eat and drink, and make note of any symptoms that you have after eating. 4. Review your daily record with a dietitian regularly. Your dietitian can help you identify which foods you can eat and which foods you should avoid. General tips  Drink enough fluid each day to keep your urine pale yellow.  Avoid processed foods. These often have added sugar and may be high in FODMAPs.  Avoid most dairy products, whole grains, and sweeteners.  Work with a dietitian to make sure you get enough fiber in your diet. Recommended  foods Grains  Gluten-free grains, such as rice, oats, buckwheat, quinoa, corn, polenta, and millet. Gluten-free pasta, bread, or cereal. Rice noodles. Corn tortillas. Vegetables  Eggplant, zucchini, cucumber, peppers, green beans, Brussels sprouts, bean sprouts, lettuce, arugula, kale, Swiss chard, spinach, collard greens, bok choy, summer squash, potato, and tomato. Limited amounts of corn, carrot, and sweet potato. Green parts of scallions. Fruits  Bananas, oranges, lemons, limes, blueberries, raspberries, strawberries, grapes, cantaloupe, honeydew melon, kiwi, papaya, passion fruit, and pineapple. Limited amounts of dried cranberries, banana chips, and shredded coconut. Dairy  Lactose-free milk, yogurt, and kefir. Lactose-free cottage cheese and ice cream. Non-dairy milks, such as almond, coconut, hemp, and rice milk. Yogurts made of non-dairy milks. Limited amounts of goat cheese, brie, mozzarella, parmesan, swiss, and other hard cheeses. Meats and other protein foods  Unseasoned beef, pork, poultry, or fish. Eggs. Berniece Salines. Tofu (firm) and tempeh. Limited amounts of nuts and seeds, such as almonds, walnuts, [place] nuts, pecans, peanuts, pumpkin seeds, chia seeds, and sunflower seeds. Fats and oils  Butter-free spreads. Vegetable oils, such as olive, canola, and sunflower oil. Seasoning and other foods  Artificial sweeteners with names that do not end in "ol" such as aspartame, saccharine, and stevia. Maple syrup, white table sugar, raw sugar, brown sugar, and molasses. Fresh basil, coriander, parsley, rosemary, and thyme. Beverages  Water and mineral water. Sugar-sweetened soft drinks. Small amounts of orange juice or cranberry juice. Black and green tea. Most dry wines. Coffee. This may not be a complete list of low-FODMAP foods. Talk with your dietitian for more information. Foods to avoid Grains  Wheat, including kamut, durum, and semolina. Barley and bulgur. Couscous. Wheat-based  cereals. Wheat noodles, bread, crackers, and pastries. Vegetables  Chicory root, artichoke, asparagus, cabbage, snow peas, sugar snap peas, mushrooms, and cauliflower. Onions, garlic, leeks, and the white part of scallions. Fruits  Fresh, dried, and juiced forms of apple, pear, watermelon, peach, plum, cherries, apricots, blackberries, boysenberries, figs, nectarines, and mango. Avocado. Dairy  Milk, yogurt, ice cream, and soft cheese. Cream and sour cream. Milk-based sauces. Custard. Meats and other protein foods  Fried or fatty meat. Sausage. Cashews and pistachios. Soybeans, baked beans, black beans, chickpeas, kidney beans, fava beans, navy beans, lentils, and split peas. Seasoning and other foods  Any sugar-free gum or candy. Foods that contain artificial sweeteners such as sorbitol, mannitol, isomalt, or xylitol. Foods that contain honey, high-fructose corn syrup, or agave. Bouillon, vegetable stock, beef stock, and chicken stock. Garlic and onion powder. Condiments made with onion, such as hummus, chutney, pickles, relish, salad dressing, and salsa. Tomato paste. Beverages  Chicory-based drinks. Coffee substitutes. Chamomile tea. Fennel tea. Sweet or fortified wines such as port or sherry. Diet soft drinks made with isomalt, mannitol, maltitol, sorbitol, or xylitol. Apple, pear, and mango juice. Juices with high-fructose corn syrup. This may not be a complete list of high-FODMAP foods. Talk with your dietitian to discuss what dietary choices are best for you.  Summary  A low-FODMAP eating plan is a short-term diet that eliminates FODMAPs from your diet to help ease symptoms of certain bowel diseases.  The eating plan usually lasts up to 6 weeks. After that, high-FODMAP foods are restarted gradually, one at a time, so you can find out which may be causing symptoms.  A low-FODMAP eating plan can be complicated. It is best to work with a dietitian who has experience with this type of  plan. This information is not intended to replace advice given to you by your health care provider. Make sure you discuss any questions you have with your health care provider. Document Revised: 12/11/2016 Document Reviewed: 08/25/2016 Elsevier Patient Education  2020 [place] back on your BP medication now!  Start the Protonix 40 mg daily  Consider probiotic such as Activia yogurt  Follow up in 3 weeks and sooner for any vomiting, worsening abdominal pain, or other concern.

## 2019-10-26 ENCOUNTER — Telehealth: Payer: Self-pay | Admitting: Family Medicine

## 2019-10-26 NOTE — Telephone Encounter (Signed)
The patient contacted the office to get advise about his arm hurting and body aches after getting the shingles injection.  Please advise

## 2019-10-27 NOTE — Telephone Encounter (Signed)
Pt is asking for advice for sore arm due to shingle vaccine. Called pt, no answer

## 2019-10-27 NOTE — Telephone Encounter (Signed)
Soreness is not uncommon and will likely be resolving by next week.  As long as there is no significant redness would observe for now

## 2019-11-15 ENCOUNTER — Ambulatory Visit: Payer: 59 | Admitting: Family Medicine

## 2019-11-25 LAB — COLOGUARD

## 2019-11-29 ENCOUNTER — Ambulatory Visit: Payer: 59 | Admitting: Family Medicine

## 2019-12-06 ENCOUNTER — Ambulatory Visit: Payer: 59 | Admitting: Family Medicine

## 2019-12-20 ENCOUNTER — Telehealth: Payer: Self-pay | Admitting: Family Medicine

## 2019-12-20 NOTE — Telephone Encounter (Signed)
PT  would like to know what he can take for a sinus infection  In between time .because he has high BP. He has an appointment  for 12/09 to see Dr. Maudie Mercury for his sinus infection

## 2019-12-20 NOTE — Telephone Encounter (Signed)
Advised patient to speak with pharmacist or wait until appointment.

## 2019-12-20 NOTE — Telephone Encounter (Signed)
Pt is calling in stating that he was calling to see if he could take something OTC in the mean time before he has his appointment on 12/21/2019 w/Dr. Maudie Mercury.  Pt would like to have a call back he is aware the it is 24-48 hrs before msgs are answered due to them seeing pts.

## 2019-12-21 ENCOUNTER — Encounter: Payer: Self-pay | Admitting: Family Medicine

## 2019-12-21 ENCOUNTER — Telehealth (INDEPENDENT_AMBULATORY_CARE_PROVIDER_SITE_OTHER): Payer: 59 | Admitting: Family Medicine

## 2019-12-21 ENCOUNTER — Other Ambulatory Visit: Payer: Self-pay

## 2019-12-21 DIAGNOSIS — R059 Cough, unspecified: Secondary | ICD-10-CM | POA: Diagnosis not present

## 2019-12-21 DIAGNOSIS — R0981 Nasal congestion: Secondary | ICD-10-CM

## 2019-12-21 MED ORDER — BENZONATATE 100 MG PO CAPS: 100.0000 mg | ORAL_CAPSULE | Freq: Three times a day (TID) | ORAL | 0 refills | Status: DC | PRN

## 2019-12-21 NOTE — Patient Instructions (Addendum)
   ---------------------------------------------------------------------------------------------------------------------------      WORK SLIP:  Patient Benjamin Hines,  1960/10/30, was seen for a medical visit today, 12/21/19 . Please excuse from work according to the [facility] guidelines for a COVID like illness. We advise 10 days minimum from the onset of symptoms (12/19/2019) PLUS 1 day of no fever and improved symptoms. Will defer to employer for a sooner return to work if [place] testing is negative and the symptoms have resolved. Advise following CDC guidelines.    Sincerely: E-signature: Dr. Colin Benton, DO [facility] [place] Ph: [phone]   ------------------------------------------------------------------------------------------------------------------------------     HOME CARE TIPS:   -I sent the medication(s) we discussed to your pharmacy: Meds ordered this encounter  Medications  . benzonatate (TESSALON PERLES) 100 MG capsule    Sig: Take 1 capsule (100 mg total) by mouth 3 (three) times daily as needed.    Dispense:  20 capsule    Refill:  0      -can use tylenol or aleve if needed for fevers, aches and pains per instructions  -can use nasal saline a few times per day if nasal congestion, sometime a short course of Afrin nasal spray for 3 days can help as well  -stay hydrated, drink plenty of fluids and eat small healthy meals - avoid dairy   -follow up with your doctor in 2-3 days unless improving and feeling better  -stay home while sick, except to seek medical care  It was nice to meet you today, and I really hope you are feeling better soon. I help [facility] out with telemedicine visits on Tuesdays and Thursdays and am available for visits on those days. If you have any concerns or questions following this visit please schedule a follow up visit with your Primary Care doctor or seek care at a local urgent care clinic to avoid delays in care.     Seek in person care promptly if your symptoms worsen, new concerns arise or you are not improving with treatment. Call 911 and/or seek emergency care if you symptoms are severe or life threatening.

## 2019-12-21 NOTE — Progress Notes (Signed)
Virtual Visit via Telephone Note  I connected with Benjamin Hines on 12/21/19 at 11:00 AM EST by telephone and verified that I am speaking with the correct person using two identifiers.   I discussed the limitations, risks, security and privacy concerns of performing an evaluation and management service by telephone and the availability of in person appointments. I also discussed with the patient that there may be a patient responsible charge related to this service. The patient expressed understanding and agreed to proceed.  Location patient: home, [place] Location provider: work or home office Participants present for the call: patient, provider Patient did not have a visit with me in the prior 7 days to address this/these issue(s).   History of Present Illness:  Acute telemedicine visit for sinus issues: -Onset: 2 days ago -Symptoms include: nasal sinus congestion, HA, PND, cough -Denies:fevers, CP, SOB, body aches, NVD, inability to get out of bed/eat/drink -Has tried: nothing -Pertinent past medical history: see chart -Pertinent medication allergies:nkda -COVID-19 vaccine status: fully vaccinated for COVID19, no booster yet, has not had flu shot -no known sick contacts -reports had negative covid test   Observations/Objective: Patient sounds cheerful and well on the phone. I do not appreciate any SOB. Speech and thought processing are grossly intact. Patient reported vitals:  Assessment and Plan:  Nasal congestion  Cough  -we discussed possible serious and likely etiologies, options for evaluation and workup, limitations of telemedicine visit vs in person visit, treatment, treatment risks and precautions. Pt prefers to treat via telemedicine empirically rather than in person at this moment.  Query viral upper respiratory illness versus other.  Opted for treatment with Tessalon for cough, prescription sent, nasal saline, short course of nasal decongestant, analgesic if needed.   He had a negative Covid test already and is fully vaccinated for Covid. Work/School slipped offered: provided in patient instructions   Scheduled follow up with PCP offered: Agrees to follow-up if needed. Advised to seek prompt in person care if worsening, new symptoms arise, or if is not improving with treatment. Advised of options for inperson care in case PCP office not available. Did let the patient know that I only do telemedicine shifts for [facility] on Tuesdays and Thursdays and advised a follow up visit with PCP or at an [facility] if has further questions or concerns.   Follow Up Instructions:  I did not refer this patient for an OV with me in the next 24 hours for this/these issue(s).  I discussed the assessment and treatment plan with the patient. The patient was provided an opportunity to ask questions and all were answered. The patient agreed with the plan and demonstrated an understanding of the instructions.   I spent 15 minutes on this encounter.   Lucretia Kern, DO

## 2020-03-06 ENCOUNTER — Other Ambulatory Visit: Payer: Self-pay | Admitting: Internal Medicine

## 2020-03-07 LAB — LIPID PANEL
Cholesterol: 217 mg/dL — ABNORMAL HIGH (ref <200)
HDL: 46 mg/dL (ref >=40)
LDL Cholesterol (Calc): 124 mg/dL (calc) — ABNORMAL HIGH
Non-HDL Cholesterol (Calc): 171 mg/dL (calc) — ABNORMAL HIGH (ref <130)
Total CHOL/HDL Ratio: 4.7 (calc) (ref <5.0)
Triglycerides: 328 mg/dL — ABNORMAL HIGH (ref <150)

## 2020-03-07 LAB — CBC
HCT: 43.4 % (ref 38.5–50.0)
Hemoglobin: 14.6 g/dL (ref 13.2–17.1)
MCH: 29.4 pg (ref 27.0–33.0)
MCHC: 33.6 g/dL (ref 32.0–36.0)
MCV: 87.5 fL (ref 80.0–100.0)
MPV: 11.3 fL (ref 7.5–12.5)
Platelets: 252 10*3/uL (ref 140–400)
RBC: 4.96 10*6/uL (ref 4.20–5.80)
RDW: 13 % (ref 11.0–15.0)
WBC: 8.9 10*3/uL (ref 3.8–10.8)

## 2020-03-07 LAB — COMPLETE METABOLIC PANEL WITH GFR
AG Ratio: 1.7 (calc) (ref 1.0–2.5)
ALT: 25 U/L (ref 9–46)
AST: 23 U/L (ref 10–35)
Albumin: 4.5 g/dL (ref 3.6–5.1)
Alkaline phosphatase (APISO): 79 U/L (ref 35–144)
BUN: 18 mg/dL (ref 7–25)
CO2: 24 mmol/L (ref 20–32)
Calcium: 9.6 mg/dL (ref 8.6–10.3)
Chloride: 105 mmol/L (ref 98–110)
Creat: 0.99 mg/dL (ref 0.70–1.25)
GFR, Est African American: 95 mL/min/{1.73_m2} (ref >=60)
GFR, Est Non African American: 82 mL/min/{1.73_m2} (ref >=60)
Globulin: 2.7 g/dL (calc) (ref 1.9–3.7)
Glucose, Bld: 66 mg/dL (ref 65–99)
Potassium: 4.3 mmol/L (ref 3.5–5.3)
Sodium: 141 mmol/L (ref 135–146)
Total Bilirubin: 0.4 mg/dL (ref 0.2–1.2)
Total Protein: 7.2 g/dL (ref 6.1–8.1)

## 2020-03-07 LAB — TSH: TSH: 1.96 mIU/L (ref 0.40–4.50)

## 2020-03-07 LAB — VITAMIN D 25 HYDROXY (VIT D DEFICIENCY, FRACTURES): Vit D, 25-Hydroxy: 24 ng/mL — ABNORMAL LOW (ref 30–100)

## 2020-03-07 LAB — PSA: PSA: 5.82 ng/mL — ABNORMAL HIGH (ref <=4.0)

## 2020-03-14 ENCOUNTER — Encounter: Payer: Self-pay | Admitting: Gastroenterology

## 2020-04-25 ENCOUNTER — Other Ambulatory Visit: Payer: Self-pay | Admitting: Family Medicine

## 2020-05-09 IMAGING — CT CT CTA ABD/PEL W/CM AND/OR W/O CM
2 of 6 series · 14 of 46 positions shown, 16 images · IV contrast (iopamidol)
Comparison: CT 12/27/2017 and previous

CLINICAL DATA: Abdominal pain and mild stranding around suprarenal
aorta and small aneurysm. No growth on blood cultures to r/o
infectious etiology. Afebrile. Abdominal pain resolved with
aggressive control of BP and bowel regimen

EXAM:
CTA ABDOMEN AND PELVIS WITH CONTRAST
TECHNIQUE: Multidetector CT imaging of the abdomen and pelvis was performed
using the standard protocol during bolus administration of
intravenous contrast. Multiplanar reconstructed images and MIPs were
obtained and reviewed to evaluate the vascular anatomy.
CONTRAST:  100mL [id number]-OMD IOPAMIDOL (IU8ZDR-OMD) INJECTION 76%

[Series 5: thoracic cta 2mm · axial · 0.83mm/px · z∈[-506,-82]mm · 11 of 254 slices shown, 13 images]
[im 21/254  soft-tissue]
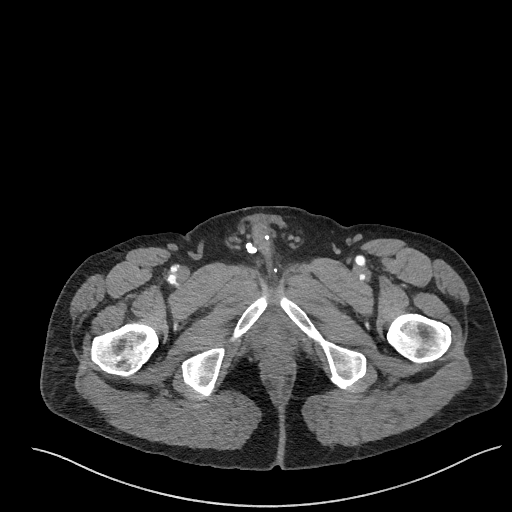
[im 21/254  bone]
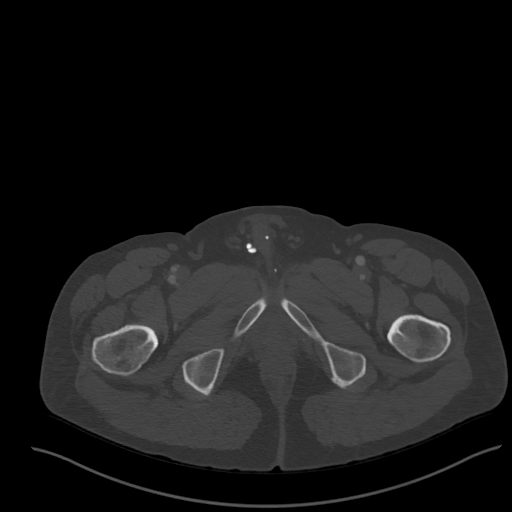
[im 41/254  soft-tissue]
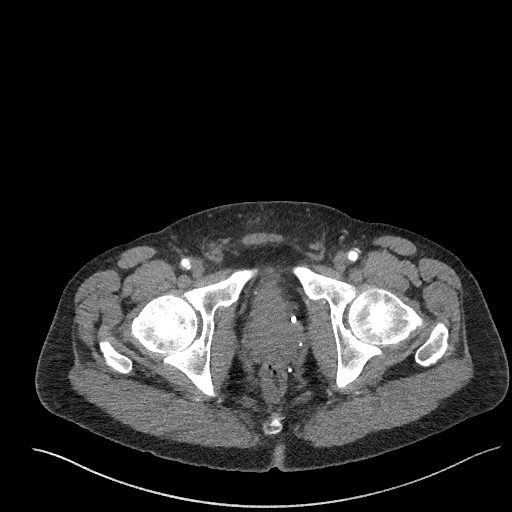
[im 61/254  soft-tissue]
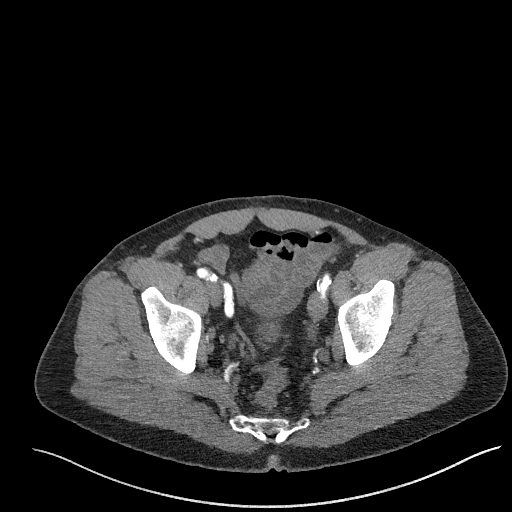
[im 81/254  soft-tissue]
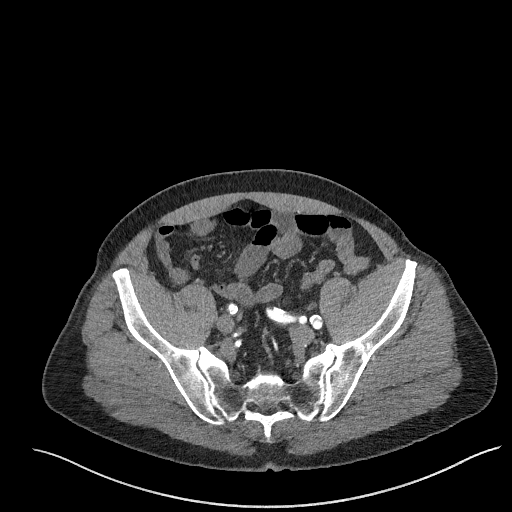
[im 102/254  soft-tissue]
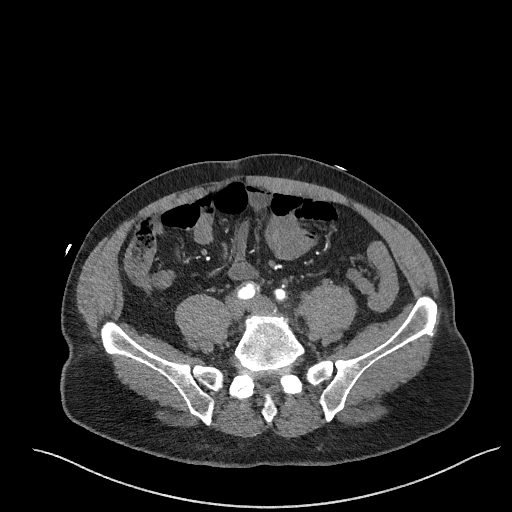
[im 132/254  soft-tissue]
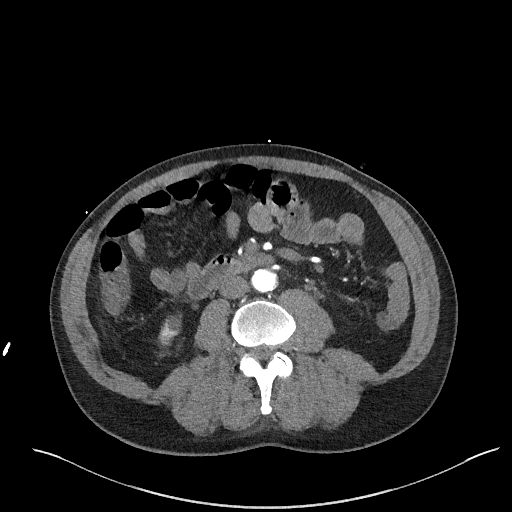
[im 152/254  soft-tissue]
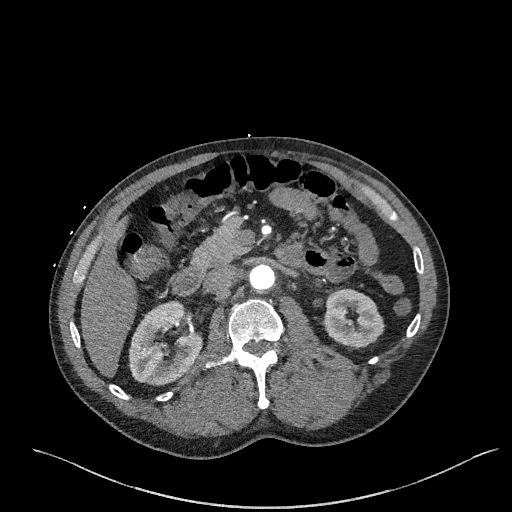
[im 173/254  soft-tissue]
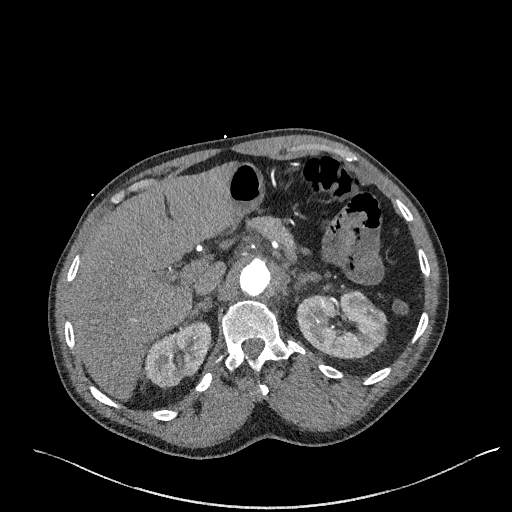
[im 193/254  soft-tissue]
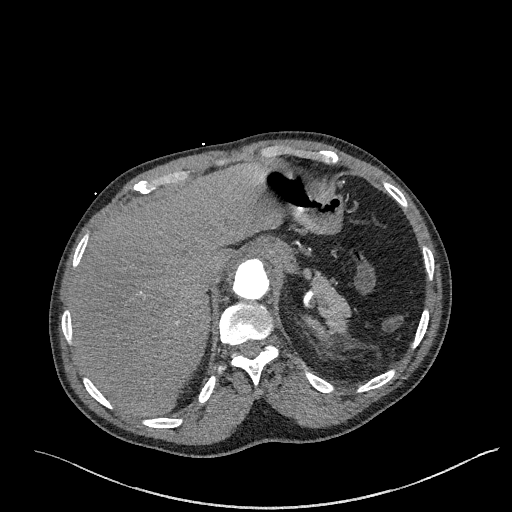
[im 193/254  bone]
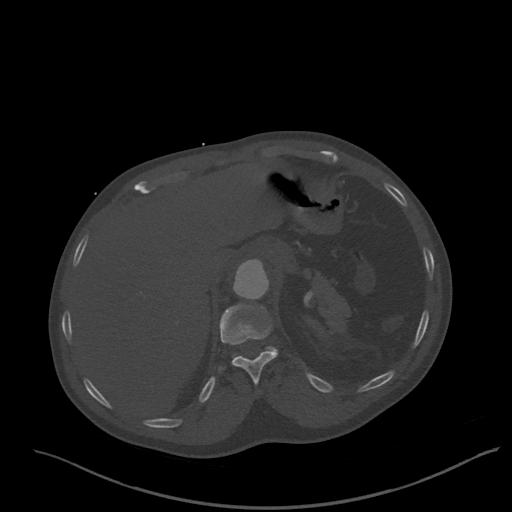
[im 213/254  soft-tissue]
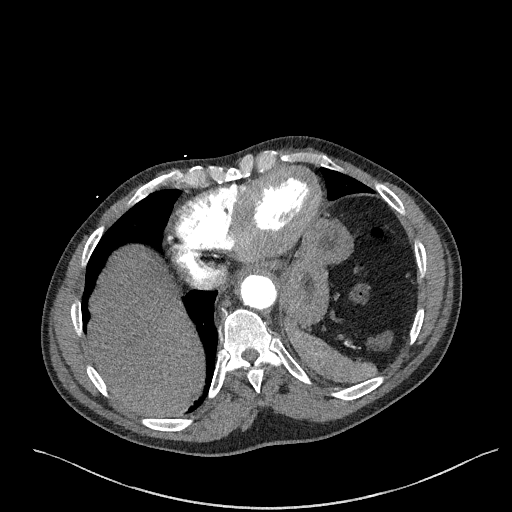
[im 233/254  soft-tissue]
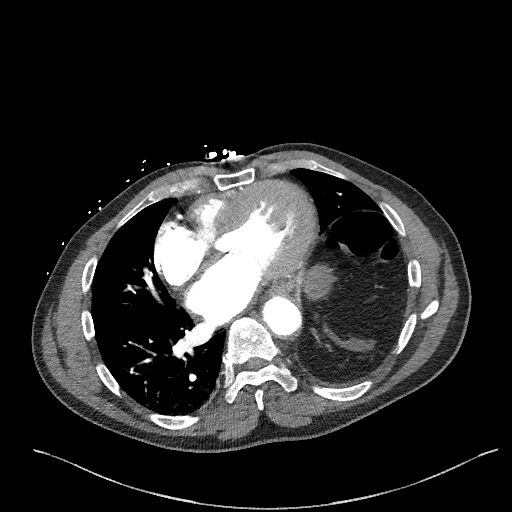

[Series 8: thoracic cta 2mm cor · coronal · 0.79mm/px · 3 of 150 slices shown]
[im 38/150  soft-tissue]
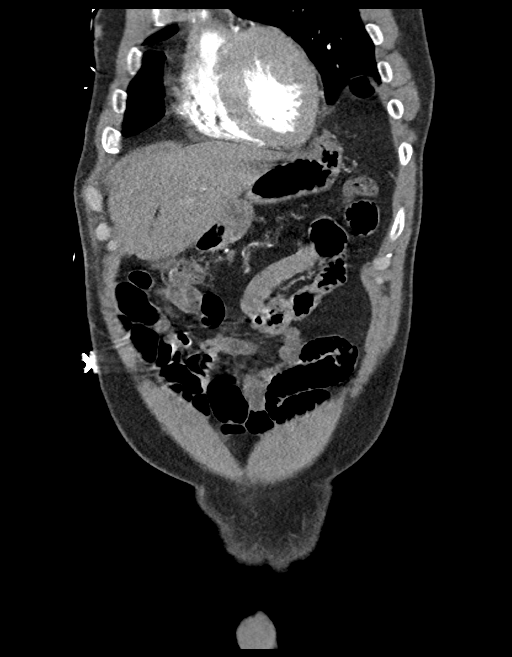
[im 75/150  soft-tissue]
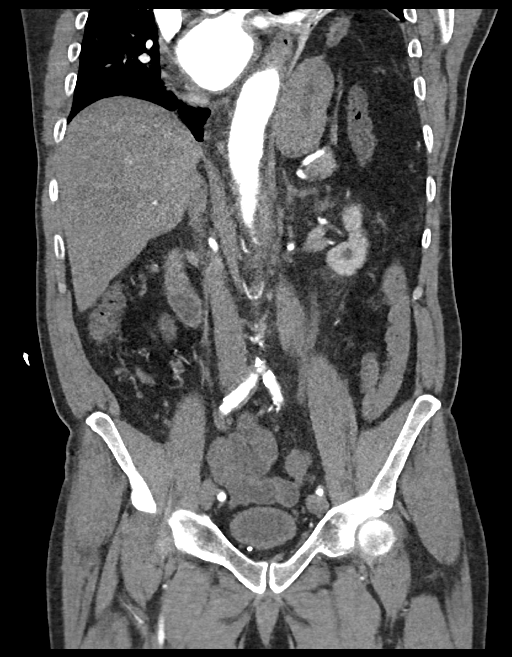
[im 112/150  soft-tissue]
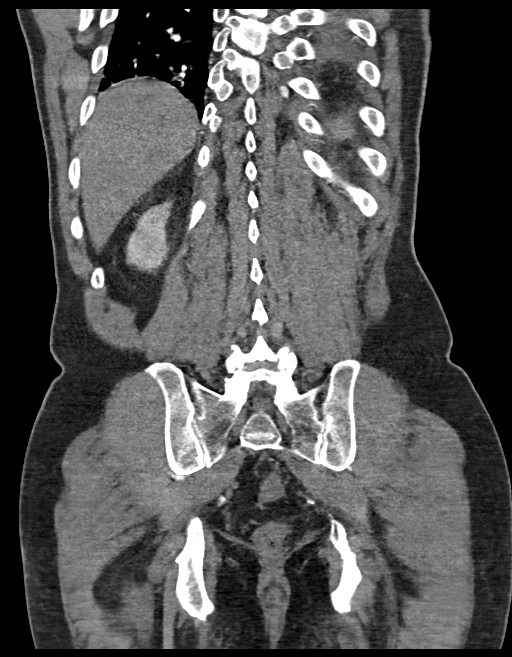

[14 of 46 positions shown; findings below may reference images not displayed]

FINDINGS: VASCULAR

Aorta: The visualized distal descending thoracic segment is ectatic
up to 3.4 cm diameter with mild atheromatous irregularity. Somewhat
eccentric and hyperdense wall thickening extends to the juxtarenal
segment. Aneurysmal dilatation of the supraceliac segment up to
cm diameter. There are adjacent inflammatory/edematous changes in
the retroperitoneal fat. This continues down to the level of the
juxtarenal aorta, 2.4 cm diameter. There is moderate partially
calcified atheromatous plaque in the infrarenal segment with mild
fusiform ectasia up to 2.4 cm, tapering to a diameter of 1.9 cm just
above the bifurcation. No stenosis.

Celiac: Patent without evidence of aneurysm, dissection, vasculitis
or significant stenosis.

SMA: Patent without evidence of aneurysm, dissection, vasculitis or
significant stenosis.

Renals: Single left, widely patent.  Single right, widely patent.

IMA: Short-segment origin stenosis related to aortic wall plaque,
patent distally.

Inflow: Moderate partially calcified plaque without stenosis. Right
common iliac artery ectatic up to 1.6 cm. Tortuous left common iliac
artery.

Proximal Outflow: Atheromatous but patent common femoral arteries
and visualized portions of the SFA and deep femoral artery.

Veins: No obvious venous abnormality within the limitations of this
arterial phase study.

Review of the MIP images confirms the above findings.

NON-VASCULAR

Lower chest: Mild four-chamber cardiac enlargement. Small left
pleural effusion. Large eventration of the left diaphragmatic
leaflet with adjacent atelectasis at the left lung base.

Hepatobiliary: No focal liver abnormality is seen. No gallstones,
gallbladder wall thickening, or biliary dilatation.

Pancreas: Unremarkable. No pancreatic ductal dilatation or
surrounding inflammatory changes.

Spleen: Normal in size without focal abnormality.

Adrenals/Urinary Tract: Normal adrenals. No hydronephrosis. Small
bilateral renal cysts. Urinary bladder incompletely distended,
mildly thick-walled.

Stomach/Bowel: Stomach is nondilated. Small bowel decompressed.
Normal appendix. The colon is nondilated, unremarkable.

Lymphatic: No abdominal or pelvic adenopathy.

Reproductive: Prostatic enlargement

Other: No ascites.  Bilateral pelvic phleboliths.  No free air.

Musculoskeletal: Degenerative disc disease L5-S1. Negative for
fracture or other acute bone abnormality.
IMPRESSION: VASCULAR

1. Hyperdense eccentric mural thickening in the distal descending
thoracic and suprarenal abdominal aorta which is mildly aneurysmal,
with surrounding inflammatory/edematous changes. Considerations
include aortitis versus thrombosed dissection or penetrating
atheromatous ulcer. Consider correlation with inflammatory markers,
and CTA chest to evaluate thoracic aorta and great vessels.
2. No significant visceral or renal artery involvement.
3. Atherosclerotic 1.6 cm ectasia of the right common iliac artery,
without stenosis.

NON-VASCULAR

1. Small left pleural effusion.
2. Large eventration of the left diaphragmatic leaflet, present
since 03/09/2008.

## 2020-05-13 IMAGING — CT CT CTA ABD/PEL W/CM AND/OR W/O CM
2 of 6 series · 14 of 46 positions shown, 16 images · IV contrast (OMNI 350)
Comparison: CTA of the abdomen and pelvis performed 12/29/2017

CLINICAL DATA: Acute onset of intermittent upper back pain. High
blood pressure. Recently admitted for aortic aneurysm or aortitis.

EXAM:
CTA ABDOMEN AND PELVIS wITHOUT AND WITH CONTRAST
TECHNIQUE: Multidetector CT imaging of the abdomen and pelvis was performed
using the standard protocol during bolus administration of
intravenous contrast. Multiplanar reconstructed images and MIPs were
obtained and reviewed to evaluate the vascular anatomy.
CONTRAST:  100mL [id number] IOPAMIDOL ([id number]-WWV) INJECTION 76%

[Series 5: dissection 2mm · axial · 0.72mm/px · z∈[+709,+1141]mm · 11 of 258 slices shown, 13 images]
[im 21/258  soft-tissue]
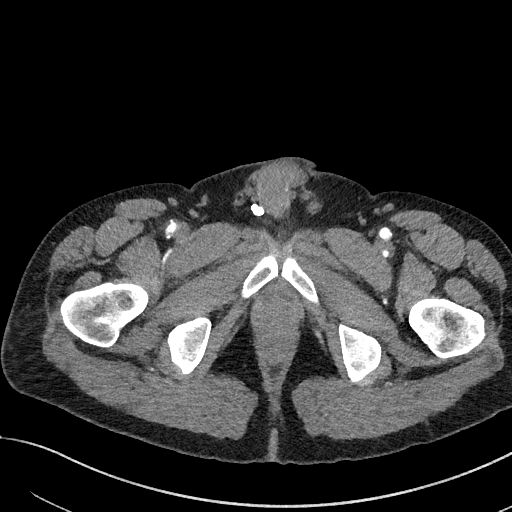
[im 21/258  bone]
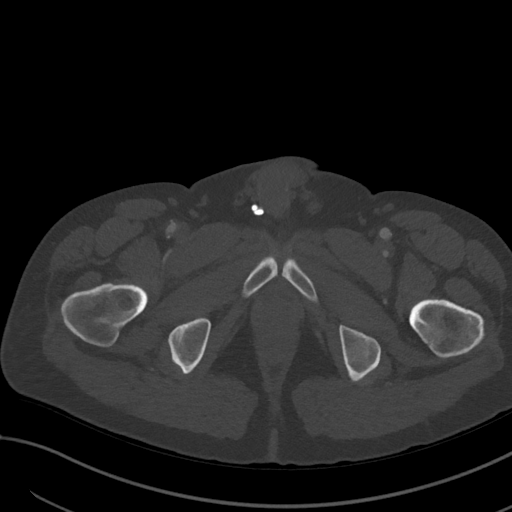
[im 42/258  soft-tissue]
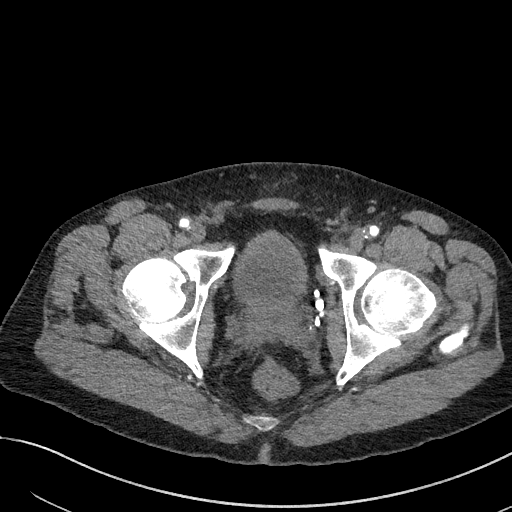
[im 62/258  soft-tissue]
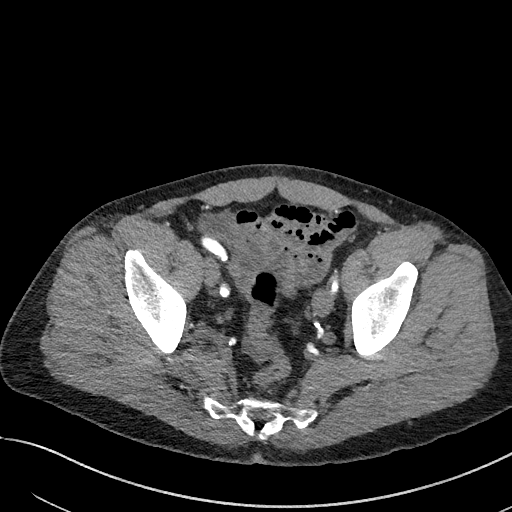
[im 83/258  soft-tissue]
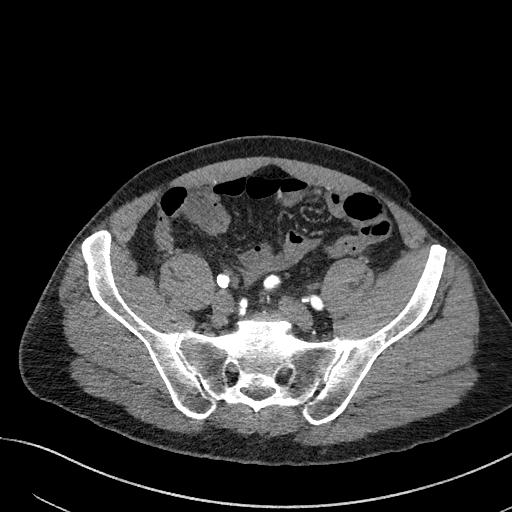
[im 103/258  soft-tissue]
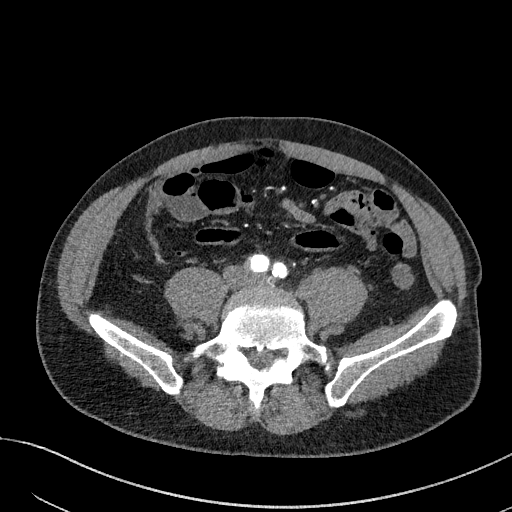
[im 134/258  soft-tissue]
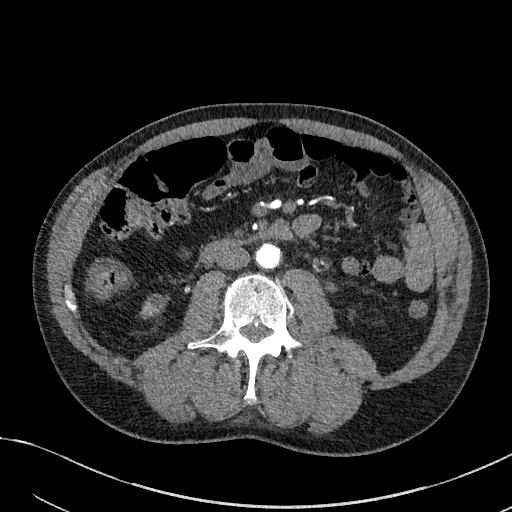
[im 155/258  soft-tissue]
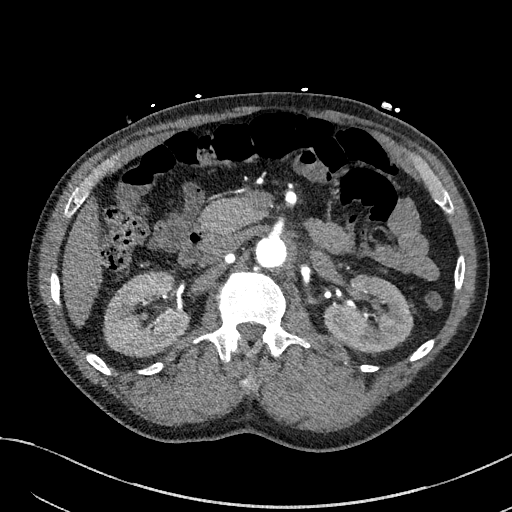
[im 175/258  soft-tissue]
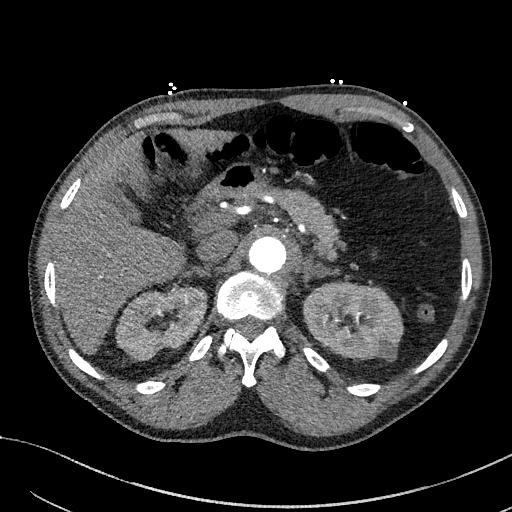
[im 196/258  soft-tissue]
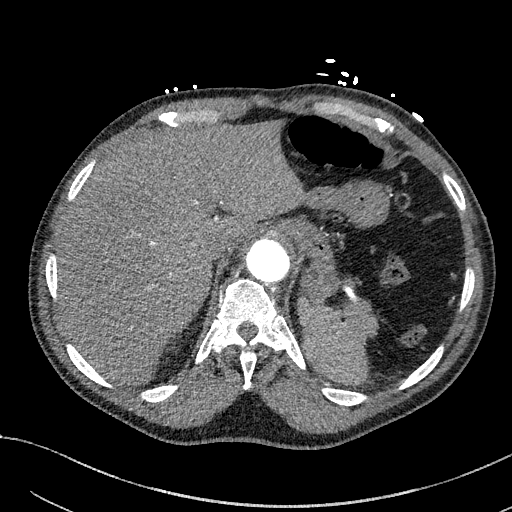
[im 196/258  bone]
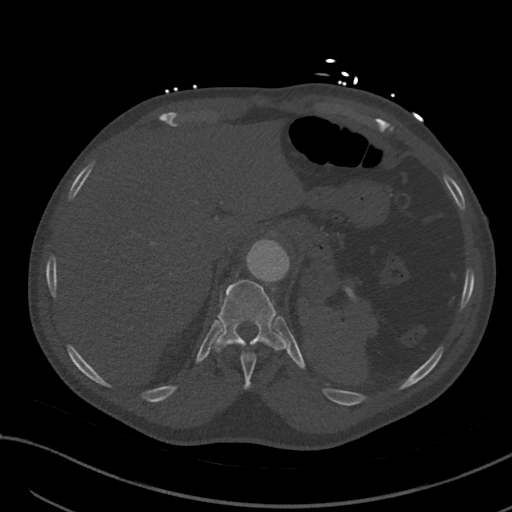
[im 216/258  soft-tissue]
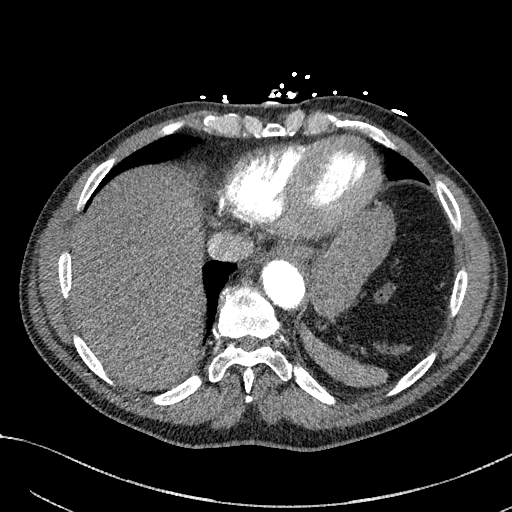
[im 237/258  soft-tissue]
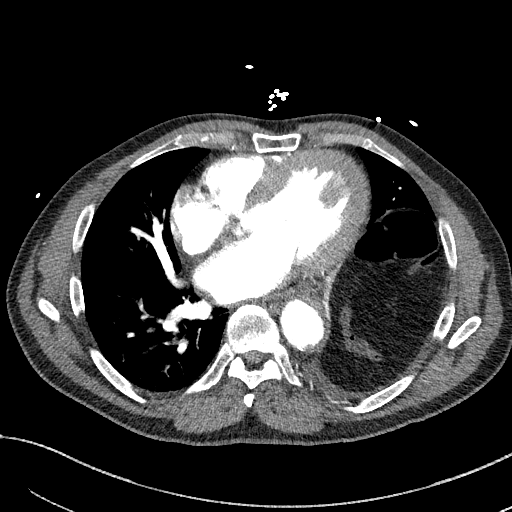

[Series 8: dissection 2mm cor · coronal · 0.69mm/px · 3 of 138 slices shown]
[im 35/138  soft-tissue]
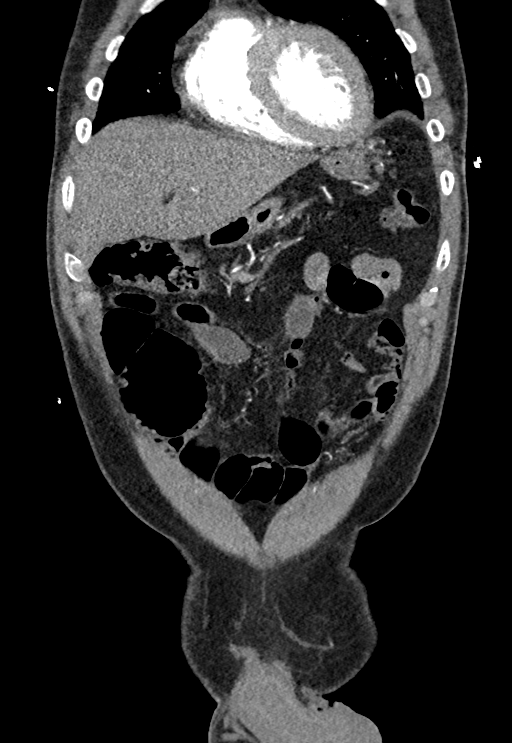
[im 69/138  soft-tissue]
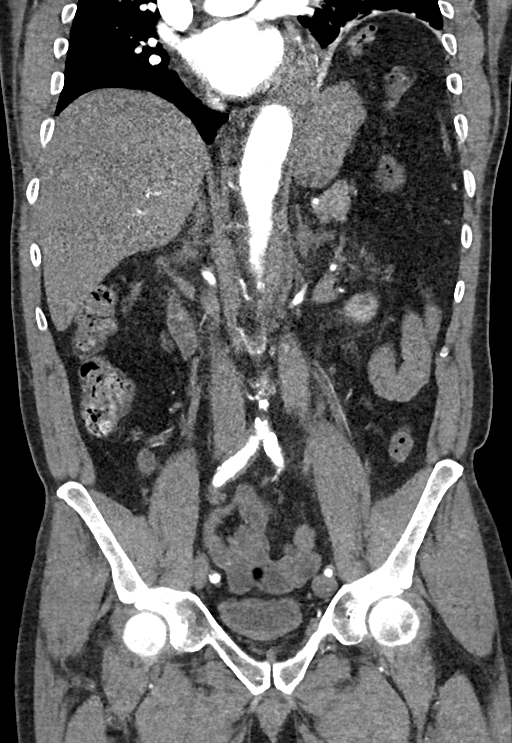
[im 103/138  soft-tissue]
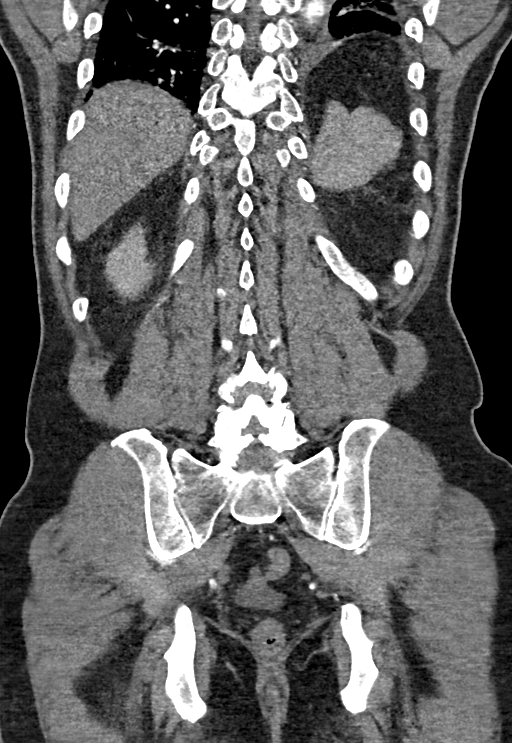

[14 of 46 positions shown; findings below may reference images not displayed]

FINDINGS: VASCULAR

Aorta: There is persistent soft tissue inflammation tracking along
the course of the distal descending thoracic and suprarenal
abdominal aorta, similar in appearance to the prior study. This is
concerning for aortitis. As before, there is focal aneurysmal
dilatation of the proximal abdominal aorta to 3.7 cm in AP
dimension; underlying penetrating atheromatous ulcer cannot be
excluded.

Celiac: The celiac trunk remains patent, though mild luminal
narrowing is noted at the proximal celiac trunk.

SMA: The superior mesenteric artery remains patent.

Renals: The renal arteries appear patent bilaterally.

IMA: The inferior mesenteric artery remains grossly patent, though
mild luminal narrowing is noted at the proximal inferior mesenteric
artery.

Inflow: Scattered calcification is noted along the common, internal
and external iliac arteries bilaterally, with mild luminal narrowing
along the left external iliac artery.

Proximal Outflow: Scattered calcification is noted along the common
femoral arteries and their proximal branches bilaterally.

Veins: Visualized venous structures are grossly unremarkable in
appearance.

Review of the MIP images confirms the above findings.

NON-VASCULAR

Lower chest: A trace left pleural effusion is noted, with associated
atelectasis. The visualized portions of the mediastinum are grossly
unremarkable.

Hepatobiliary: The liver is unremarkable in appearance. The
gallbladder is unremarkable in appearance. The common bile duct
remains normal in caliber.

Pancreas: The pancreas is within normal limits.

Spleen: The spleen is unremarkable in appearance.

Adrenals/Urinary Tract: The adrenal glands are unremarkable in
appearance.

Mild bilateral renal scarring is noted. Scattered bilateral renal
cysts are seen. There is no evidence of hydronephrosis. No renal or
ureteral stones are identified. Mild perinephric stranding is noted
bilaterally.

Stomach/Bowel: The stomach is unremarkable in appearance. The small
bowel is within normal limits. The appendix is normal in caliber,
without evidence of appendicitis. The colon is unremarkable in
appearance.

Lymphatic: No retroperitoneal or pelvic sidewall lymphadenopathy is
seen.

Reproductive: The bladder is relatively decompressed and grossly
unremarkable. The prostate is mildly enlarged, measuring 5.0 cm in
transverse dimension.

Other: No additional soft tissue abnormalities are seen.

Musculoskeletal: No acute osseous abnormalities are identified. The
visualized musculature is unremarkable in appearance.
IMPRESSION: VASCULAR

1. Persistent soft tissue inflammation tracking along the course of
the distal descending thoracic and suprarenal abdominal aorta,
similar in appearance to the prior study. This is concerning for
aortitis.
2. Focal aneurysmal dilatation of the proximal abdominal aorta to
3.7 cm in AP dimension. Underlying penetrating atheromatous ulcer
cannot be excluded.
3. Mild luminal narrowing at the proximal celiac trunk.

NON-VASCULAR

1. Trace left pleural effusion, with associated atelectasis.
2. Mild bilateral renal scarring. Scattered bilateral renal cysts
seen.
3. Mildly enlarged prostate.

## 2020-05-30 ENCOUNTER — Encounter: Payer: 59 | Admitting: Gastroenterology

## 2020-06-06 ENCOUNTER — Other Ambulatory Visit: Payer: Self-pay | Admitting: *Deleted

## 2020-06-06 DIAGNOSIS — I714 Abdominal aortic aneurysm, without rupture, unspecified: Secondary | ICD-10-CM

## 2020-06-25 ENCOUNTER — Other Ambulatory Visit: Payer: Self-pay | Admitting: Internal Medicine

## 2020-06-25 ENCOUNTER — Telehealth: Payer: Self-pay | Admitting: *Deleted

## 2020-06-25 NOTE — Telephone Encounter (Signed)
John,  Can you please review this pt's nuclear stress test from 02-23-2017 and advise on how to proceed. He had an Echo 02-19-17 that was normal, however stress is not. Please advise  (Colon with [facility] in July )  Thanks   Marijean Niemann

## 2020-06-26 ENCOUNTER — Other Ambulatory Visit: Payer: Self-pay | Admitting: Family Medicine

## 2020-06-26 LAB — LIPID PANEL
Cholesterol: 210 mg/dL — ABNORMAL HIGH (ref <200)
HDL: 43 mg/dL (ref >=40)
Non-HDL Cholesterol (Calc): 167 mg/dL (calc) — ABNORMAL HIGH (ref <130)
Total CHOL/HDL Ratio: 4.9 (calc) (ref <5.0)
Triglycerides: 596 mg/dL — ABNORMAL HIGH (ref <150)

## 2020-06-26 LAB — PSA: PSA: 6.32 ng/mL — ABNORMAL HIGH (ref <=4.00)

## 2020-06-26 LAB — EXTRA LAV TOP TUBE

## 2020-07-12 ENCOUNTER — Other Ambulatory Visit: Payer: Self-pay

## 2020-07-12 ENCOUNTER — Ambulatory Visit (AMBULATORY_SURGERY_CENTER): Payer: 59 | Admitting: *Deleted

## 2020-07-12 VITALS — Ht 69.0 in | Wt 195.0 lb

## 2020-07-12 DIAGNOSIS — Z1211 Encounter for screening for malignant neoplasm of colon: Secondary | ICD-10-CM

## 2020-07-12 MED ORDER — NA SULFATE-K SULFATE-MG SULF 17.5-3.13-1.6 GM/177ML PO SOLN: ORAL | 0 refills | Status: DC

## 2020-07-12 NOTE — Progress Notes (Signed)
Patient's pre-visit was done today over the phone with the patient due to COVID-19 pandemic. Name,DOB and address verified. Insurance verified. Patient denies any allergies to Eggs and Soy. Patient denies any problems with anesthesia/sedation. Patient denies taking diet pills or blood thinners. Packet of Prep instructions mailed to patient including a copy of a consent form-pt is aware. Patient understands to call us back with any questions or concerns. Patient is aware of our care-partner policy and [id number] safety protocol.   EMMI education assigned to the patient for the procedure, sent to [facility].   The patient is covid-19 vaccinated.

## 2020-07-21 ENCOUNTER — Other Ambulatory Visit: Payer: Self-pay | Admitting: Family Medicine

## 2020-07-22 ENCOUNTER — Telehealth: Payer: Self-pay | Admitting: Gastroenterology

## 2020-07-22 ENCOUNTER — Telehealth: Payer: Self-pay | Admitting: *Deleted

## 2020-07-22 DIAGNOSIS — Z1211 Encounter for screening for malignant neoplasm of colon: Secondary | ICD-10-CM

## 2020-07-22 MED ORDER — PEG 3350-KCL-NA BICARB-NACL 420 G PO SOLR: 4000.0000 mL | Freq: Once | ORAL | 0 refills | Status: AC

## 2020-07-22 NOTE — Telephone Encounter (Signed)
Returned patient call. Sent prescription for Plenvu. Sent new instructions through secure email [email or web address]  Also sent instructions for Miralax as back-up if insurance doesn't cover Plenvu as per patient request.

## 2020-07-22 NOTE — Telephone Encounter (Signed)
Pt states that copay for Suprep is over $109. He would like something more affordable. His procedure is on Friday.

## 2020-07-22 NOTE — Telephone Encounter (Signed)
Correction from previous telephone encounter. Plenvu did not appear to be covered by patient insurance either. Sent prescription for Golytely with Miralax as back-up

## 2020-07-23 ENCOUNTER — Other Ambulatory Visit: Payer: Self-pay

## 2020-07-23 ENCOUNTER — Encounter: Payer: Self-pay | Admitting: Vascular Surgery

## 2020-07-23 ENCOUNTER — Ambulatory Visit (INDEPENDENT_AMBULATORY_CARE_PROVIDER_SITE_OTHER): Payer: 59 | Admitting: Vascular Surgery

## 2020-07-23 ENCOUNTER — Ambulatory Visit (HOSPITAL_COMMUNITY)
Admission: RE | Admit: 2020-07-23 | Discharge: 2020-07-23 | Disposition: A | Discharge status: Home / Self Care | Payer: 59 | Source: Ambulatory Visit | Attending: Vascular Surgery | Admitting: Vascular Surgery

## 2020-07-23 VITALS — BP 153/102 | HR 82 | Ht 69.0 in | Wt 192.4 lb

## 2020-07-23 DIAGNOSIS — I714 Abdominal aortic aneurysm, without rupture, unspecified: Secondary | ICD-10-CM

## 2020-07-23 NOTE — Progress Notes (Signed)
Patient name: Benjamin Hines MRN: [id number] DOB: May 27, 1960 Sex: male  REASON FOR VISIT: 1 year follow-up for AAA  HPI: Benjamin Hines is a 60 y.o. male who presents for one-year follow-up after previously being evaluated in the hospital for stranding in his supra visceral aorta.  At the time he presented with abdominal pain and a CT was obtained in the ED that showed some stranding around the aorta.  Ultimately underwent a negative infectious work-up including negative blood cultures.  He got a repeat CT scan at 1 month follow-up after hospital discharge in 01/2018 that showed no further stranding.  He was last seen 1 year ago and maximal proximal aortic diameter was 4 cm.  He reports no abdominal or back pain.  He has had no changes to his health.  Has not smoked in 10 years.  Past Medical History:  Diagnosis Date   AAA (abdominal aortic aneurysm) without rupture ([place]) 12/27/2017   Allergy    Hyperlipidemia    Hypertension    Sleep apnea     Past Surgical History:  Procedure Laterality Date   COLONOSCOPY  03/14/2008   FOOT SURGERY     left foot    Family History  Problem Relation Age of Onset   Dementia Mother    Hypertension Mother    CAD Father    Colon cancer Neg Hx    Colon polyps Neg Hx    Esophageal cancer Neg Hx    Rectal cancer Neg Hx    Stomach cancer Neg Hx     SOCIAL HISTORY: Social History   Tobacco Use   Smoking status: Former    Packs/day: 1.00    Years: 20.00    Pack years: 20.00    Types: Cigarettes    Quit date: 2012    Years since quitting: 10.5   Smokeless tobacco: Never  Substance Use Topics   Alcohol use: Not Currently    Alcohol/week: 14.0 standard drinks    Types: 14 Cans of beer per week    No Known Allergies  Current Outpatient Medications  Medication Sig Dispense Refill   amLODipine (NORVASC) 10 MG tablet Take 1 tablet by mouth once daily 90 tablet 0   atorvastatin (LIPITOR) 40 MG tablet Take 1 tablet (40 mg total) by mouth  daily. 90 tablet 1   cetirizine (ZYRTEC) 10 MG tablet SMARTSIG:1 Tablet(s) By Mouth Every Evening PRN     fluticasone (FLONASE) 50 MCG/ACT nasal spray SMARTSIG:2 Spray(s) Both Nares Every Evening     Na Sulfate-K Sulfate-Mg Sulf 17.5-3.13-1.6 GM/177ML SOLN Suprep (no substitutions)-TAKE AS DIRECTED. 354 mL 0   tamsulosin (FLOMAX) 0.4 MG CAPS capsule Take 0.4 mg by mouth daily.     thiamine (VITAMIN B-1) 100 MG tablet Take 100 mg by mouth daily.     valsartan-hydrochlorothiazide (DIOVAN-HCT) 80-12.5 MG tablet Take 1 tablet by mouth every morning.     No current facility-administered medications for this visit.    REVIEW OF SYSTEMS:  [X]  denotes positive finding, [ ]  denotes negative finding Cardiac  Comments:  Chest pain or chest pressure:    Shortness of breath upon exertion:    Short of breath when lying flat:    Irregular heart rhythm:        Vascular    Pain in calf, thigh, or hip brought on by ambulation:    Pain in feet at night that wakes you up from your sleep:     Blood clot in your veins:  Leg swelling:         Pulmonary    Oxygen at home:    Productive cough:     Wheezing:         Neurologic    Sudden weakness in arms or legs:     Sudden numbness in arms or legs:     Sudden onset of difficulty speaking or slurred speech:    Temporary loss of vision in one eye:     Problems with dizziness:         Gastrointestinal    Blood in stool:     Vomited blood:         Genitourinary    Burning when urinating:     Blood in urine:        Psychiatric    Major depression:         Hematologic    Bleeding problems:    Problems with blood clotting too easily:        Skin    Rashes or ulcers:        Constitutional    Fever or chills:      PHYSICAL EXAM: Vitals:   07/23/20 0839  BP: (!) 153/102  Pulse: 82  SpO2: 97%  Weight: 192 lb 6.4 oz (87.3 kg)  Height: 5\' 9"  (1.753 m)    GENERAL: The patient is a well-nourished male, in no acute distress. The vital  signs are documented above. CARDIAC: There is a regular rate and rhythm.  VASCULAR:  2+ femoral pulse palpable bilateral groins 2+ dorsalis pedis pulses palpable bilateral lower extremities PULMONARY: No respiratory distress. ABDOMEN: Soft and non-tender.   MUSCULOSKELETAL: There are no major deformities or cyanosis. NEUROLOGIC: No focal weakness or paresthesias are detected.   DATA:   CTA abdomen pelvis from 01/21/2018 with no overt inflammation around the aorta and the suprarenal aorta measured maximal diameter of 3.9 cm   AAA duplex today shows maximal proximal aortic diameter of 4.6 cm and increased from 4.0 cm approximately 6 months ago  Assessment/Plan:  60 year old male presents for 1 year follow-up for ongoing surveillance of his suprarenal aorta previously measuring 4.0 cm.  He was initially evaluated for aortitis in 2020 and I was initially concerned with possible infection.  Ultimately his infectious work-up was negative and repeat scan with CT in 2020 showed resolution of the stranding.  We are following this area for aneurysmal degeneration.  This has increased in size in the proximal aorta from 4.0 to 4.6 cm today.  He continues to have no symptoms.  Discussed I will see him again in 6 months with a AAA duplex here in the office.   [name], MD [facility] Office: [phone]

## 2020-07-25 ENCOUNTER — Other Ambulatory Visit: Payer: Self-pay

## 2020-07-25 ENCOUNTER — Telehealth: Payer: Self-pay

## 2020-07-25 ENCOUNTER — Telehealth: Payer: Self-pay | Admitting: Gastroenterology

## 2020-07-25 DIAGNOSIS — I714 Abdominal aortic aneurysm, without rupture, unspecified: Secondary | ICD-10-CM

## 2020-07-25 NOTE — Telephone Encounter (Signed)
Patient called with prep questions. We went over the amount and start times with his prep as well as when and what to eat today and tomorrow. He now understands all aspects of his colonoscopy instructions and has no further questions.

## 2020-07-25 NOTE — Telephone Encounter (Signed)
Patient has questions about procedure instructions. He is confused about prep. Please call him.

## 2020-07-26 ENCOUNTER — Encounter: Payer: Self-pay | Admitting: Gastroenterology

## 2020-07-26 ENCOUNTER — Other Ambulatory Visit: Payer: Self-pay

## 2020-07-26 ENCOUNTER — Ambulatory Visit (AMBULATORY_SURGERY_CENTER): Payer: 59 | Admitting: Gastroenterology

## 2020-07-26 VITALS — BP 117/93 | HR 77 | Temp 98.6°F | Resp 17 | Ht 69.0 in | Wt 195.0 lb

## 2020-07-26 DIAGNOSIS — Z1211 Encounter for screening for malignant neoplasm of colon: Secondary | ICD-10-CM | POA: Diagnosis present

## 2020-07-26 DIAGNOSIS — D120 Benign neoplasm of cecum: Secondary | ICD-10-CM

## 2020-07-26 DIAGNOSIS — D123 Benign neoplasm of transverse colon: Secondary | ICD-10-CM

## 2020-07-26 HISTORY — PX: COLONOSCOPY: SHX174

## 2020-07-26 MED ORDER — SODIUM CHLORIDE 0.9 % IV SOLN: 500.0000 mL | Freq: Once | INTRAVENOUS | Status: DC

## 2020-07-26 NOTE — Progress Notes (Signed)
PT taken to PACU. Monitors in place. VSS. Report given to RN. 

## 2020-07-26 NOTE — Patient Instructions (Signed)
Read all of the handouts given to you by your recovery room nurse. ? ?YOU HAD AN ENDOSCOPIC PROCEDURE TODAY AT THE [facility]:   Refer to the procedure report that was given to you for any specific questions about what was found during the examination.  If the procedure report does not answer your questions, please call your gastroenterologist to clarify.  If you requested that your care partner not be given the details of your procedure findings, then the procedure report has been included in a sealed envelope for you to review at your convenience later. ? ?YOU SHOULD EXPECT: Some feelings of bloating in the abdomen. Passage of more gas than usual.  Walking can help get rid of the air that was put into your GI tract during the procedure and reduce the bloating. If you had a lower endoscopy (such as a colonoscopy or flexible sigmoidoscopy) you may notice spotting of blood in your stool or on the toilet paper. If you underwent a bowel prep for your procedure, you may not have a normal bowel movement for a few days. ? ?Please Note:  You might notice some irritation and congestion in your nose or some drainage.  This is from the oxygen used during your procedure.  There is no need for concern and it should clear up in a day or so. ? ?SYMPTOMS TO REPORT IMMEDIATELY: ? ?Following lower endoscopy (colonoscopy or flexible sigmoidoscopy): ? Excessive amounts of blood in the stool ? Significant tenderness or worsening of abdominal pains ? Swelling of the abdomen that is new, acute ? Fever of 100?F or higher ? ?  ?For urgent or emergent issues, a gastroenterologist can be reached at any hour by calling [phone]. ?Do not use MyChart messaging for urgent concerns.  ? ? ?DIET:  We do recommend a small meal at first, but then you may proceed to your regular diet.  Drink plenty of fluids but you should avoid alcoholic beverages for 24 hours. ? ?ACTIVITY:  You should plan to take it easy for the rest of today  and you should NOT DRIVE or use heavy machinery until tomorrow (because of the sedation medicines used during the test).   ? ?FOLLOW UP: ?Our staff will call the number listed on your records 48-72 hours following your procedure to check on you and address any questions or concerns that you may have regarding the information given to you following your procedure. If we do not reach you, we will leave a message.  We will attempt to reach you two times.  During this call, we will ask if you have developed any symptoms of COVID 19. If you develop any symptoms (ie: fever, flu-like symptoms, shortness of breath, cough etc.) before then, please call [phone].  If you test positive for Covid 19 in the 2 weeks post procedure, please call and report this information to us.   ? ?If any biopsies were taken you will be contacted by phone or by letter within the next 1-3 weeks.  Please call us at [phone] if you have not heard about the biopsies in 3 weeks.  ? ? ?SIGNATURES/CONFIDENTIALITY: ?You and/or your care partner have signed paperwork which will be entered into your electronic medical record.  These signatures attest to the fact that that the information above on your After Visit Summary has been reviewed and is understood.  Full responsibility of the confidentiality of this discharge information lies with you and/or your care-partner.  ?

## 2020-07-26 NOTE — Op Note (Signed)
[place] Patient Name: Benjamin Hines Procedure Date: 07/26/2020 1:59 PM MRN: [id number] Endoscopist: Ladene Artist , MD Age: 60 Referring MD:  Date of Birth: 02/13/1960 Gender: Male Account #: [id number] Procedure:                Colonoscopy Indications:              Screening for colorectal malignant neoplasm Medicines:                Monitored Anesthesia Care Procedure:                Pre-Anesthesia Assessment:                           - Prior to the procedure, a History and Physical                            was performed, and patient medications and                            allergies were reviewed. The patient's tolerance of                            previous anesthesia was also reviewed. The risks                            and benefits of the procedure and the sedation                            options and risks were discussed with the patient.                            All questions were answered, and informed consent                            was obtained. Prior Anticoagulants: The patient has                            taken no previous anticoagulant or antiplatelet                            agents. ASA Grade Assessment: II - A patient with                            mild systemic disease. After reviewing the risks                            and benefits, the patient was deemed in                            satisfactory condition to undergo the procedure.                           After obtaining informed consent, the colonoscope  was passed under direct vision. Throughout the                            procedure, the patient's blood pressure, pulse, and                            oxygen saturations were monitored continuously. The                            CF [id number] #[id number] was introduced through the anus                            and advanced to the the cecum, identified by                            appendiceal orifice  and ileocecal valve. The                            ileocecal valve, appendiceal orifice, and rectum                            were photographed. The quality of the bowel                            preparation was good. The colonoscopy was performed                            without difficulty. The patient tolerated the                            procedure well. Scope In: 2:08:08 PM Scope Out: 2:25:16 PM Scope Withdrawal Time: 0 hours 15 minutes 24 seconds  Total Procedure Duration: 0 hours 17 minutes 8 seconds  Findings:                 The perianal and digital rectal examinations were                            normal.                           Three sessile polyps were found in the transverse                            colon (2) and cecum (1). The polyps were 6 to 8 mm                            in size. These polyps were removed with a cold                            snare. Resection and retrieval were complete.                           Internal hemorrhoids were found during  retroflexion. The hemorrhoids were moderate and                            Grade I (internal hemorrhoids that do not prolapse).                           The exam was otherwise without abnormality on                            direct and retroflexion views. Complications:            No immediate complications. Estimated blood loss:                            None. Estimated Blood Loss:     Estimated blood loss: none. Impression:               - Three 6 to 8 mm polyps in the transverse colon                            and in the cecum, removed with a cold snare.                            Resected and retrieved.                           - Internal hemorrhoids.                           - The examination was otherwise normal on direct                            and retroflexion views. Recommendation:           - Repeat colonoscopy after studies are complete for                             surveillance based on pathology results.                           - Patient has a contact number available for                            emergencies. The signs and symptoms of potential                            delayed complications were discussed with the                            patient. Return to normal activities tomorrow.                            Written discharge instructions were provided to the                            patient.                           -  Resume previous diet.                           - Continue present medications.                           - Await pathology results. Ladene Artist, MD 07/26/2020 2:31:27 PM This report has been signed electronically.

## 2020-07-26 NOTE — Progress Notes (Signed)
Pt's states no medical or surgical changes since previsit or office visit.  [facility] [place] 

## 2020-07-26 NOTE — Progress Notes (Signed)
Called to room to assist during endoscopic procedure.  Patient ID and intended procedure confirmed with present staff. Received instructions for my participation in the procedure from the performing physician.  

## 2020-07-30 ENCOUNTER — Telehealth: Payer: Self-pay | Admitting: *Deleted

## 2020-07-30 NOTE — Telephone Encounter (Signed)
  Follow up Call-  Call back number 07/26/2020  Post procedure Call Back phone  # [phone]  Permission to leave phone message Yes  Some recent data might be hidden     Patient questions:  Do you have a fever, pain , or abdominal swelling? No. Pain Score  0 *  Have you tolerated food without any problems? Yes.    Have you been able to return to your normal activities? Yes.    Do you have any questions about your discharge instructions: Diet   No. Medications  No. Follow up visit  No.  Do you have questions or concerns about your Care? No.  Actions: * If pain score is 4 or above: No action needed, pain <4.  Have you developed a fever since your procedure? no  2.   Have you had an respiratory symptoms (SOB or cough) since your procedure? no  3.   Have you tested positive for COVID 19 since your procedure no  4.   Have you had any family members/close contacts diagnosed with the COVID 19 since your procedure?  no   If yes to any of these questions please route to Joylene John, RN and Joella Prince, RN

## 2020-08-07 ENCOUNTER — Encounter: Payer: Self-pay | Admitting: Gastroenterology

## 2020-08-26 HISTORY — PX: COLONOSCOPY: SHX174

## 2020-09-06 ENCOUNTER — Telehealth: Payer: Self-pay | Admitting: Family Medicine

## 2020-09-06 MED ORDER — VALSARTAN-HYDROCHLOROTHIAZIDE 80-12.5 MG PO TABS: 1.0000 | ORAL_TABLET | Freq: Every morning | ORAL | 0 refills | Status: DC

## 2020-09-06 MED ORDER — AMLODIPINE BESYLATE 10 MG PO TABS: 10.0000 mg | ORAL_TABLET | Freq: Every day | ORAL | 0 refills | Status: DC

## 2020-09-06 MED ORDER — ATORVASTATIN CALCIUM 40 MG PO TABS: 40.0000 mg | ORAL_TABLET | Freq: Every day | ORAL | 0 refills | Status: DC

## 2020-09-06 NOTE — Telephone Encounter (Signed)
Spoke with the patient. He is aware his prescriptions have been sent in and that he needs to schedule a follow up. Pt ll call back to schedule.

## 2020-09-06 NOTE — Telephone Encounter (Signed)
Patient called needing refills on all four of his blood pressure medications    Good callback number [phone]     Please send to  [facility] ([facility] [place] - [address] Phone:  [phone]  Fax:  [phone]

## 2020-09-06 NOTE — Telephone Encounter (Signed)
Please advise. Some of these are listed under historical providers.

## 2020-09-17 NOTE — Progress Notes (Signed)
GU Location of Tumor / Histology: Adenocarcinoma of the Prostate  If Prostate Cancer, Gleason Score is ( 3+ 4), PSA (7.58), and Prostate volume (31.86g)  Benjamin Hines presented in August to [facility] with Dr Claudia Desanctis with elevated PSA.  He reports that he has been experiencing some perineal discomfort for the past 4-5 months which he thinks has been related to his prostate.  Biopsies revealed: 08/12/2020      Past/Anticipated interventions by urology, if any: Prostate needle biopsy 08/12/2020  Past/Anticipated interventions by medical oncology, if any:   Weight changes, if any: No  IPSS Score: 1 SHIM Score: 21  Bowel/Bladder complaints, if any:  No  Nausea/Vomiting, if any: No  Pain issues, if any:  0  SAFETY ISSUES: Prior radiation? No Pacemaker/ICD? No Possible current pregnancy? Male Is the patient on methotrexate? No  Current Complaints / other details:  No

## 2020-09-19 ENCOUNTER — Ambulatory Visit
Admission: RE | Admit: 2020-09-19 | Discharge: 2020-09-19 | Disposition: A | Discharge status: Home / Self Care | Payer: 59 | Source: Ambulatory Visit | Attending: Radiation Oncology | Admitting: Radiation Oncology

## 2020-09-19 ENCOUNTER — Other Ambulatory Visit: Payer: Self-pay

## 2020-09-19 ENCOUNTER — Encounter: Payer: Self-pay | Admitting: Radiation Oncology

## 2020-09-19 DIAGNOSIS — C61 Malignant neoplasm of prostate: Secondary | ICD-10-CM | POA: Insufficient documentation

## 2020-09-19 NOTE — Addendum Note (Signed)
Encounter addended by: Freeman Caldron, PA-C on: 09/19/2020 1:23 PM  Actions taken: SmartForm saved, Pend clinical note, Level of Service modified

## 2020-09-19 NOTE — Progress Notes (Signed)
Radiation Oncology         [phone] ________________________________  Initial Outpatient Consultation- Conducted via telephone due to current COVID-19 concerns for limiting patient exposure  Name: ROI HJORT MRN: [id number]  Date: 09/19/2020  DOB: 04/30/1960  [id number], Alinda Sierras, MD  Robley Fries, MD   REFERRING PHYSICIAN: Robley Fries, MD  DIAGNOSIS: 60 y.o. gentleman with Stage T1c adenocarcinoma of the prostate with Gleason score of 3+4, and PSA of 7.58.    ICD-10-CM   1. Malignant neoplasm of prostate [facility])  [facility] referral to Social Work      HISTORY OF PRESENT ILLNESS: JACKSEN CECERE is a 59 y.o. male with a diagnosis of prostate cancer. He was noted to have an elevated PSA of 5.82 and 6.32 on repeat, by his primary care physician, Dr. Elease Hashimoto.  Accordingly, he was referred for evaluation in urology by Dr. Claudia Desanctis on 07/17/20,  digital rectal examination was performed at that time revealing no discrete nodularity.  The patient proceeded to transrectal ultrasound with 12 biopsies of the prostate on 08/12/20.  The prostate volume measured 31.86 cc.  Out of 12 core biopsies, 1 was positive.  The maximum Gleason score was 3+4, and this was seen in the right mid.  The patient reviewed the biopsy results with his urologist and he has kindly been referred today for discussion of potential radiation treatment options.   PREVIOUS RADIATION THERAPY: No  PAST MEDICAL HISTORY:  Past Medical History:  Diagnosis Date   AAA (abdominal aortic aneurysm) without rupture ([place]) 12/27/2017   Allergy    Hyperlipidemia    Hypertension    Sleep apnea       PAST SURGICAL HISTORY: Past Surgical History:  Procedure Laterality Date   COLONOSCOPY  08/26/2020   FOOT SURGERY     left foot    FAMILY HISTORY:  Family History  Problem Relation Age of Onset   Dementia Mother    Hypertension Mother    CAD Father    Colon cancer Neg Hx    Colon polyps Neg Hx     Esophageal cancer Neg Hx    Rectal cancer Neg Hx    Stomach cancer Neg Hx     SOCIAL HISTORY:  Social History   Socioeconomic History   Marital status: Married    Spouse name: Not on file   Number of children: Not on file   Years of education: Not on file   Highest education level: Not on file  Occupational History   Not on file  Tobacco Use   Smoking status: Former    Packs/day: 1.00    Years: 20.00    Pack years: 20.00    Types: Cigarettes    Quit date: 2012    Years since quitting: 10.6   Smokeless tobacco: Never  Vaping Use   Vaping Use: Former  Substance and Sexual Activity   Alcohol use: Not Currently    Alcohol/week: 14.0 standard drinks    Types: 14 Cans of beer per week   Drug use: No   Sexual activity: Not on file  Other Topics Concern   Not on file  Social History Narrative   Not on file   Social Determinants of Health   Financial Resource Strain: Not on file  Food Insecurity: Not on file  Transportation Needs: Not on file  Physical Activity: Not on file  Stress: Not on file  Social Connections: Not on file  Intimate Partner Violence: Not on file  ALLERGIES: Patient has no allergy information on record.  MEDICATIONS:  Current Outpatient Medications  Medication Sig Dispense Refill   amLODipine (NORVASC) 10 MG tablet Take 1 tablet (10 mg total) by mouth daily. 30 tablet 0   atorvastatin (LIPITOR) 40 MG tablet Take 1 tablet (40 mg total) by mouth daily. 30 tablet 0   cetirizine (ZYRTEC) 10 MG tablet SMARTSIG:1 Tablet(s) By Mouth Every Evening PRN     fluticasone (FLONASE) 50 MCG/ACT nasal spray SMARTSIG:2 Spray(s) Both Nares Every Evening     tamsulosin (FLOMAX) 0.4 MG CAPS capsule Take 0.4 mg by mouth daily.     thiamine (VITAMIN B-1) 100 MG tablet Take 100 mg by mouth daily.     valsartan-hydrochlorothiazide (DIOVAN-HCT) 80-12.5 MG tablet Take 1 tablet by mouth every morning. 30 tablet 0   Current Facility-Administered Medications  Medication  Dose Route Frequency Provider Last Rate Last Admin   0.9 %  sodium chloride infusion  500 mL Intravenous Once Ladene Artist, MD        REVIEW OF SYSTEMS:  On review of systems, the patient reports that he is doing well overall. He denies any chest pain, shortness of breath, cough, fevers, chills, night sweats, unintended weight changes. He denies any bowel disturbances, and denies abdominal pain, nausea or vomiting. He denies any new musculoskeletal or joint aches or pains. His IPSS was 1, indicating mild urinary symptoms. His SHIM was 21, indicating he does not have erectile dysfunction. A complete review of systems is obtained and is otherwise negative.    PHYSICAL EXAM:  Wt Readings from Last 3 Encounters:  07/26/20 195 lb (88.5 kg)  07/23/20 192 lb 6.4 oz (87.3 kg)  07/12/20 195 lb (88.5 kg)   Temp Readings from Last 3 Encounters:  07/26/20 98.6 F (37 C) (Temporal)  10/25/19 98.3 F (36.8 C) (Oral)  07/11/19 97.7 F (36.5 C) (Temporal)   BP Readings from Last 3 Encounters:  07/26/20 (!) 117/93  07/23/20 (!) 153/102  10/25/19 (!) 220/100   Pulse Readings from Last 3 Encounters:  07/26/20 77  07/23/20 82  10/25/19 100   Pain Assessment Pain Score: 0-No pain/10  Unable to assess due to telephone consult visit format.    KPS = 100  100 - Normal; no complaints; no evidence of disease. 90   - Able to carry on normal activity; minor signs or symptoms of disease. 80   - Normal activity with effort; some signs or symptoms of disease. 59   - Cares for self; unable to carry on normal activity or to do active work. 60   - Requires occasional assistance, but is able to care for most of his personal needs. 50   - Requires considerable assistance and frequent medical care. 12   - Disabled; requires special care and assistance. 85   - Severely disabled; hospital admission is indicated although death not imminent. 65   - Very sick; hospital admission necessary; active supportive  treatment necessary. 10   - Moribund; fatal processes progressing rapidly. 0     - Dead  Karnofsky DA, Abelmann [place], Craver LS and [facility] [phone]) The use of the nitrogen mustards in the palliative treatment of carcinoma: with particular reference to bronchogenic carcinoma Cancer 1 634-56  LABORATORY DATA:  Lab Results  Component Value Date   WBC 8.9 03/06/2020   HGB 14.6 03/06/2020   HCT 43.4 03/06/2020   MCV 87.5 03/06/2020   PLT 252 03/06/2020   Lab Results  Component Value  Date   NA 141 03/06/2020   K 4.3 03/06/2020   CL 105 03/06/2020   CO2 24 03/06/2020   Lab Results  Component Value Date   ALT 25 03/06/2020   AST 23 03/06/2020   ALKPHOS 81 11/04/2018   BILITOT 0.4 03/06/2020     RADIOGRAPHY: No results found.    IMPRESSION/PLAN:  This visit was conducted via telephone to spare the patient unnecessary potential exposure in the healthcare setting during the current COVID-19 pandemic.   1. 60 y.o. gentleman with Stage T1c adenocarcinoma of the prostate with Gleason Score of 3+4, and PSA of 7.58. We discussed the patient's workup and outlined the nature of prostate cancer in this setting. The patient's T stage, Gleason's score, and PSA put him into the favorable intermediate risk group. Accordingly, he is eligible for a variety of potential treatment options including brachytherapy, 5.5 weeks of external radiation or prostatectomy. We discussed the available radiation techniques, and focused on the details and logistics of delivery. We discussed and outlined the risks, benefits, short and long-term effects associated with radiotherapy and compared and contrasted these with prostatectomy. We discussed the role of SpaceOAR gel in reducing the rectal toxicity associated with radiotherapy.  He appears to have a good understanding of his disease and our treatment recommendations which are of curative intent.  He was encouraged to ask questions that were answered to his stated  satisfaction.  At the conclusion of our conversation, the patient is interested in moving forward with brachytherapy and use of SpaceOAR gel to reduce rectal toxicity from radiotherapy.  We will share our discussion with Dr. Dr. pace and move forward with scheduling his [facility] planning appointment in the near future.  The patient will be contacted by Romie Jumper in our office who will be working closely with him to coordinate OR scheduling and pre and post procedure appointments.  We will contact the pharmaceutical rep to ensure that [place] is available at the time of procedure.  We enjoyed meeting him today and look forward to continuing to participate in his care.  Given current concerns for patient exposure during the COVID-19 pandemic, this encounter was conducted via telephone. The patient was notified in advance and was offered a [place] meeting to allow for face to face communication but unfortunately reported that he did not have the appropriate resources/technology to support such a visit and instead preferred to proceed with telephone consult. The patient has given verbal consent for this type of encounter. The attendants for this meeting include Tyler Pita MD, Ashlyn Bruning PA-C, and patient, [name]. During the encounter, Tyler Pita MD, and Andi Devon, were located at [facility] Radiation Oncology Department.  Patient, Jayan Johns was located at home.   We personally spent 60 minutes in this encounter including chart review, reviewing radiological studies, meeting face-to-face with the patient, entering orders and completing documentation.    Nicholos Johns, PA-C    Tyler Pita, MD  [facility] Direct Dial: [phone]  Fax: [phone] [email or web address]  Skype  [email or web address]

## 2020-09-19 NOTE — Addendum Note (Signed)
Encounter addended by: Freeman Caldron, PA-C on: 09/19/2020 1:55 PM  Actions taken: Pend clinical note, Flowsheet accepted, In Basket message sent

## 2020-09-20 ENCOUNTER — Encounter: Payer: Self-pay | Admitting: Licensed Clinical Social Worker

## 2020-09-20 NOTE — Progress Notes (Signed)
[place] Psychosocial Distress Screening Clinical Social Work  Clinical Social Work was referred by distress screening protocol.  The patient scored a 10 on the Psychosocial Distress Thermometer which indicates severe distress. Clinical Social Worker  attempted to contact pt by phone  to assess for distress and other psychosocial needs.  No answer. Left message explaining support services on identified VM. Provided direct contact information.  ONCBCN DISTRESS SCREENING 09/19/2020  Screening Type Initial Screening  Distress experienced in past week (1-10) 10  Emotional problem type Nervousness/Anxiety      Benjamin Hines E Joanie Duprey, LCSW

## 2020-09-22 NOTE — Addendum Note (Signed)
Encounter addended by: Tyler Pita, MD on: 09/22/2020 4:54 PM  Actions taken: Medication List reviewed, Problem List reviewed, Allergies reviewed, Clinical Note Signed

## 2020-10-29 ENCOUNTER — Telehealth: Payer: Self-pay | Admitting: *Deleted

## 2020-10-29 NOTE — Telephone Encounter (Signed)
CALLED PATIENT TO REMIND OF PRE-SEED APPTS. FOR 10-31-20, SPOKE WITH PATIENT AND HE IS AWARE OF THESE APPTS.

## 2020-10-31 ENCOUNTER — Encounter (HOSPITAL_COMMUNITY)
Admission: RE | Admit: 2020-10-31 | Discharge: 2020-10-31 | Disposition: A | Discharge status: Home / Self Care | Payer: 59 | Source: Ambulatory Visit | Attending: Urology | Admitting: Urology

## 2020-10-31 ENCOUNTER — Ambulatory Visit
Admission: RE | Admit: 2020-10-31 | Discharge: 2020-10-31 | Disposition: A | Discharge status: Home / Self Care | Payer: 59 | Source: Ambulatory Visit | Attending: Urology | Admitting: Urology

## 2020-10-31 ENCOUNTER — Ambulatory Visit (HOSPITAL_COMMUNITY)
Admission: RE | Admit: 2020-10-31 | Discharge: 2020-10-31 | Disposition: A | Discharge status: Home / Self Care | Payer: 59 | Source: Ambulatory Visit | Attending: Urology | Admitting: Urology

## 2020-10-31 ENCOUNTER — Encounter: Payer: Self-pay | Admitting: Urology

## 2020-10-31 ENCOUNTER — Other Ambulatory Visit: Payer: Self-pay

## 2020-10-31 ENCOUNTER — Ambulatory Visit
Admission: RE | Admit: 2020-10-31 | Discharge: 2020-10-31 | Disposition: A | Discharge status: Home / Self Care | Payer: 59 | Source: Ambulatory Visit | Attending: Radiation Oncology | Admitting: Radiation Oncology

## 2020-10-31 DIAGNOSIS — Z01818 Encounter for other preprocedural examination: Secondary | ICD-10-CM | POA: Insufficient documentation

## 2020-10-31 DIAGNOSIS — C61 Malignant neoplasm of prostate: Secondary | ICD-10-CM

## 2020-10-31 NOTE — Progress Notes (Signed)
  Radiation Oncology         [phone]) [phone] ________________________________  Name: Benjamin Hines MRN: [id number]  Date: 10/31/2020  DOB: 09-27-1960  SIMULATION AND TREATMENT PLANNING NOTE PUBIC ARCH STUDY  [id number], Alinda Sierras, MD  Robley Fries, MD  DIAGNOSIS:  60 y.o. gentleman with Stage T1c adenocarcinoma of the prostate with Gleason score of 3+4, and PSA of 7.58.  Oncology History  Malignant neoplasm of prostate ([place])  08/12/2020 Cancer Staging   Staging form: Prostate, AJCC 8th Edition - Clinical stage from 08/12/2020: Stage IIB (cT1c, cN0, cM0, PSA: 7.6, Grade Group: 2) - Signed by Freeman Caldron, PA-C on 09/19/2020 Histopathologic type: Adenocarcinoma, NOS Stage prefix: Initial diagnosis Prostate specific antigen (PSA) range: Less than 10 Gleason primary pattern: 3 Gleason secondary pattern: 4 Gleason score: 7 Histologic grading system: 5 grade system Number of biopsy cores examined: 12 Number of biopsy cores positive: 1 Location of positive needle core biopsies: One side   09/19/2020 Initial Diagnosis   Malignant neoplasm of prostate ([place])       ICD-10-CM   1. Malignant neoplasm of prostate ([place])  C61       COMPLEX SIMULATION:  The patient presented today for evaluation for possible prostate seed implant. He was brought to the radiation planning suite and placed supine on the CT couch. A 3-dimensional image study set was obtained in upload to the planning computer. There, on each axial slice, I contoured the prostate gland. Then, using three-dimensional radiation planning tools I reconstructed the prostate in view of the structures from the transperineal needle pathway to assess for possible pubic arch interference. In doing so, I did not appreciate any pubic arch interference. Also, the patient's prostate volume was estimated based on the drawn structure. The volume was 31 cc.  Given the pubic arch appearance and prostate volume, patient remains a good candidate to  proceed with prostate seed implant. Today, he freely provided informed written consent to proceed.    PLAN: The patient will undergo prostate seed implant.   ________________________________  Sheral Apley. Tammi Klippel, M.D.

## 2020-10-31 NOTE — Progress Notes (Signed)
Patient reports occasional pelvic pain 5/10, lumbar pain 8/10, and occasional shortness of breath. No other symptoms reported at this time.  Currently taking Flomax 0.4mg  as directed. Urology follow-up appointment scheduled for November 29th, 2022.  Meaningful use complete.  *Patient has questions about a possible chiropractor for his back pain. Advised patient to discuss concerns w/ his PCP.

## 2020-11-01 ENCOUNTER — Encounter: Payer: Self-pay | Admitting: *Deleted

## 2020-11-01 NOTE — Progress Notes (Signed)
[place] Psychosocial Distress Screening Clinical Social Work  Clinical Social Work was referred by distress screening protocol.  The patient scored a 10 on the Psychosocial Distress Thermometer which indicates severe distress. Clinical Social Worker contacted patient by phone to assess for distress and other psychosocial needs.   Mr. Korol reported he is "doing fine".  Patient was at work at time of call, but stated he has no concerns at this time.  ONCBCN DISTRESS SCREENING 10/31/2020  Screening Type   Distress experienced in past week (1-10) 10  Emotional problem type Adjusting to illness    Clinical Social Worker follow up needed: No.  If yes, follow up plan:  Gwinda Maine, LCSW

## 2020-11-04 ENCOUNTER — Telehealth: Payer: Self-pay | Admitting: *Deleted

## 2020-11-04 NOTE — Telephone Encounter (Signed)
RETURNED PATIENT'S CALL, SPOKE WITH PATIENT 

## 2020-11-11 ENCOUNTER — Other Ambulatory Visit: Payer: Self-pay | Admitting: Urology

## 2020-11-15 ENCOUNTER — Telehealth: Payer: Self-pay | Admitting: *Deleted

## 2020-11-15 NOTE — Telephone Encounter (Signed)
CALLED PATIENT TO REMIND OF LAB APPT. FOR 11-22-20, SPOKE WITH PATIENT AND HE IS AWARE OF THIS APPT.

## 2020-11-18 ENCOUNTER — Other Ambulatory Visit: Payer: Self-pay | Admitting: Urology

## 2020-11-21 ENCOUNTER — Other Ambulatory Visit: Payer: Self-pay

## 2020-11-21 ENCOUNTER — Encounter (HOSPITAL_BASED_OUTPATIENT_CLINIC_OR_DEPARTMENT_OTHER): Payer: Self-pay | Admitting: Urology

## 2020-11-21 NOTE — Progress Notes (Addendum)
ADDENDUM:  pt had PAT lab appt today 11-22-2020.  Pt bp checked was 212/ 150. Pt told lab tech that he had not taken his medication past 2 days and did not have symptoms. Anesthesia, Konrad Felix PA , spoke w/ pt and advised him to take his medication as prescribed, check his bp at home and if high to call his pcp, pt stated he will go pick up his meds at pharmacy today. Called and spoke w/ pt at 1700 .  Pt stated he did pick up his medication at pharmacy will start taking , advised if dos his bp his high anesthesia may cancel his procedure, he verbalized understanding.   Spoke w/ via phone for pre-op interview--- pt Lab needs dos----   no            Lab results------ pt getting lab work done 11-22-2020 CBC/ CMP/ PT/ PTT ;  current ekg / cxr in epic/ chart COVID test -----patient states asymptomatic no test needed Arrive at ------- 1100 on 11-26-2020 NPO after MN NO Solid Food.  Clear liquids from MN until--- 1000 Med rec completed Medications to take morning of surgery ----- flomax, norvasc, lipitor Diabetic medication ----- n/a Patient instructed no nail polish to be worn day of surgery Patient instructed to bring photo id and insurance card day of surgery Patient aware to have Driver (ride ) / caregiver  for 24 hours after surgery --girlfriend, diane forrest  Patient Special Instructions ----- will do one fleet enema morning of surgery Pre-Op special Istructions ----- n/a Patient verbalized understanding of instructions that were given at this phone interview. Patient denies shortness of breath, chest pain, fever, cough at this phone interview.    Anesthesia Review:  HTN; AAA (4.6cm);  OSA (pt stated last used cpap 6 months, intolerant);  per last echo in epic ef 40-45%  PCP:  Dr B. Burchette (lov 10-25-2019 epic) Cardiologist : last seen dr Stanford Breed 02-15-2017 epic Vascular:  dr c. Maxwell Marion 07-23-2020 epi) Chest x-ray : 10-31-2020 epic EKG : 10-31-2020 epic Echo : 02-19-2017  epic Stress test:   nuclear 02-23-2017 epic (nuclear ef 35% no ischemia intermiate risk  Cardiac Cath :  no Activity level:  denies sob w/ any activity Sleep Study/ CPAP : Yes/ No  Blood Thinner/ Instructions /Last Dose: no ASA / Instructions/ Last Dose :   no

## 2020-11-22 ENCOUNTER — Encounter (HOSPITAL_COMMUNITY)
Admission: RE | Admit: 2020-11-22 | Discharge: 2020-11-22 | Disposition: A | Discharge status: Home / Self Care | Payer: 59 | Source: Ambulatory Visit | Attending: Urology | Admitting: Urology

## 2020-11-22 DIAGNOSIS — Z01812 Encounter for preprocedural laboratory examination: Secondary | ICD-10-CM | POA: Insufficient documentation

## 2020-11-22 LAB — COMPREHENSIVE METABOLIC PANEL
ALT: 26 U/L (ref 0–44)
AST: 29 U/L (ref 15–41)
Albumin: 4.1 g/dL (ref 3.5–5.0)
Alkaline Phosphatase: 78 U/L (ref 38–126)
Anion gap: 10 (ref 5–15)
BUN: 11 mg/dL (ref 8–23)
CO2: 25 mmol/L (ref 22–32)
Calcium: 8.9 mg/dL (ref 8.9–10.3)
Chloride: 104 mmol/L (ref 98–111)
Creatinine, Ser: 0.87 mg/dL (ref 0.61–1.24)
GFR, Estimated: >60 mL/min (ref >60)
Glucose, Bld: 101 mg/dL — ABNORMAL HIGH (ref 70–99)
Potassium: 3.9 mmol/L (ref 3.5–5.1)
Sodium: 139 mmol/L (ref 135–145)
Total Bilirubin: 0.8 mg/dL (ref 0.3–1.2)
Total Protein: 7.2 g/dL (ref 6.5–8.1)

## 2020-11-22 LAB — APTT: aPTT: 27 seconds (ref 24–36)

## 2020-11-22 LAB — CBC
HCT: 44.7 % (ref 39.0–52.0)
Hemoglobin: 14.8 g/dL (ref 13.0–17.0)
MCH: 30.2 pg (ref 26.0–34.0)
MCHC: 33.1 g/dL (ref 30.0–36.0)
MCV: 91.2 fL (ref 80.0–100.0)
Platelets: 241 10*3/uL (ref 150–400)
RBC: 4.9 MIL/uL (ref 4.22–5.81)
RDW: 13.2 % (ref 11.5–15.5)
WBC: 7.5 10*3/uL (ref 4.0–10.5)
nRBC: 0 % (ref 0.0–0.2)

## 2020-11-22 LAB — PROTIME-INR
INR: 1 (ref 0.8–1.2)
Prothrombin Time: 13 seconds (ref 11.4–15.2)

## 2020-11-25 ENCOUNTER — Telehealth: Payer: Self-pay | Admitting: *Deleted

## 2020-11-25 NOTE — Telephone Encounter (Signed)
CALLED PATIENT TO REMIND OF PROCEDURE FOR 11-26-20, LVM FOR A RETURN CALL

## 2020-11-25 NOTE — H&P (Signed)
CC/HPI: cc: prostate cancer   pre-biopsy PSA 7.5  Gleason 3+4=7 on biopsy 08/12/20  plan for bracytherapy   10/12/20: 60 year old man with an elevated PSA 7.5 a underwent prostate biopsy 08/12/2020. Pathology report showed Gleason 3+4=7 in 20% of 1/12 cores in the right mid gland. These in for component 5%. Patient has mild ED in gets headaches from PDE 5 inhibitors. He denies any bothersome lower urinary tract symptoms. TRUS size 31.86g. He is scheduled for brachytherapy with space oar on 11/26/2020.     ALLERGIES: None   MEDICATIONS: Tamsulosin Hcl 0.4 mg capsule  Zyrtec 10 mg tablet  Amlodipine Besylate 2.5 mg tablet  Atorvastatin Calcium 40 mg tablet  Fluticasone Propionate 50 mcg/actuation spray, suspension  Valsartan-Hydrochlorothiazide 160 mg-12.5 mg tablet  Vitamin B Complex     GU PSH: Prostate Needle Biopsy - 08/12/2020     NON-GU PSH: Foot surgery (unspecified), Left Surgical Pathology, Gross And Microscopic Examination For Prostate Needle - 08/12/2020     GU PMH: ED due to arterial insufficiency - 08/29/2020 Prostate Cancer - 08/29/2020 Elevated PSA - 08/12/2020, Discussed possible reasons for elevated PSA including recent infection, trauma, inflammation, indwelling catheter, enlarged prostate and prostate cancer. I would like to schedule transrectal ultrasound guided prostate biopsy for patient. I discussed risks and benefits of prostate biospy including blood in the urine/stool/semen, pain, and risk of post biopsy sepsis. He was given instructions regarding the prostate biospy. He will take 1 tab of levaquin 750mg  PO the morning of his prostate biopsy. Patient understands and agrees with the above. , - 07/17/2020    NON-GU PMH: Hypertension Sleep Apnea    FAMILY HISTORY: None   SOCIAL HISTORY: None   REVIEW OF SYSTEMS:    GU Review Male:   Patient reports frequent urination. Patient denies hard to postpone urination, burning/ pain with urination, get up at night to  urinate, leakage of urine, stream starts and stops, trouble starting your stream, have to strain to urinate , erection problems, and penile pain.  Gastrointestinal (Upper):   Patient denies nausea, vomiting, and indigestion/ heartburn.  Gastrointestinal (Lower):   Patient denies diarrhea and constipation.  Constitutional:   Patient denies fever, night sweats, weight loss, and fatigue.  Skin:   Patient denies skin rash/ lesion and itching.  Eyes:   Patient denies blurred vision and double vision.  Ears/ Nose/ Throat:   Patient denies sore throat and sinus problems.  Hematologic/Lymphatic:   Patient denies swollen glands and easy bruising.  Cardiovascular:   Patient denies leg swelling and chest pains.  Respiratory:   Patient denies cough and shortness of breath.  Endocrine:   Patient denies excessive thirst.  Musculoskeletal:   Patient reports joint pain. Patient denies back pain.  Neurological:   Patient denies headaches and dizziness.  Psychologic:   Patient denies depression and anxiety.   VITAL SIGNS: None   MULTI-SYSTEM PHYSICAL EXAMINATION:    Constitutional: Well-nourished. No physical deformities. Normally developed. Good grooming.  Neck: Neck symmetrical, not swollen. Normal tracheal position.  Respiratory: No labored breathing, no use of accessory muscles.   Skin: No paleness, no jaundice, no cyanosis. No lesion, no ulcer, no rash.  Neurologic / Psychiatric: Oriented to time, oriented to place, oriented to person. No depression, no anxiety, no agitation.  Gastrointestinal: No rigidity, non obese abdomen.   Eyes: Normal conjunctivae. Normal eyelids.  Ears, Nose, Mouth, and Throat: Left ear no scars, no lesions, no masses. Right ear no scars, no lesions, no masses. Nose no scars,  no lesions, no masses. Normal hearing. Normal lips.  Musculoskeletal: Normal gait and station of head and neck.     Complexity of Data:  Records Review:   Previous Patient Records, POC Tool  Urine Test  Review:   Urinalysis   07/17/20  PSA  Total PSA 7.58 ng/mL  Free PSA 0.45 ng/mL  % Free PSA 6 % PSA   Notes:                     03/06/2020: BUN 18, creatinine 0.99   PROCEDURES:          Urinalysis Dipstick Dipstick Cont'd  Color: Yellow Bilirubin: Neg mg/dL  Appearance: Clear Ketones: Neg mg/dL  Specific Gravity: 1.025 Blood: Neg ery/uL  pH: 6.5 Protein: Trace mg/dL  Glucose: Neg mg/dL Urobilinogen: 0.2 mg/dL    Nitrites: Neg    Leukocyte Esterase: Neg leu/uL    ASSESSMENT:      ICD-10 Details  1 GU:   Prostate Cancer - C61 Chronic, Stable   PLAN:           Document Letter(s):  Created for Patient: Clinical Summary         Notes:   Discussed brachytherapy and Space OAR which is scheduled for 11/26/2020. I discussed limitations following this procedure including heavy lifting, riding on her tractor, being near children/pregnant woman. He is scheduled for preop testing tomorrow. He has seen Dr. Tammi Klippel for radiation oncology consultation and understands the alternatives to brahcytherapy. He will proceed as scheduled.

## 2020-11-26 ENCOUNTER — Encounter (HOSPITAL_BASED_OUTPATIENT_CLINIC_OR_DEPARTMENT_OTHER): Payer: Self-pay | Admitting: Urology

## 2020-11-26 ENCOUNTER — Ambulatory Visit (HOSPITAL_BASED_OUTPATIENT_CLINIC_OR_DEPARTMENT_OTHER): Payer: 59 | Admitting: Physician Assistant

## 2020-11-26 ENCOUNTER — Encounter (HOSPITAL_BASED_OUTPATIENT_CLINIC_OR_DEPARTMENT_OTHER)
Admission: RE | Disposition: A | Discharge status: Home / Self Care | Payer: Self-pay | Source: Home / Self Care | Attending: Urology

## 2020-11-26 ENCOUNTER — Ambulatory Visit (HOSPITAL_COMMUNITY): Payer: 59

## 2020-11-26 ENCOUNTER — Ambulatory Visit (HOSPITAL_BASED_OUTPATIENT_CLINIC_OR_DEPARTMENT_OTHER)
Admission: RE | Admit: 2020-11-26 | Discharge: 2020-11-26 | Disposition: A | Discharge status: Home / Self Care | Payer: 59 | Attending: Urology | Admitting: Urology

## 2020-11-26 ENCOUNTER — Ambulatory Visit (HOSPITAL_BASED_OUTPATIENT_CLINIC_OR_DEPARTMENT_OTHER): Payer: 59 | Admitting: Anesthesiology

## 2020-11-26 DIAGNOSIS — Z79899 Other long term (current) drug therapy: Secondary | ICD-10-CM | POA: Insufficient documentation

## 2020-11-26 DIAGNOSIS — C61 Malignant neoplasm of prostate: Secondary | ICD-10-CM | POA: Diagnosis present

## 2020-11-26 DIAGNOSIS — I110 Hypertensive heart disease with heart failure: Secondary | ICD-10-CM | POA: Diagnosis not present

## 2020-11-26 DIAGNOSIS — G4730 Sleep apnea, unspecified: Secondary | ICD-10-CM | POA: Insufficient documentation

## 2020-11-26 DIAGNOSIS — Z87891 Personal history of nicotine dependence: Secondary | ICD-10-CM | POA: Diagnosis not present

## 2020-11-26 DIAGNOSIS — I509 Heart failure, unspecified: Secondary | ICD-10-CM | POA: Diagnosis not present

## 2020-11-26 DIAGNOSIS — N5201 Erectile dysfunction due to arterial insufficiency: Secondary | ICD-10-CM | POA: Diagnosis not present

## 2020-11-26 HISTORY — DX: Obstructive sleep apnea (adult) (pediatric): G47.33

## 2020-11-26 HISTORY — DX: Unspecified osteoarthritis, unspecified site: M19.90

## 2020-11-26 HISTORY — DX: Nocturia: R35.1

## 2020-11-26 HISTORY — PX: RADIOACTIVE SEED IMPLANT: SHX5150

## 2020-11-26 HISTORY — PX: CYSTOSCOPY: SHX5120

## 2020-11-26 HISTORY — PX: SPACE OAR INSTILLATION: SHX6769

## 2020-11-26 SURGERY — INSERTION, RADIATION SOURCE, PROSTATE
Anesthesia: General | Site: Urethra

## 2020-11-26 MED ORDER — TRAMADOL HCL 50 MG PO TABS
50.0000 mg | ORAL_TABLET | Freq: Four times a day (QID) | ORAL | 0 refills | Status: DC | PRN
Start: 2020-11-26 — End: 2021-02-14

## 2020-11-26 MED ORDER — FENTANYL CITRATE (PF) 100 MCG/2ML IJ SOLN
INTRAMUSCULAR | Status: AC
  Filled 2020-11-26: qty 2

## 2020-11-26 MED ORDER — AMISULPRIDE (ANTIEMETIC) 5 MG/2ML IV SOLN: 10.0000 mg | Freq: Once | INTRAVENOUS | Status: DC | PRN

## 2020-11-26 MED ORDER — OXYCODONE HCL 5 MG PO TABS: 5.0000 mg | ORAL_TABLET | Freq: Once | ORAL | Status: DC | PRN

## 2020-11-26 MED ORDER — FENTANYL CITRATE (PF) 100 MCG/2ML IJ SOLN
INTRAMUSCULAR | Status: DC | PRN
  Administered 2020-11-26: 50 ug via INTRAVENOUS

## 2020-11-26 MED ORDER — CEFAZOLIN SODIUM-DEXTROSE 2-4 GM/100ML-% IV SOLN
2.0000 g | Freq: Once | INTRAVENOUS | Status: AC
  Administered 2020-11-26: 2 g via INTRAVENOUS

## 2020-11-26 MED ORDER — LABETALOL HCL 5 MG/ML IV SOLN
INTRAVENOUS | Status: AC
  Filled 2020-11-26: qty 4

## 2020-11-26 MED ORDER — CEFAZOLIN SODIUM-DEXTROSE 2-4 GM/100ML-% IV SOLN
INTRAVENOUS | Status: AC
  Filled 2020-11-26: qty 100

## 2020-11-26 MED ORDER — DEXAMETHASONE SODIUM PHOSPHATE 4 MG/ML IJ SOLN
INTRAMUSCULAR | Status: DC | PRN
  Administered 2020-11-26: 8 mg via INTRAVENOUS

## 2020-11-26 MED ORDER — MIDAZOLAM HCL 2 MG/2ML IJ SOLN
INTRAMUSCULAR | Status: DC | PRN
  Administered 2020-11-26: 1 mg via INTRAVENOUS

## 2020-11-26 MED ORDER — EPHEDRINE 5 MG/ML INJ
INTRAVENOUS | Status: AC
  Filled 2020-11-26: qty 5

## 2020-11-26 MED ORDER — AMLODIPINE BESYLATE 10 MG PO TABS
10.0000 mg | ORAL_TABLET | Freq: Once | ORAL | Status: AC
  Administered 2020-11-26: 10 mg via ORAL
  Filled 2020-11-26: qty 1

## 2020-11-26 MED ORDER — LIDOCAINE 2% (20 MG/ML) 5 ML SYRINGE
INTRAMUSCULAR | Status: AC
  Filled 2020-11-26: qty 15

## 2020-11-26 MED ORDER — STERILE WATER FOR IRRIGATION IR SOLN
Status: DC | PRN
  Administered 2020-11-26: 500 mL

## 2020-11-26 MED ORDER — MIDAZOLAM HCL 2 MG/2ML IJ SOLN
INTRAMUSCULAR | Status: AC
  Filled 2020-11-26: qty 2

## 2020-11-26 MED ORDER — DEXAMETHASONE SODIUM PHOSPHATE 10 MG/ML IJ SOLN
INTRAMUSCULAR | Status: AC
  Filled 2020-11-26: qty 3

## 2020-11-26 MED ORDER — PROPOFOL 500 MG/50ML IV EMUL
INTRAVENOUS | Status: AC
  Filled 2020-11-26: qty 50

## 2020-11-26 MED ORDER — LACTATED RINGERS IV SOLN: INTRAVENOUS | Status: DC

## 2020-11-26 MED ORDER — SODIUM CHLORIDE (PF) 0.9 % IJ SOLN
INTRAMUSCULAR | Status: DC | PRN
  Administered 2020-11-26: 10 mL

## 2020-11-26 MED ORDER — FLEET ENEMA 7-19 GM/118ML RE ENEM: 1.0000 | ENEMA | Freq: Once | RECTAL | Status: DC

## 2020-11-26 MED ORDER — ONDANSETRON HCL 4 MG/2ML IJ SOLN
INTRAMUSCULAR | Status: DC | PRN
  Administered 2020-11-26: 4 mg via INTRAVENOUS

## 2020-11-26 MED ORDER — LIDOCAINE HCL (CARDIAC) PF 100 MG/5ML IV SOSY
PREFILLED_SYRINGE | INTRAVENOUS | Status: DC | PRN
  Administered 2020-11-26: 100 mg via INTRAVENOUS

## 2020-11-26 MED ORDER — ONDANSETRON HCL 4 MG/2ML IJ SOLN
INTRAMUSCULAR | Status: AC
  Filled 2020-11-26: qty 6

## 2020-11-26 MED ORDER — EPHEDRINE SULFATE 50 MG/ML IJ SOLN
INTRAMUSCULAR | Status: DC | PRN
  Administered 2020-11-26 (×2): 5 mg via INTRAVENOUS

## 2020-11-26 MED ORDER — FENTANYL CITRATE (PF) 100 MCG/2ML IJ SOLN: 25.0000 ug | INTRAMUSCULAR | Status: DC | PRN

## 2020-11-26 MED ORDER — LABETALOL HCL 5 MG/ML IV SOLN
10.0000 mg | INTRAVENOUS | Status: DC | PRN
  Administered 2020-11-26: 10 mg via INTRAVENOUS

## 2020-11-26 MED ORDER — ACETAMINOPHEN 500 MG PO TABS
1000.0000 mg | ORAL_TABLET | Freq: Once | ORAL | Status: AC
  Administered 2020-11-26: 1000 mg via ORAL

## 2020-11-26 MED ORDER — DEXMEDETOMIDINE (PRECEDEX) IN NS 20 MCG/5ML (4 MCG/ML) IV SYRINGE
PREFILLED_SYRINGE | INTRAVENOUS | Status: AC
  Filled 2020-11-26: qty 5

## 2020-11-26 MED ORDER — PHENYLEPHRINE 40 MCG/ML (10ML) SYRINGE FOR IV PUSH (FOR BLOOD PRESSURE SUPPORT)
PREFILLED_SYRINGE | INTRAVENOUS | Status: AC
  Filled 2020-11-26: qty 20

## 2020-11-26 MED ORDER — ACETAMINOPHEN 500 MG PO TABS
ORAL_TABLET | ORAL | Status: AC
  Filled 2020-11-26: qty 2

## 2020-11-26 MED ORDER — PHENYLEPHRINE HCL (PRESSORS) 10 MG/ML IV SOLN
INTRAVENOUS | Status: DC | PRN
  Administered 2020-11-26: 80 ug via INTRAVENOUS
  Administered 2020-11-26: 120 ug via INTRAVENOUS
  Administered 2020-11-26: 80 ug via INTRAVENOUS
  Administered 2020-11-26: 120 ug via INTRAVENOUS
  Administered 2020-11-26 (×2): 80 ug via INTRAVENOUS
  Administered 2020-11-26 (×2): 120 ug via INTRAVENOUS

## 2020-11-26 MED ORDER — SODIUM CHLORIDE 0.9 % IV SOLN
INTRAVENOUS | Status: AC | PRN
  Administered 2020-11-26: 1000 mL

## 2020-11-26 MED ORDER — IOHEXOL 300 MG/ML  SOLN
INTRAMUSCULAR | Status: DC | PRN
  Administered 2020-11-26: 7 mL

## 2020-11-26 MED ORDER — ONDANSETRON HCL 4 MG/2ML IJ SOLN: 4.0000 mg | Freq: Once | INTRAMUSCULAR | Status: DC | PRN

## 2020-11-26 MED ORDER — OXYCODONE HCL 5 MG/5ML PO SOLN: 5.0000 mg | Freq: Once | ORAL | Status: DC | PRN

## 2020-11-26 MED ORDER — PROPOFOL 10 MG/ML IV BOLUS
INTRAVENOUS | Status: DC | PRN
  Administered 2020-11-26 (×4): 20 mg via INTRAVENOUS
  Administered 2020-11-26: 140 mg via INTRAVENOUS

## 2020-11-26 SURGICAL SUPPLY — 39 items
BAG DRN RND TRDRP ANRFLXCHMBR (UROLOGICAL SUPPLIES) ×2
BAG URINE DRAIN 2000ML AR STRL (UROLOGICAL SUPPLIES) ×4 IMPLANT
BLADE CLIPPER SENSICLIP SURGIC (BLADE) ×4 IMPLANT
CATH FOLEY 2WAY SLVR  5CC 16FR (CATHETERS) ×4
CATH FOLEY 2WAY SLVR 5CC 16FR (CATHETERS) ×2 IMPLANT
CATH ROBINSON RED A/P 20FR (CATHETERS) ×4 IMPLANT
CLOTH BEACON ORANGE TIMEOUT ST (SAFETY) ×4 IMPLANT
CNTNR URN SCR LID CUP LEK RST (MISCELLANEOUS) ×2 IMPLANT
CONT SPEC 4OZ STRL OR WHT (MISCELLANEOUS) ×4
COVER BACK TABLE 60X90IN (DRAPES) ×4 IMPLANT
COVER MAYO STAND STRL (DRAPES) ×4 IMPLANT
DRSG TEGADERM 4X4.75 (GAUZE/BANDAGES/DRESSINGS) ×4 IMPLANT
DRSG TEGADERM 8X12 (GAUZE/BANDAGES/DRESSINGS) ×4 IMPLANT
GAUZE SPONGE 4X4 12PLY STRL (GAUZE/BANDAGES/DRESSINGS) ×4 IMPLANT
GEL ULTRASOUND 20GR AQUASONIC (MISCELLANEOUS) ×4 IMPLANT
GLOVE SURG ENC MOIS LTX SZ6.5 (GLOVE) ×12 IMPLANT
GLOVE SURG ENC MOIS LTX SZ8 (GLOVE) ×4 IMPLANT
GLOVE SURG NEOP MICRO LF SZ6.5 (GLOVE) ×4 IMPLANT
GLOVE SURG ORTHO LTX SZ8.5 (GLOVE) ×12 IMPLANT
GLOVE SURG UNDER LTX SZ6.5 (GLOVE) ×4 IMPLANT
GLOVE SURG UNDER POLY LF SZ6.5 (GLOVE) ×4 IMPLANT
GOWN STRL REUS W/ TWL LRG LVL3 (GOWN DISPOSABLE) ×2 IMPLANT
GOWN STRL REUS W/TWL LRG LVL3 (GOWN DISPOSABLE) ×8 IMPLANT
GRID BRACH TEMP 18GA 2.8X3X.75 (MISCELLANEOUS) ×4 IMPLANT
HOLDER FOLEY CATH W/STRAP (MISCELLANEOUS) ×4 IMPLANT
I-125 Seeds ×252 IMPLANT
IMPL SPACEOAR VUE SYSTEM (Spacer) ×2 IMPLANT
IMPLANT SPACEOAR VUE SYSTEM (Spacer) ×4 IMPLANT
IV NS 1000ML (IV SOLUTION) ×4
IV NS 1000ML BAXH (IV SOLUTION) ×2 IMPLANT
KIT TURNOVER CYSTO (KITS) ×4 IMPLANT
NEEDLE BRACHY 18G 5PK (NEEDLE) ×16 IMPLANT
NEEDLE PK MORGANSTERN STABILIZ (NEEDLE) ×4 IMPLANT
PACK CYSTO (CUSTOM PROCEDURE TRAY) ×4 IMPLANT
SYR 10ML LL (SYRINGE) ×4 IMPLANT
SYR CONTROL 10ML LL (SYRINGE) ×8 IMPLANT
TOWEL OR 17X26 10 PK STRL BLUE (TOWEL DISPOSABLE) ×4 IMPLANT
UNDERPAD 30X36 HEAVY ABSORB (UNDERPADS AND DIAPERS) ×8 IMPLANT
WATER STERILE IRR 500ML POUR (IV SOLUTION) ×4 IMPLANT

## 2020-11-26 NOTE — Anesthesia Preprocedure Evaluation (Signed)
Anesthesia Evaluation  Patient identified by MRN, date of birth, ID band Patient awake    Reviewed: Allergy & Precautions, NPO status , Patient's Chart, lab work & pertinent test results  History of Anesthesia Complications Negative for: history of anesthetic complications  Airway Mallampati: II  TM Distance: >3 FB Neck ROM: Full    Dental  (+) Dental Advisory Given, Missing, Loose,    Pulmonary sleep apnea , former smoker,    Pulmonary exam normal        Cardiovascular hypertension, Pt. on medications +CHF  Normal cardiovascular exam  4.6 cm AAA  Echo 2019: EF 40-45%, g2dd, normal valves  Myoview 2019: EF 30-44%, no ischemia, intermediate risk d/t LV dysfunction   Neuro/Psych negative neurological ROS     GI/Hepatic GERD  ,(+)     substance abuse  alcohol use,   Endo/Other  negative endocrine ROS  Renal/GU negative Renal ROS   Prostate cancer    Musculoskeletal negative musculoskeletal ROS (+)   Abdominal   Peds  Hematology negative hematology ROS (+)   Anesthesia Other Findings   Reproductive/Obstetrics                          Anesthesia Physical Anesthesia Plan  ASA: 3  Anesthesia Plan: General   Post-op Pain Management:    Induction: Intravenous  PONV Risk Score and Plan: 2 and Ondansetron, Dexamethasone, Midazolam and Treatment may vary due to age or medical condition  Airway Management Planned: LMA  Additional Equipment: None  Intra-op Plan:   Post-operative Plan: Extubation in OR  Informed Consent: I have reviewed the patients History and Physical, chart, labs and discussed the procedure including the risks, benefits and alternatives for the proposed anesthesia with the patient or authorized representative who has indicated his/her understanding and acceptance.     Dental advisory given  Plan Discussed with:   Anesthesia Plan Comments:          Anesthesia Quick Evaluation

## 2020-11-26 NOTE — Transfer of Care (Signed)
Immediate Anesthesia Transfer of Care Note  Patient: NORLAN RANN  Procedure(s) Performed: RADIOACTIVE SEED IMPLANT/BRACHYTHERAPY IMPLANT (Prostate) SPACE OAR INSTILLATION (Prostate) CYSTOSCOPY FLEXIBLE (Urethra)  Patient Location: PACU  Anesthesia Type:General  Level of Consciousness: awake, alert , oriented and patient cooperative  Airway & Oxygen Therapy: Patient Spontanous Breathing and Patient connected to nasal cannula oxygen  Post-op Assessment: Report given to RN and Post -op Vital signs reviewed and stable  Post vital signs: Reviewed and stable  Last Vitals:  Vitals Value Taken Time  BP 139/97 11/26/20 1430  Temp    Pulse 95 11/26/20 1433  Resp 17 11/26/20 1433  SpO2 99 % 11/26/20 1433  Vitals shown include unvalidated device data.  Last Pain:  Vitals:   11/26/20 1107  TempSrc: Oral  PainSc: 5       Patients Stated Pain Goal: 4 (67/59/16 3846)  Complications: No notable events documented.

## 2020-11-26 NOTE — Anesthesia Postprocedure Evaluation (Signed)
Anesthesia Post Note  Patient: Benjamin Hines  Procedure(s) Performed: RADIOACTIVE SEED IMPLANT/BRACHYTHERAPY IMPLANT (Prostate) SPACE OAR INSTILLATION (Prostate) CYSTOSCOPY FLEXIBLE (Urethra)     Patient location during evaluation: PACU Anesthesia Type: General Level of consciousness: awake and alert Pain management: pain level controlled Vital Signs Assessment: post-procedure vital signs reviewed and stable Respiratory status: spontaneous breathing, nonlabored ventilation and respiratory function stable Cardiovascular status: blood pressure returned to baseline and stable Postop Assessment: no apparent nausea or vomiting Anesthetic complications: no   No notable events documented.  Last Vitals:  Vitals:   11/26/20 1447 11/26/20 1500  BP: (!) 146/107 (!) 158/107  Pulse: 95 89  Resp: 19 11  Temp:    SpO2: 99% 94%    Last Pain:  Vitals:   11/26/20 1500  TempSrc:   PainSc: 3                  Lidia Collum

## 2020-11-26 NOTE — Discharge Instructions (Addendum)
DISCHARGE INSTRUCTIONS FOR PROSTATE SEED IMPLANTATION   Antibiotics You may be given a prescription for an antibiotic to take when you arrive home. If so, be sure to take every tablet in the bottle, even if you are feeling better before the prescription is finished. If you begin itching, notice a rash or start to swell on your trunk, arms, legs and/or throat, immediately stop taking the antibiotic and call your Urologist. Diet Resume your usual diet when you return home. To keep your bowels moving easily and softly, drink prune, apple and cranberry juice at room temperature. You may also take a stool softener, such as Colace, which is available without prescription at local pharmacies. Daily activities No driving or heavy lifting for at least two days after the implant. No bike riding, horseback riding or riding lawn mowers for the first month after the implant. Any strenuous physical activity should be approved by your doctor before you resume it. Sexual relations You may resume sexual relations two weeks after the procedure. A condom should be used for the first two weeks. Your semen may be dark brown or black; this is normal and is related bleeding that may have occurred during the implant. Postoperative swelling Expect swelling and bruising of the scrotum and perineum (the area between the scrotum and anus). Both the swelling and the bruising should resolve in l or 2 weeks. Ice packs and over- the-counter medications such as Tylenol, Advil or Aleve may lessen your discomfort. Postoperative urination Most men experience burning on urination and/or urinary frequency. If this becomes bothersome, contact your Urologist.  Medication can be prescribed to relieve these problems.  It is normal to have some blood in your urine for a few days after the implant. Special instructions related to the seeds It is unlikely that you will pass an Iodine-125 seed in your urine. The seeds are silver in color and  are about as large as a grain of rice. If you pass a seed, do not handle it with your fingers. Use a spoon to place it in an envelope or jar in place this in base occluded area such as the garage or basement for return to the radiation clinic at your convenience.  Contact your doctor for Temperature greater than 101 F Increasing pain Inability to urinate Follow-up  You should have follow up with your urologist and radiation oncologist about 3 weeks after the procedure. General information regarding Iodine seeds Iodine-125 is a low energy radioactive material. It is not deeply penetrating and loses energy at short distances. Your prostate will absorb the radiation. Objects that are touched or used by the patient do not become radioactive. Body wastes (urine and stool) or body fluids (saliva, tears, semen or blood) are not radioactive. The Nuclear Regulatory Commission (NRC) has determined that no radiation precautions are needed for patients undergoing Iodine-125 seed implantation. The NRC states that such patients do not present a risk to the people around them, including young children and pregnant women. However, in keeping with the general principle that radiation exposure should be kept as low reasonably possible, we suggest the following: Children and pets should not sit on the patient's lap for the first two (2) weeks after the implant. Pregnant (or possibly pregnant) women should avoid prolonged, close contact with the patient for the first two (2) weeks after the implant. A distance of three (3) feet is acceptable. At a distance of three (3) feet, there is no limit to the length of time anyone can   with the patient.      No acetaminophen/Tylenol until after 5:15pm today if needed for pain.   **Do NOT take Norvasc 11/15. Patient had dose in-patient** ** Take Valsartan once home**     Post Anesthesia Home Care Instructions  Activity: Get plenty of rest for the remainder of the day.  A responsible individual must stay with you for 24 hours following the procedure.  For the next 24 hours, DO NOT: -Drive a car -Paediatric nurse -Drink alcoholic beverages -Take any medication unless instructed by your physician -Make any legal decisions or sign important papers.  Meals: Start with liquid foods such as gelatin or soup. Progress to regular foods as tolerated. Avoid greasy, spicy, heavy foods. If nausea and/or vomiting occur, drink only clear liquids until the nausea and/or vomiting subsides. Call your physician if vomiting continues.  Special Instructions/Symptoms: Your throat may feel dry or sore from the anesthesia or the breathing tube placed in your throat during surgery. If this causes discomfort, gargle with warm salt water. The discomfort should disappear within 24 hours.

## 2020-11-26 NOTE — Progress Notes (Signed)
  Radiation Oncology         [phone]) [phone] ________________________________  Name: DYLANN GALLIER MRN: [id number]  Date: 11/26/2020  DOB: [birth year]       Prostate Seed Implant  OY:DXAJOINOM, Alinda Sierras, MD  No ref. provider found  DIAGNOSIS:  60 y.o. gentleman with Stage T1c adenocarcinoma of the prostate with Gleason score of 3+4, and PSA of 7.58.  Oncology History  Malignant neoplasm of prostate ([place])  08/12/2020 Cancer Staging   Staging form: Prostate, AJCC 8th Edition - Clinical stage from 08/12/2020: Stage IIB (cT1c, cN0, cM0, PSA: 7.6, Grade Group: 2) - Signed by Freeman Caldron, PA-C on 09/19/2020 Histopathologic type: Adenocarcinoma, NOS Stage prefix: Initial diagnosis Prostate specific antigen (PSA) range: Less than 10 Gleason primary pattern: 3 Gleason secondary pattern: 4 Gleason score: 7 Histologic grading system: 5 grade system Number of biopsy cores examined: 12 Number of biopsy cores positive: 1 Location of positive needle core biopsies: One side    09/19/2020 Initial Diagnosis   Malignant neoplasm of prostate ([place])     No diagnosis found.  PROCEDURE: Insertion of radioactive I-125 seeds into the prostate gland.  RADIATION DOSE: 145 Gy, definitive therapy.  TECHNIQUE: GRANTLEY SAVAGE was brought to the operating room with the urologist. He was placed in the dorsolithotomy position. He was catheterized and a rectal tube was inserted. The perineum was shaved, prepped and draped. The ultrasound probe was then introduced into the rectum to see the prostate gland.  TREATMENT DEVICE: A needle grid was attached to the ultrasound probe stand and anchor needles were placed.  3D PLANNING: The prostate was imaged in 3D using a sagittal sweep of the prostate probe. These images were transferred to the planning computer. There, the prostate, urethra and rectum were defined on each axial reconstructed image. Then, the software created an optimized 3D plan and a few seed positions  were adjusted. The quality of the plan was reviewed using [facility] information for the target and the following two organs at risk:  Urethra and Rectum.  Then the accepted plan was printed and handed off to the radiation therapist.  Under my supervision, the custom loading of the seeds and spacers was carried out and loaded into sealed vicryl sleeves.  These pre-loaded needles were then placed into the needle holder.Marland Kitchen  PROSTATE VOLUME STUDY:  Using transrectal ultrasound the volume of the prostate was verified to be 32 cc.  SPECIAL TREATMENT PROCEDURE/SUPERVISION AND HANDLING: The pre-loaded needles were then delivered under sagittal guidance. A total of 19 needles were used to deposit 63 seeds in the prostate gland. The individual seed activity was 0.408 mCi.  SpaceOAR:  Yes  COMPLEX SIMULATION: At the end of the procedure, an anterior radiograph of the pelvis was obtained to document seed positioning and count. Cystoscopy was performed to check the urethra and bladder.  MICRODOSIMETRY: At the end of the procedure, the patient was emitting 0.098 mR/hr at 1 meter. Accordingly, he was considered safe for hospital discharge.  PLAN: The patient will return to the radiation oncology clinic for post implant CT dosimetry in three weeks.   ________________________________  Sheral Apley Tammi Klippel, M.D.

## 2020-11-26 NOTE — Anesthesia Procedure Notes (Signed)
Procedure Name: LMA Insertion Date/Time: 11/26/2020 1:18 PM Performed by: Georgeanne Nim, CRNA Pre-anesthesia Checklist: Patient identified, Emergency Drugs available, Suction available, Patient being monitored and Timeout performed Patient Re-evaluated:Patient Re-evaluated prior to induction Oxygen Delivery Method: Circle system utilized Preoxygenation: Pre-oxygenation with 100% oxygen Induction Type: IV induction Ventilation: Mask ventilation without difficulty LMA: LMA inserted LMA Size: 4.0 Number of attempts: 1 Placement Confirmation: positive ETCO2, CO2 detector and breath sounds checked- equal and bilateral Tube secured with: Tape Dental Injury: Teeth and Oropharynx as per pre-operative assessment

## 2020-11-26 NOTE — Interval H&P Note (Signed)
History and Physical Interval Note:  11/26/2020 12:17 PM  Benjamin Hines  has presented today for surgery, with the diagnosis of PROSTATE CANCER.  The various methods of treatment have been discussed with the patient and family. After consideration of risks, benefits and other options for treatment, the patient has consented to  Procedure(s) with comments: RADIOACTIVE SEED IMPLANT/BRACHYTHERAPY IMPLANT (N/A) - 90 MINS SPACE OAR INSTILLATION (N/A) as a surgical intervention.  The patient's history has been reviewed, patient examined, no change in status, stable for surgery.  I have reviewed the patient's chart and labs.  Questions were answered to the patient's satisfaction.     Kristopher Delk D Navneet Schmuck

## 2020-11-26 NOTE — Op Note (Signed)
PATIENT:  Benjamin Hines  PRE-OPERATIVE DIAGNOSIS:  Adenocarcinoma of the prostate  POST-OPERATIVE DIAGNOSIS:  Same  PROCEDURE:  1. I-125 radioactive seed implantation 2. Cystoscopy  3. Placement of SpaceOAR 4. Fluoroscopy use with time less than 1 hour.  SURGEON:  Jacalyn Lefevre, MD  Radiation oncologist: Dr. Tyler Pita  ANESTHESIA:  General  EBL:  Minimal  DRAINS: None  INDICATION: Benjamin Hines is a 60 yo man with prostate cancer her for brachytherapy and SpaceOAR.   Description of procedure: After informed consent the patient was brought to the major OR, placed on the table and administered general anesthesia. He was then moved to the modified lithotomy position with his perineum perpendicular to the floor. His perineum and genitalia were then sterilely prepped. An official timeout was then performed. A 16 French Foley catheter was then placed in the bladder and filled with dilute contrast, a rectal tube was placed in the rectum and the transrectal ultrasound probe was placed in the rectum and affixed to the stand. He was then sterilely draped.  Real time ultrasonography was used along with the seed planning software [employer] Prostate vs. 4.2.2.4. This was used to develop the seed plan including the number of needles as well as number of seeds required for complete and adequate coverage.  The needles were then preloaded with seeds and spacers according to the previously developed plan.  Real-time ultrasonography was then used along with the previously developed plan to implant a total of 63 seeds using 19 needles. This proceeded without difficulty or complication.   I then proceeded with placement of SpaceOAR by introducing a needle with the bevel angled inferiorly approximately 2 cm superior to the anus. This was angled downward and under direct ultrasound was placed within the space between the prostatic capsule and rectum. This was confirmed with a small amount of sterile  saline injected and this was performed under direct ultrasound. I then attached the SpaceOAR to the needle and injected this in the space between the prostate and rectum with good placement noted.  A Foley catheter was then removed as well as the transrectal ultrasound probe and rectal probe. Flexible cystoscopy was then performed using the 17 French flexible scope which revealed a normal urethra throughout its length down to the sphincter which appeared intact. The prostatic urethra revealed bilobar hypertrophy but no evidence of obstruction, seeds, spacers or lesions. The bladder was then entered and fully and systematically inspected. The ureteral orifices were noted to be of normal configuration and position. The mucosa revealed no evidence of tumors. There were also no stones identified within the bladder. I noted no seeds or spacers on the floor of the bladder and retroflexion of the scope revealed no seeds protruding from the base of the prostate.  C-arm fluoroscopy was then used to evaluate the distribution of seeds placement and aid in determining confirmation of the number of seeds placed.  Real-time fluoroscopy was used with saved images revealing the location of the seeds placed both in AP and oblique views.  The cystoscope was then removed and the patient was awakened and taken to recovery room in stable and satisfactory condition. He tolerated procedure well and there were no intraoperative complications.

## 2020-11-27 ENCOUNTER — Encounter (HOSPITAL_BASED_OUTPATIENT_CLINIC_OR_DEPARTMENT_OTHER): Payer: Self-pay | Admitting: Urology

## 2020-12-16 ENCOUNTER — Telehealth: Payer: Self-pay | Admitting: *Deleted

## 2020-12-16 NOTE — Telephone Encounter (Signed)
CALLED PATIENT TO REMIND OF POST SEED APPTS. FOR 12-17-20, SPOKE WITH PATIENT AND HE IS AWARE OF THESE APPTS.

## 2020-12-17 ENCOUNTER — Ambulatory Visit
Admission: RE | Admit: 2020-12-17 | Discharge: 2020-12-17 | Disposition: A | Discharge status: Home / Self Care | Payer: 59 | Source: Ambulatory Visit | Attending: Radiation Oncology | Admitting: Radiation Oncology

## 2020-12-17 ENCOUNTER — Ambulatory Visit
Admission: RE | Admit: 2020-12-17 | Discharge: 2020-12-17 | Disposition: A | Discharge status: Home / Self Care | Payer: 59 | Source: Ambulatory Visit | Attending: Urology | Admitting: Urology

## 2020-12-17 ENCOUNTER — Other Ambulatory Visit: Payer: Self-pay

## 2020-12-17 ENCOUNTER — Encounter: Payer: Self-pay | Admitting: Urology

## 2020-12-17 VITALS — BP 163/121 | HR 95 | Temp 97.9°F | Resp 20 | Ht 69.0 in | Wt 199.0 lb

## 2020-12-17 DIAGNOSIS — C61 Malignant neoplasm of prostate: Secondary | ICD-10-CM

## 2020-12-17 DIAGNOSIS — Z923 Personal history of irradiation: Secondary | ICD-10-CM | POA: Diagnosis not present

## 2020-12-17 DIAGNOSIS — Z79899 Other long term (current) drug therapy: Secondary | ICD-10-CM | POA: Insufficient documentation

## 2020-12-17 NOTE — Progress Notes (Signed)
  Radiation Oncology         [phone]) [phone] ________________________________  Name: Benjamin Hines MRN: [id number]  Date: 12/17/2020  DOB: 08-30-1960  COMPLEX SIMULATION NOTE  NARRATIVE:  The patient was brought to the [facility] today following prostate seed implantation approximately one month ago.  Identity was confirmed.  All relevant records and images related to the planned course of therapy were reviewed.  Then, the patient was set-up supine.  CT images were obtained.  The CT images were loaded into the planning software.  Then the prostate and rectum were contoured.  Treatment planning then occurred.  The implanted iodine 125 seeds were identified by the physics staff for projection of radiation distribution  I have requested : 3D Simulation  I have requested a DVH of the following structures: Prostate and rectum.    ________________________________  Sheral Apley Tammi Klippel, M.D.

## 2020-12-17 NOTE — Progress Notes (Signed)
Patient states doing well. No symptoms reported at this time. I-PSS score of 2 (mild). Meaningful use complete.  Currently on Flomax 0.4mg . Urology follow-up scheduled for January 6th, 2023 -per [facility].  BP (!) 163/121 (BP Location: Left Arm, Patient Position: Sitting, Cuff Size: Large)   Pulse 95   Temp 97.9 F (36.6 C)   Resp 20   Ht 5\' 9"  (1.753 m)   Wt 199 lb (90.3 kg)   SpO2 99%   BMI 29.39 kg/m

## 2020-12-17 NOTE — Progress Notes (Signed)
Radiation Oncology         [phone]) [phone] ________________________________  Name: Benjamin Hines MRN: [id number]  Date: 12/17/2020  DOB: Dec 21, 1960  Post-Seed Follow-Up Visit Note  CC: Eulas Post, MD  Robley Fries, MD  Diagnosis:   60 y.o. gentleman with Stage T1c adenocarcinoma of the prostate with Gleason score of 3+4, and PSA of 7.58.    ICD-10-CM   1. Malignant neoplasm of prostate ([place])  [facility] referral to Social Work      Interval Since Last Radiation:  3 weeks 11/26/20:  Insertion of radioactive I-125 seeds into the prostate gland; 145 Gy, definitive therapy with placement of SpaceOAR gel.  Narrative:  The patient returns today for routine follow-up.  He is complaining of increased urinary frequency and urinary hesitation symptoms. He filled out a questionnaire regarding urinary function today providing and overall IPSS score of 2 characterizing his symptoms as mild.  His pre-implant score was 1. He denies any abdominal pain or bowel symptoms.  He reports a healthy appetite and is maintaining his weight.  He has not noticed any significant impact on his stamina/energy level and overall, is quite pleased with his progress to date.  ALLERGIES:  has No Known Allergies.  Meds: Current Outpatient Medications  Medication Sig Dispense Refill   amLODipine (NORVASC) 10 MG tablet Take 1 tablet (10 mg total) by mouth daily. (Patient taking differently: Take 10 mg by mouth daily.) 30 tablet 0   atorvastatin (LIPITOR) 40 MG tablet Take 1 tablet (40 mg total) by mouth daily. 30 tablet 0   cetirizine (ZYRTEC) 10 MG tablet Take 10 mg by mouth at bedtime as needed.     fluticasone (FLONASE) 50 MCG/ACT nasal spray Place 1 spray into both nostrils daily as needed.     tamsulosin (FLOMAX) 0.4 MG CAPS capsule Take 0.4 mg by mouth daily.     thiamine (VITAMIN B-1) 100 MG tablet Take 100 mg by mouth daily.     traMADol (ULTRAM) 50 MG tablet Take 1 tablet (50 mg total) by mouth  every 6 (six) hours as needed. 20 tablet 0   valsartan-hydrochlorothiazide (DIOVAN-HCT) 80-12.5 MG tablet Take 1 tablet by mouth every morning. (Patient taking differently: Take 1 tablet by mouth every morning.) 30 tablet 0   No current facility-administered medications for this encounter.    Physical Findings: In general this is a well appearing African-American male in no acute distress. He's alert and oriented x4 and appropriate throughout the examination. Cardiopulmonary assessment is negative for acute distress and he exhibits normal effort.   Lab Findings: Lab Results  Component Value Date   WBC 7.5 11/22/2020   HGB 14.8 11/22/2020   HCT 44.7 11/22/2020   MCV 91.2 11/22/2020   PLT 241 11/22/2020    Radiographic Findings:  Patient underwent CT imaging in our clinic for post implant dosimetry. The CT will be reviewed by Dr. Tammi Klippel to confirm there is an adequate distribution of radioactive seeds throughout the prostate gland and ensure that there are no seeds in or near the rectum.  We suspect the final radiation plan and dosimetry will show appropriate coverage of the prostate gland. He understands that we will call and inform him of any unexpected findings on further review of his imaging and dosimetry.  Impression/Plan: 61 y.o. gentleman with Stage T1c adenocarcinoma of the prostate with Gleason score of 3+4, and PSA of 7.58. The patient is recovering from the effects of radiation. His urinary symptoms should gradually improve  over the next 4-6 months. We talked about this today. He is encouraged by his improvement already and is otherwise pleased with his outcome. We also talked about long-term follow-up for prostate cancer following seed implant. He understands that ongoing PSA determinations and digital rectal exams will help perform surveillance to rule out disease recurrence. He has a follow up appointment scheduled with Dr. Claudia Desanctis on 01/17/2021. He understands what to expect with his  PSA measures. Patient was also educated today about some of the long-term effects from radiation including a small risk for rectal bleeding and possibly erectile dysfunction. We talked about some of the general management approaches to these potential complications. However, I did encourage the patient to contact our office or return at any point if he has questions or concerns related to his previous radiation and prostate cancer.    Nicholos Johns, PA-C

## 2020-12-30 ENCOUNTER — Encounter: Payer: Self-pay | Admitting: Radiation Oncology

## 2020-12-30 DIAGNOSIS — C61 Malignant neoplasm of prostate: Secondary | ICD-10-CM | POA: Insufficient documentation

## 2021-01-13 NOTE — Progress Notes (Signed)
°  Radiation Oncology         [phone]) [phone] ________________________________  Name: MARJORIE LUSSIER MRN: [id number]  Date: 12/30/2020  DOB: 06/25/1960  3D Planning Note   Prostate Brachytherapy Post-Implant Dosimetry  Diagnosis: 61 y.o. gentleman with Stage T1c adenocarcinoma of the prostate with Gleason score of 3+4, and PSA of 7.58.  Narrative: On a previous date, Benjamin Hines returned following prostate seed implantation for post implant planning. He underwent CT scan complex simulation to delineate the three-dimensional structures of the pelvis and demonstrate the radiation distribution.  Since that time, the seed localization, and complex isodose planning with dose volume histograms have now been completed.  Results:   Prostate Coverage - The dose of radiation delivered to the 90% or more of the prostate gland (D90) was 94.32% of the prescription dose. This exceeds our goal of greater than 90%. Rectal Sparing - The volume of rectal tissue receiving the prescription dose or higher was 0.0 cc. This falls under our thresholds tolerance of 1.0 cc.  Impression: The prostate seed implant appears to show adequate target coverage and appropriate rectal sparing.  Plan:  The patient will continue to follow with urology for ongoing PSA determinations. I would anticipate a high likelihood for local tumor control with minimal risk for rectal morbidity.  ________________________________  Sheral Apley Tammi Klippel, M.D.

## 2021-01-28 ENCOUNTER — Ambulatory Visit: Payer: 59 | Admitting: Vascular Surgery

## 2021-01-28 ENCOUNTER — Inpatient Hospital Stay (HOSPITAL_COMMUNITY): Admission: RE | Admit: 2021-01-28 | Payer: 59 | Source: Ambulatory Visit

## 2021-01-31 ENCOUNTER — Telehealth: Payer: Self-pay | Admitting: *Deleted

## 2021-02-03 ENCOUNTER — Telehealth: Payer: Self-pay | Admitting: *Deleted

## 2021-02-07 ENCOUNTER — Telehealth: Payer: Self-pay | Admitting: *Deleted

## 2021-02-14 ENCOUNTER — Inpatient Hospital Stay: Payer: 59 | Attending: Adult Health | Admitting: *Deleted

## 2021-02-14 ENCOUNTER — Encounter: Payer: Self-pay | Admitting: *Deleted

## 2021-02-14 DIAGNOSIS — C61 Malignant neoplasm of prostate: Secondary | ICD-10-CM

## 2021-02-14 NOTE — Progress Notes (Signed)
°   2 Identifiers used during this visit for verification purposes. SCP reviewed and completed. SDOH assessed and completed.  Medications were reviewed and updated. No vitals signs were taken as this was a telephone visit. Pain denies pain.  Pt has seen PCP last month. Colonoscopy up to date 07/26/2020. He will repeat in 5 years. Pt does not take the flu vaccine or the Covid vaccines. Pt does not exercise on a regular basis, but says he is active.  Pt does still have some daytime urgency with urination, but says his flow is strong. He sleeps well, only getting up once to bathroom. No burning with urination and bowels are normal. Pt has never seen a dermatologist for any reason. Pt has a 30-pack year history of smoking ;therefore, an order has been sent for lung ca screening and pt was agreeable with this. Pt has not returned for an appt with Dr. Claudia Desanctis for PSA determinations. His new insurance is not part of the [facility] network. A referral will be sent from Dr. Claudia Desanctis to [facility] in [place] for care to be transferred. Pt will be called from [facility] once this takes place. Pt was fine with this change.

## 2021-02-18 ENCOUNTER — Ambulatory Visit: Payer: 59 | Admitting: Vascular Surgery

## 2021-02-18 ENCOUNTER — Other Ambulatory Visit (HOSPITAL_COMMUNITY): Payer: 59

## 2021-02-28 ENCOUNTER — Other Ambulatory Visit: Payer: Self-pay | Admitting: Adult Health

## 2021-03-03 NOTE — Progress Notes (Incomplete)
03/03/21 7:53 AM   [name] Mar 07, 1960 [id number]  Referring provider:  Robley Fries, MD [place],  [place] [address] No chief complaint on file.    HPI: Benjamin Hines is a 61 y.o.male who presents today for further evaluation of prostate cancer.  He was previously followed by [facility].   He underwent prostate biopsy on 08/12/2020 PSA before biopsy was 7.5. Pathology was consistent with Gleason 3+4 in 20% involving 1 core in the right mid gland. TRUS 31.86.   He underwent brachytherapy with SpaceOAR in 11/26/2020. He is followed by Dr. Hulan Fray in urology.   He has a personal history of ongoing perineal pain.    He also has a personal history of ED he experiences headaches on PDE 5 inhibitors.   He is currently on tamsulosin.   PMH: Past Medical History:  Diagnosis Date   AAA (abdominal aortic aneurysm) without rupture 12/27/2017   followed by vascular-- dr c. Carlis Abbott,  last duplex in epic 07-23-2020 4.6cm   Arthritis    Chronic combined systolic and diastolic CHF (congestive heart failure) ([place]) 12/2017   Hyperlipidemia    Hypertension    followed by pcp   Nocturia    OSA (obstructive sleep apnea)    pulmonology --- dr Halford Chessman----  (11-21-2020  pt stated has not used cpap in 6 months , intolerate)    Surgical History: Past Surgical History:  Procedure Laterality Date   COLONOSCOPY  07/26/2020   by dr stark   CYSTOSCOPY  11/26/2020   Procedure: Erlene Quan;  Surgeon: Robley Fries, MD;  [facility];  Service: Urology;;   FOOT SURGERY Left 2002   fracture  (pt stated no hardware)   RADIOACTIVE SEED IMPLANT N/A 11/26/2020   Procedure: RADIOACTIVE SEED IMPLANT/BRACHYTHERAPY IMPLANT;  Surgeon: Robley Fries, MD;  Location: [facility];  Service: Urology;  Laterality: N/A;   SPACE OAR INSTILLATION N/A 11/26/2020   Procedure: SPACE OAR INSTILLATION;  Surgeon: Robley Fries, MD;  Location: [facility];  Service: Urology;  Laterality: N/A;    Home Medications:  Allergies as of 03/04/2021   No Known Allergies      Medication List        Accurate as of March 03, 2021  7:53 AM. If you have any questions, ask your nurse or doctor.          amLODipine 10 MG tablet Commonly known as: NORVASC Take 1 tablet (10 mg total) by mouth daily.   atorvastatin 40 MG tablet Commonly known as: LIPITOR Take 1 tablet (40 mg total) by mouth daily.   cetirizine 10 MG tablet Commonly known as: ZYRTEC Take 10 mg by mouth at bedtime as needed.   fluticasone 50 MCG/ACT nasal spray Commonly known as: FLONASE Place 1 spray into both nostrils daily as needed.   tamsulosin 0.4 MG Caps capsule Commonly known as: FLOMAX Take 0.4 mg by mouth daily.   thiamine 100 MG tablet Commonly known as: Vitamin B-1 Take 100 mg by mouth daily.   valsartan-hydrochlorothiazide 80-12.5 MG tablet Commonly known as: DIOVAN-HCT Take 1 tablet by mouth every morning.        Allergies: No Known Allergies  Family History: Family History  Problem Relation Age of Onset   Dementia Mother    Hypertension Mother    CAD Father    Colon cancer Neg Hx    Colon polyps Neg Hx  Esophageal cancer Neg Hx    Rectal cancer Neg Hx    Stomach cancer Neg Hx     Social History:  reports that he has quit smoking. His smoking use included cigarettes and cigars. He has a 30.00 pack-year smoking history. He has never used smokeless tobacco. He reports that he does not currently use alcohol after a past usage of about 21.0 standard drinks per week. He reports that he does not use drugs.   Physical Exam: There were no vitals taken for this visit.  Constitutional:  Alert and oriented, No acute distress. HEENT: [place] AT, moist mucus membranes.  Trachea midline, no masses. Cardiovascular: No clubbing, cyanosis, or edema. Respiratory: Normal respiratory effort, no increased work  of breathing. Skin: No rashes, bruises or suspicious lesions. Neurologic: Grossly intact, no focal deficits, moving all 4 extremities. Psychiatric: Normal mood and affect.  Laboratory Data: Lab Results  Component Value Date   CREATININE 0.87 11/22/2020   Lab Results  Component Value Date   PSA 6.32 (H) 06/25/2020   PSA 5.82 (H) 03/06/2020   PSA 1.35 11/12/2014   Urinalysis   Pertinent Imaging:  Assessment & Plan:     No follow-ups on file.  I,Kailey Littlejohn,acting as a Education administrator for Hollice Espy, MD.,have documented all relevant documentation on the behalf of Hollice Espy, MD,as directed by  Hollice Espy, MD while in the presence of Hollice Espy, [place] [address] [place],  [address] [phone]

## 2021-03-04 ENCOUNTER — Ambulatory Visit: Payer: 59 | Admitting: Urology

## 2021-03-11 ENCOUNTER — Ambulatory Visit (HOSPITAL_COMMUNITY)
Admission: RE | Admit: 2021-03-11 | Discharge: 2021-03-11 | Disposition: A | Discharge status: Home / Self Care | Payer: 59 | Source: Ambulatory Visit | Attending: Vascular Surgery | Admitting: Vascular Surgery

## 2021-03-11 ENCOUNTER — Other Ambulatory Visit: Payer: Self-pay

## 2021-03-11 ENCOUNTER — Encounter: Payer: Self-pay | Admitting: Vascular Surgery

## 2021-03-11 ENCOUNTER — Ambulatory Visit (INDEPENDENT_AMBULATORY_CARE_PROVIDER_SITE_OTHER): Payer: 59 | Admitting: Vascular Surgery

## 2021-03-11 VITALS — BP 147/101 | HR 83 | Temp 97.9°F | Resp 18 | Ht 69.0 in | Wt 187.0 lb

## 2021-03-11 DIAGNOSIS — I714 Abdominal aortic aneurysm, without rupture, unspecified: Secondary | ICD-10-CM | POA: Diagnosis present

## 2021-03-11 DIAGNOSIS — I7143 Infrarenal abdominal aortic aneurysm, without rupture: Secondary | ICD-10-CM | POA: Diagnosis not present

## 2021-03-11 NOTE — Progress Notes (Signed)
Patient name: Benjamin Hines MRN: [id number] DOB: [birth year] Sex: male  REASON FOR VISIT: 46 month follow-up for AAA  HPI: Benjamin Hines is a 61 y.o. male who presents for 6 month follow-up of AAA after previously being evaluated in the hospital for stranding in his supra visceral aorta.  At the time he presented with abdominal pain and a CT was obtained in the ED that showed some stranding around the aorta.  Ultimately underwent a negative infectious work-up including negative blood cultures.  He got a repeat CT scan at 1 month follow-up after hospital discharge in 01/2018 that showed no further stranding.  He was last seen 1 year ago and maximal proximal aortic diameter was  4.6 x 5 cm.  He reports no abdominal or back pain.  Recently had prostate surgery.  Past Medical History:  Diagnosis Date   AAA (abdominal aortic aneurysm) without rupture 12/27/2017   followed by vascular-- dr c. Carlis Abbott,  last duplex in epic 07-23-2020 4.6cm   Arthritis    Chronic combined systolic and diastolic CHF (congestive heart failure) ([place]) 12/2017   Hyperlipidemia    Hypertension    followed by pcp   Nocturia    OSA (obstructive sleep apnea)    pulmonology --- dr Halford Chessman----  (11-21-2020  pt stated has not used cpap in 6 months , intolerate)    Past Surgical History:  Procedure Laterality Date   COLONOSCOPY  07/26/2020   by dr stark   CYSTOSCOPY  11/26/2020   Procedure: Erlene Quan;  Surgeon: Robley Fries, MD;  Location: [facility];  Service: Urology;;   FOOT SURGERY Left 2002   fracture  (pt stated no hardware)   RADIOACTIVE SEED IMPLANT N/A 11/26/2020   Procedure: RADIOACTIVE SEED IMPLANT/BRACHYTHERAPY IMPLANT;  Surgeon: Robley Fries, MD;  Location: [facility];  Service: Urology;  Laterality: N/A;   SPACE OAR INSTILLATION N/A 11/26/2020   Procedure: SPACE OAR INSTILLATION;  Surgeon: Robley Fries, MD;  [facility];   Service: Urology;  Laterality: N/A;    Family History  Problem Relation Age of Onset   Dementia Mother    Hypertension Mother    CAD Father    Colon cancer Neg Hx    Colon polyps Neg Hx    Esophageal cancer Neg Hx    Rectal cancer Neg Hx    Stomach cancer Neg Hx     SOCIAL HISTORY: Social History   Tobacco Use   Smoking status: Former    Packs/day: 1.00    Years: 30.00    Pack years: 30.00    Types: Cigarettes, Cigars   Smokeless tobacco: Never   Tobacco comments:    11-21-2020  pt stated stopped smoking cigarettes 2012 for 20 yrs ,  since then occasional cigar  Substance Use Topics   Alcohol use: Not Currently    Alcohol/week: 21.0 standard drinks    Types: 21 Cans of beer per week    Comment: 11-21-2020 pt stated 3 beers daily (12 oz)    No Known Allergies  Current Outpatient Medications  Medication Sig Dispense Refill   amLODipine (NORVASC) 10 MG tablet Take 1 tablet (10 mg total) by mouth daily. (Patient taking differently: Take 10 mg by mouth daily.) 30 tablet 0   atorvastatin (LIPITOR) 40 MG tablet Take 1 tablet (40 mg total) by mouth daily. 30 tablet 0   cetirizine (ZYRTEC) 10 MG tablet Take 10 mg by mouth at bedtime as  needed.     fluticasone (FLONASE) 50 MCG/ACT nasal spray Place 1 spray into both nostrils daily as needed.     tamsulosin (FLOMAX) 0.4 MG CAPS capsule Take 0.4 mg by mouth daily.     thiamine (VITAMIN B-1) 100 MG tablet Take 100 mg by mouth daily.     valsartan-hydrochlorothiazide (DIOVAN-HCT) 80-12.5 MG tablet Take 1 tablet by mouth every morning. (Patient taking differently: Take 1 tablet by mouth every morning.) 30 tablet 0   No current facility-administered medications for this visit.    REVIEW OF SYSTEMS:  [X]  denotes positive finding, [ ]  denotes negative finding Cardiac  Comments:  Chest pain or chest pressure:    Shortness of breath upon exertion:    Short of breath when lying flat:    Irregular heart rhythm:        Vascular     Pain in calf, thigh, or hip brought on by ambulation:    Pain in feet at night that wakes you up from your sleep:     Blood clot in your veins:    Leg swelling:         Pulmonary    Oxygen at home:    Productive cough:     Wheezing:         Neurologic    Sudden weakness in arms or legs:     Sudden numbness in arms or legs:     Sudden onset of difficulty speaking or slurred speech:    Temporary loss of vision in one eye:     Problems with dizziness:         Gastrointestinal    Blood in stool:     Vomited blood:         Genitourinary    Burning when urinating:     Blood in urine:        Psychiatric    Major depression:         Hematologic    Bleeding problems:    Problems with blood clotting too easily:        Skin    Rashes or ulcers:        Constitutional    Fever or chills:      PHYSICAL EXAM: Vitals:   03/11/21 0901  BP: (!) 147/101  Pulse: 83  Resp: 18  Temp: 97.9 F (36.6 C)  TempSrc: Temporal  SpO2: 96%  Weight: 187 lb (84.8 kg)  Height: 5\' 9"  (1.753 m)    GENERAL: The patient is a well-nourished male, in no acute distress. The vital signs are documented above. CARDIAC: There is a regular rate and rhythm.  VASCULAR:  2+ femoral pulse palpable bilateral groins 2+ dorsalis pedis pulses palpable bilateral lower extremities PULMONARY: No respiratory distress. ABDOMEN: Soft and non-tender.   MUSCULOSKELETAL: There are no major deformities or cyanosis. NEUROLOGIC: No focal weakness or paresthesias are detected.   DATA:   AAA duplex today shows maximal proximal aortic diameter of 4.9 x 5.2 cm (previously 5.0 x 4.6 cm approximately 6 months ago)    Assessment/Plan:  61 year old male presents for 6 month follow-up for ongoing surveillance of his suprarenal aorta previously measuring 4.6 x 5 cm.  He was initially evaluated for aortitis in 2020 and I was initially concerned with possible infection.  Ultimately his infectious work-up was negative and  repeat scan with CT in 2020 showed resolution of the stranding.  We are following this area for aneurysmal degeneration.  This has increased in size in  the proximal aorta from 4.6 x 5 cm to 4.9 x 5.2 cm today.  He continues to have no symptoms.  Discussed I will see him again in 6 months with a AAA duplex here in the office.  Discussed that in the absence of symptoms or rapid growth current guidelines to repair these are greater than 5.5 cm.   [name], MD [facility] Office: [phone]

## 2021-03-12 NOTE — Progress Notes (Signed)
? ?03/13/21 ?9:25 AM  ? ?Benjamin Hines ?October 16, 1960 ?[id number] ? ?Referring provider:  ?Robley Fries, MD ?[address] ?[address] ?[place],   [id number] ?Chief Complaint  ?Patient presents with  ? New Patient (Initial Visit)  ? Prostate Cancer  ? ? ? ?HPI: ?Benjamin Hines is a 61 y.o.male who presents today to establish care due to insurance reasons. ? ?He was previously followed by Dr. Claudia Desanctis at [facility].  ? ?He had a personal history of elevated PSA that had risen to 7.5. He underwent a prostate biopsy on 08/12/2020. Pathology reported showed Gleason 3+4 involving 1 of 12 cores in 20% in the right mid gland. He also has a personal history of ED and he tried PDE 5 inhibitors but had effects of headache. He has a TRUS size of 31.86g. ? ?He is s/p brachytherapy with SpaceOAR  with Dr.Pace on 11/26/2020. He is followed by radiation oncology.   He denies receiving ADT and no documentation indicating that he received it.   ? ?During last visit with Dr. Claudia Desanctis she increased his Flomax to 0.4.  he has cut back down to  and is now back on 0.4 mg tamsulosin with gradual improvement of his urinary symptoms. ? ?He reports that Flomax helped with his urinary symptoms he reports intermittently he has some discomfort where the seeds were inserted.  ? ?He has ED and has tried Viagra before. He had a partial erection on Cialis.  ? ?He reports that he smokes cigars x2 weekly and questions how harmful this might be to his health.   ? ?PMH: ?Past Medical History:  ?Diagnosis Date  ? AAA (abdominal aortic aneurysm) without rupture 12/27/2017  ? followed by vascular-- dr c. Carlis Abbott,  last duplex in epic 07-23-2020 4.6cm  ? Arthritis   ? Chronic combined systolic and diastolic CHF (congestive heart failure) ([place]) 12/2017  ? Hyperlipidemia   ? Hypertension   ? followed by pcp  ? Nocturia   ? OSA (obstructive sleep apnea)   ? pulmonology --- dr Halford Chessman----  (11-21-2020  pt stated has not used cpap in 6 months , intolerate)   ? ? ?Surgical History: ?Past Surgical History:  ?Procedure Laterality Date  ? COLONOSCOPY  07/26/2020  ? by dr stark  ? CYSTOSCOPY  11/26/2020  ? Procedure: CYSTOSCOPY FLEXIBLE;  Surgeon: Robley Fries, MD;  Location: [facility];  Service: Urology;;  ? FOOT SURGERY Left 2002  ? fracture  (pt stated no hardware)  ? RADIOACTIVE SEED IMPLANT N/A 11/26/2020  ? Procedure: RADIOACTIVE SEED IMPLANT/BRACHYTHERAPY IMPLANT;  Surgeon: Robley Fries, MD;  Location: [facility];  Service: Urology;  Laterality: N/A;  ? SPACE OAR INSTILLATION N/A 11/26/2020  ? Procedure: SPACE OAR INSTILLATION;  Surgeon: Robley Fries, MD;  Location: [facility];  Service: Urology;  Laterality: N/A;  ? ? ?Home Medications:  ?Allergies as of 03/13/2021   ?No Known Allergies ?  ? ?  ?Medication List  ?  ? ?  ? Accurate as of March 13, 2021  9:25 AM. If you have any questions, ask your nurse or doctor.  ?  ?  ? ?  ? ?amLODipine 10 MG tablet ?Commonly known as: NORVASC ?Take 1 tablet (10 mg total) by mouth daily. ?  ?atorvastatin 40 MG tablet ?Commonly known as: LIPITOR ?Take 1 tablet (40 mg total) by mouth daily. ?  ?cetirizine 10 MG tablet ?Commonly known as: ZYRTEC ?Take 10 mg by mouth  at bedtime as needed. ?  ?fluticasone 50 MCG/ACT nasal spray ?Commonly known as: FLONASE ?Place 1 spray into both nostrils daily as needed. ?  ?tamsulosin 0.4 MG Caps capsule ?Commonly known as: FLOMAX ?Take 0.4 mg by mouth daily. ?  ?thiamine 100 MG tablet ?Commonly known as: Vitamin B-1 ?Take 100 mg by mouth daily. ?  ?valsartan-hydrochlorothiazide 80-12.5 MG tablet ?Commonly known as: DIOVAN-HCT ?Take 1 tablet by mouth every morning. ?  ? ?  ? ? ?Allergies: No Known Allergies ? ?Family History: ?Family History  ?Problem Relation Age of Onset  ? Dementia Mother   ? Hypertension Mother   ? CAD Father   ? Colon cancer Neg Hx   ? Colon polyps Neg Hx   ? Esophageal cancer Neg Hx   ? Rectal cancer Neg Hx   ?  Stomach cancer Neg Hx   ? ? ?Social History:  reports that he has quit smoking. His smoking use included cigarettes and cigars. He has a 30.00 pack-year smoking history. He has been exposed to tobacco smoke. He has never used smokeless tobacco. He reports that he does not currently use alcohol after a past usage of about 21.0 standard drinks per week. He reports that he does not use drugs. ? ? ?Physical Exam: ?BP (!) 154/105   Pulse 91   Ht 5\' 9"  (1.753 m)   Wt 183 lb (83 kg)   BMI 27.02 kg/m?   ?Constitutional:  Alert and oriented, No acute distress. ?HEENT: [place] AT, moist mucus membranes.  Trachea midline, no masses. ?Cardiovascular: No clubbing, cyanosis, or edema. ?Respiratory: Normal respiratory effort, no increased work of breathing. ?Skin: No rashes, bruises or suspicious lesions. ?Neurologic: Grossly intact, no focal deficits, moving all 4 extremities. ?Psychiatric: Normal mood and affect. ? ?Laboratory Data: ? ?Lab Results  ?Component Value Date  ? CREATININE 0.87 11/22/2020  ? ? ?Assessment & Plan:   ?Prostate cancer  ?- s/p brachytherapy with Space[facility]AR  with Dr.Pace on 11/26/2020. ?-PSA today  ?- PSA; future  ? ?2. Urinary frequency  ?- Had improvement on Flomax  ?- Continue Flomax 0.4 mg ? ?3. ED  ?- He previously failed PDE5 inhibitors. We discussed different treatment options such as Trimix injection versus penile implant. He would like to try Cialis again.  ?- Cialis; prescribed  ?-He will call if he would like to peruse injection teaching/ titration ? ?4. Smoking cessation counseling ?- Discussed how smoking can effect urinary symptoms and the importance of cessation ? ?Return in 6 months for PSA  ? ?I,Kailey Littlejohn,acting as a Education administrator for Hollice Espy, MD.,have documented all relevant documentation on the behalf of Hollice Espy, MD,as directed by  Hollice Espy, MD while in the presence of Hollice Espy, MD. ? ?I have reviewed the above documentation for accuracy and completeness, and I  agree with the above.  ? ?Hollice Espy, MD ? ? ?[facility] ?[address] ?[place], [place] [address] ?[phone] ? ?

## 2021-03-13 ENCOUNTER — Encounter: Payer: Self-pay | Admitting: Urology

## 2021-03-13 ENCOUNTER — Other Ambulatory Visit: Payer: Self-pay

## 2021-03-13 ENCOUNTER — Ambulatory Visit: Payer: 59 | Admitting: Urology

## 2021-03-13 VITALS — BP 154/105 | HR 91 | Ht 69.0 in | Wt 183.0 lb

## 2021-03-13 DIAGNOSIS — C61 Malignant neoplasm of prostate: Secondary | ICD-10-CM

## 2021-03-13 MED ORDER — TADALAFIL 5 MG PO TABS: 5.0000 mg | ORAL_TABLET | Freq: Every day | ORAL | 11 refills | Status: DC | PRN

## 2021-03-14 ENCOUNTER — Ambulatory Visit (HOSPITAL_COMMUNITY)
Admission: RE | Admit: 2021-03-14 | Discharge: 2021-03-14 | Disposition: A | Discharge status: Home / Self Care | Payer: 59 | Source: Ambulatory Visit | Attending: Adult Health | Admitting: Adult Health

## 2021-03-14 ENCOUNTER — Other Ambulatory Visit: Payer: Self-pay | Admitting: *Deleted

## 2021-03-14 DIAGNOSIS — I714 Abdominal aortic aneurysm, without rupture, unspecified: Secondary | ICD-10-CM

## 2021-03-14 DIAGNOSIS — C61 Malignant neoplasm of prostate: Secondary | ICD-10-CM | POA: Diagnosis present

## 2021-03-14 LAB — PSA: Prostate Specific Ag, Serum: 4.6 ng/mL — ABNORMAL HIGH (ref 0.0–4.0)

## 2021-03-14 LAB — URINALYSIS, COMPLETE
Bilirubin, UA: NEGATIVE
Glucose, UA: NEGATIVE
Ketones, UA: NEGATIVE
Leukocytes,UA: NEGATIVE
Nitrite, UA: NEGATIVE
Protein,UA: NEGATIVE
RBC, UA: NEGATIVE
Specific Gravity, UA: 1.025 (ref 1.005–1.030)
Urobilinogen, Ur: 0.2 mg/dL (ref 0.2–1.0)
pH, UA: 6 (ref 5.0–7.5)

## 2021-03-14 LAB — MICROSCOPIC EXAMINATION
Bacteria, UA: NONE SEEN
Epithelial Cells (non renal): NONE SEEN /hpf (ref 0–10)
RBC, Urine: NONE SEEN /hpf (ref 0–2)
WBC, UA: NONE SEEN /hpf (ref 0–5)

## 2021-03-18 ENCOUNTER — Telehealth: Payer: Self-pay

## 2021-03-18 NOTE — Telephone Encounter (Signed)
Pt aware and verbalized understanding.  

## 2021-03-18 NOTE — Telephone Encounter (Signed)
-----   Message from Hollice Espy, MD sent at 03/17/2021  4:56 PM EST ----- ?PSA is 4.6, anticipate this will likely be lower we will recheck again in 6 months. ? ?Hollice Espy, MD ? ?

## 2021-03-19 ENCOUNTER — Telehealth: Payer: Self-pay

## 2021-03-19 NOTE — Telephone Encounter (Signed)
-----   Message from Gardenia Phlegm, NP sent at 03/19/2021 10:08 AM EST ----- ?CT scan is negative please let patient know, recommend repeating in 1 year. ?----- Message ----- ?From: Interface, Rad Results In ?Sent: 03/16/2021   7:14 AM EST ?To: Gardenia Phlegm, NP ? ? ?

## 2021-03-19 NOTE — Telephone Encounter (Signed)
Called and left VM for pt regarding negative CT scan.  Advised pt return call with additional questions or concerns.   ?

## 2021-07-01 ENCOUNTER — Ambulatory Visit: Payer: 59 | Admitting: Family Medicine

## 2021-07-04 ENCOUNTER — Ambulatory Visit: Payer: 59 | Admitting: Family Medicine

## 2021-07-09 ENCOUNTER — Ambulatory Visit: Payer: 59 | Admitting: Family Medicine

## 2021-09-18 ENCOUNTER — Other Ambulatory Visit: Payer: Self-pay

## 2021-09-23 ENCOUNTER — Ambulatory Visit: Payer: 59 | Admitting: Urology

## 2021-09-24 ENCOUNTER — Other Ambulatory Visit: Payer: Self-pay

## 2021-09-30 ENCOUNTER — Ambulatory Visit: Payer: Self-pay | Admitting: Urology

## 2021-10-01 ENCOUNTER — Other Ambulatory Visit: Payer: Self-pay

## 2021-10-07 ENCOUNTER — Ambulatory Visit: Payer: Self-pay | Admitting: Urology

## 2021-10-28 ENCOUNTER — Ambulatory Visit: Payer: Self-pay | Admitting: Urology

## 2021-11-18 ENCOUNTER — Other Ambulatory Visit (HOSPITAL_COMMUNITY): Payer: Self-pay

## 2021-11-18 ENCOUNTER — Ambulatory Visit: Payer: Self-pay | Admitting: Vascular Surgery

## 2021-11-25 ENCOUNTER — Ambulatory Visit (HOSPITAL_COMMUNITY): Payer: Commercial Managed Care - HMO | Attending: Vascular Surgery

## 2021-11-25 ENCOUNTER — Ambulatory Visit: Payer: Self-pay | Admitting: Vascular Surgery

## 2022-02-17 ENCOUNTER — Ambulatory Visit (HOSPITAL_COMMUNITY)
Admission: RE | Admit: 2022-02-17 | Discharge: 2022-02-17 | Disposition: A | Discharge status: Home / Self Care | Payer: Commercial Managed Care - HMO | Source: Ambulatory Visit | Attending: Vascular Surgery | Admitting: Vascular Surgery

## 2022-02-17 ENCOUNTER — Encounter: Payer: Self-pay | Admitting: Vascular Surgery

## 2022-02-17 ENCOUNTER — Ambulatory Visit: Payer: Commercial Managed Care - HMO | Admitting: Vascular Surgery

## 2022-02-17 VITALS — BP 164/116 | HR 72 | Temp 98.0°F | Resp 18 | Ht 69.0 in | Wt 182.0 lb

## 2022-02-17 DIAGNOSIS — I7143 Infrarenal abdominal aortic aneurysm, without rupture: Secondary | ICD-10-CM | POA: Diagnosis not present

## 2022-02-17 DIAGNOSIS — I714 Abdominal aortic aneurysm, without rupture, unspecified: Secondary | ICD-10-CM

## 2022-02-17 NOTE — Progress Notes (Signed)
Patient name: Benjamin Hines MRN: [id number] DOB: 1960-02-25 Sex: male  REASON FOR VISIT: 90 month follow-up for AAA  HPI: Benjamin Hines is a 62 y.o. male with hx HTN, HLD, CHF, tobacco abuse (cigar) who presents for 6 month follow-up of AAA after previously being evaluated in the hospital for stranding in his supra visceral aorta.  At the time he presented with abdominal pain and a CT was obtained in the ED that showed some stranding around the aorta and he was seen in consultation on 12/27/17.  Ultimately underwent a negative infectious work-up including negative blood cultures.  He got a repeat CT scan at 1 month follow-up after hospital discharge in 01/2018 that showed no further stranding.  Last seen 6 months ago and AAA measured 5.2 cm.  Past Medical History:  Diagnosis Date   AAA (abdominal aortic aneurysm) without rupture ([place]) 12/27/2017   followed by vascular-- dr c. Carlis Abbott,  last duplex in epic 07-23-2020 4.6cm   Arthritis    Chronic combined systolic and diastolic CHF (congestive heart failure) ([place]) 12/2017   Hyperlipidemia    Hypertension    followed by pcp   Nocturia    OSA (obstructive sleep apnea)    pulmonology --- dr Halford Chessman----  (11-21-2020  pt stated has not used cpap in 6 months , intolerate)    Past Surgical History:  Procedure Laterality Date   COLONOSCOPY  07/26/2020   by dr stark   CYSTOSCOPY  11/26/2020   Procedure: Erlene Quan;  Surgeon: Robley Fries, MD;  Location: [facility];  Service: Urology;;   FOOT SURGERY Left 2002   fracture  (pt stated no hardware)   RADIOACTIVE SEED IMPLANT N/A 11/26/2020   Procedure: RADIOACTIVE SEED IMPLANT/BRACHYTHERAPY IMPLANT;  Surgeon: Robley Fries, MD;  Location: [facility];  Service: Urology;  Laterality: N/A;   SPACE OAR INSTILLATION N/A 11/26/2020   Procedure: SPACE OAR INSTILLATION;  Surgeon: Robley Fries, MD;  Location: [facility];  Service:  Urology;  Laterality: N/A;    Family History  Problem Relation Age of Onset   Dementia Mother    Hypertension Mother    CAD Father    Colon cancer Neg Hx    Colon polyps Neg Hx    Esophageal cancer Neg Hx    Rectal cancer Neg Hx    Stomach cancer Neg Hx     SOCIAL HISTORY: Social History   Tobacco Use   Smoking status: Former    Packs/day: 1.00    Years: 30.00    Total pack years: 30.00    Types: Cigarettes, Cigars    Passive exposure: Past   Smokeless tobacco: Never   Tobacco comments:    11-21-2020  pt stated stopped smoking cigarettes 2012 for 20 yrs ,  since then occasional cigar  Substance Use Topics   Alcohol use: Not Currently    Alcohol/week: 21.0 standard drinks of alcohol    Types: 21 Cans of beer per week    Comment: 11-21-2020 pt stated 3 beers daily (12 oz)    No Known Allergies  Current Outpatient Medications  Medication Sig Dispense Refill   amLODipine (NORVASC) 10 MG tablet Take 1 tablet (10 mg total) by mouth daily. 30 tablet 0   atorvastatin (LIPITOR) 40 MG tablet Take 1 tablet (40 mg total) by mouth daily. 30 tablet 0   cetirizine (ZYRTEC) 10 MG tablet Take 10 mg by mouth at bedtime as needed.  fluticasone (FLONASE) 50 MCG/ACT nasal spray Place 1 spray into both nostrils daily as needed.     tadalafil (CIALIS) 5 MG tablet Take 1 tablet (5 mg total) by mouth daily as needed for erectile dysfunction. 30 tablet 11   tamsulosin (FLOMAX) 0.4 MG CAPS capsule Take 0.4 mg by mouth daily.     thiamine (VITAMIN B-1) 100 MG tablet Take 100 mg by mouth daily.     valsartan-hydrochlorothiazide (DIOVAN-HCT) 80-12.5 MG tablet Take 1 tablet by mouth every morning. 30 tablet 0   No current facility-administered medications for this visit.    REVIEW OF SYSTEMS:  '[X]'$  denotes positive finding, '[ ]'$  denotes negative finding Cardiac  Comments:  Chest pain or chest pressure:    Shortness of breath upon exertion:    Short of breath when lying flat:    Irregular  heart rhythm:        Vascular    Pain in calf, thigh, or hip brought on by ambulation:    Pain in feet at night that wakes you up from your sleep:     Blood clot in your veins:    Leg swelling:         Pulmonary    Oxygen at home:    Productive cough:     Wheezing:         Neurologic    Sudden weakness in arms or legs:     Sudden numbness in arms or legs:     Sudden onset of difficulty speaking or slurred speech:    Temporary loss of vision in one eye:     Problems with dizziness:         Gastrointestinal    Blood in stool:     Vomited blood:         Genitourinary    Burning when urinating:     Blood in urine:        Psychiatric    Major depression:         Hematologic    Bleeding problems:    Problems with blood clotting too easily:        Skin    Rashes or ulcers:        Constitutional    Fever or chills:      PHYSICAL EXAM: Vitals:   02/17/22 0822  BP: (!) 164/116  Pulse: 72  Resp: 18  Temp: 98 F (36.7 C)  TempSrc: Temporal  SpO2: 96%  Weight: 182 lb (82.6 kg)  Height: '5\' 9"'$  (1.753 m)    GENERAL: The patient is a well-nourished male, in no acute distress. The vital signs are documented above. CARDIAC: There is a regular rate and rhythm.  VASCULAR:  2+ femoral pulse palpable bilateral groins 2+ dorsalis pedis pulses palpable bilateral lower extremities PULMONARY: No respiratory distress. ABDOMEN: Soft and non-tender.  No pain with deep palpation MUSCULOSKELETAL: There are no major deformities or cyanosis. NEUROLOGIC: No focal weakness or paresthesias are detected.   DATA:   AAA duplex today shows maximal proximal aortic diameter of 5.5 cm (increased from 5.2 cm on 03/11/21)      Assessment/Plan:  62 year old male presents for 6 month follow-up for ongoing surveillance of his AAA previously measuring 5.2 cm.  He was initially evaluated for aortitis in late 2019 and I was initially concerned with possible infection.  Ultimately his infectious  work-up was negative and repeat scan with CT in 2020 showed resolution of the stranding.  We are following this area for aneurysmal  degeneration.  This has increased in size in the proximal aorta from 5.2 cm to 5.5 cm today.  I discussed typically at 5.5 cm we recommend repair of AAA.  I have recommended CTA abdomen pelvis to further evaluate his aneurysm and options for repair.  We briefly discussed stent graft repair versus open surgical repair.  Discussed we will have more information once the CTA is complete.  I will see him in about 3 to 4 weeks after CT scan.  [name], MD [facility] Office: [phone]

## 2022-03-02 ENCOUNTER — Other Ambulatory Visit: Payer: Self-pay

## 2022-03-02 DIAGNOSIS — I714 Abdominal aortic aneurysm, without rupture, unspecified: Secondary | ICD-10-CM

## 2022-03-09 ENCOUNTER — Other Ambulatory Visit: Payer: Self-pay

## 2022-03-09 ENCOUNTER — Ambulatory Visit (HOSPITAL_COMMUNITY): Payer: Commercial Managed Care - HMO

## 2022-03-09 DIAGNOSIS — I7143 Infrarenal abdominal aortic aneurysm, without rupture: Secondary | ICD-10-CM

## 2022-03-13 ENCOUNTER — Other Ambulatory Visit: Payer: Commercial Managed Care - HMO

## 2022-03-17 ENCOUNTER — Ambulatory Visit: Payer: Commercial Managed Care - HMO | Admitting: Vascular Surgery

## 2022-03-18 ENCOUNTER — Other Ambulatory Visit: Payer: Commercial Managed Care - HMO

## 2022-04-06 ENCOUNTER — Other Ambulatory Visit: Payer: Commercial Managed Care - HMO

## 2022-04-14 ENCOUNTER — Ambulatory Visit: Payer: Commercial Managed Care - HMO | Admitting: Vascular Surgery

## 2022-04-20 ENCOUNTER — Inpatient Hospital Stay: Admission: RE | Admit: 2022-04-20 | Payer: Commercial Managed Care - HMO | Source: Ambulatory Visit

## 2022-04-21 ENCOUNTER — Ambulatory Visit: Payer: Commercial Managed Care - HMO | Admitting: Vascular Surgery

## 2022-04-28 ENCOUNTER — Ambulatory Visit: Payer: Commercial Managed Care - HMO | Admitting: Vascular Surgery

## 2022-05-21 ENCOUNTER — Other Ambulatory Visit: Payer: Self-pay | Admitting: Vascular Surgery

## 2022-05-21 DIAGNOSIS — I7143 Infrarenal abdominal aortic aneurysm, without rupture: Secondary | ICD-10-CM

## 2022-05-22 ENCOUNTER — Inpatient Hospital Stay: Admission: RE | Admit: 2022-05-22 | Payer: Commercial Managed Care - HMO | Source: Ambulatory Visit

## 2022-05-26 ENCOUNTER — Ambulatory Visit: Payer: Commercial Managed Care - HMO | Admitting: Vascular Surgery

## 2022-06-24 ENCOUNTER — Other Ambulatory Visit: Payer: Self-pay

## 2022-06-30 ENCOUNTER — Ambulatory Visit: Payer: 59 | Admitting: Vascular Surgery

## 2022-07-07 ENCOUNTER — Ambulatory Visit: Payer: 59 | Admitting: Vascular Surgery

## 2022-07-30 ENCOUNTER — Inpatient Hospital Stay: Admission: RE | Admit: 2022-07-30 | Payer: 59 | Source: Ambulatory Visit

## 2022-08-20 ENCOUNTER — Inpatient Hospital Stay: Admission: RE | Admit: 2022-08-20 | Payer: Self-pay | Source: Ambulatory Visit

## 2022-12-18 NOTE — Progress Notes (Shared)
[facility] - Clinic Note  12/21/2022   CHIEF COMPLAINT Patient presents for Retina Evaluation  HISTORY OF PRESENT ILLNESS: Benjamin Hines is a 62 y.o. male who presents to the clinic today for:  HPI     Retina Evaluation   In left eye.  This started 2 months ago.  Duration of 5 months.  Associated Symptoms Flashes and Floaters (2).  Response to treatment was no improvement.  I, the attending physician,  performed the HPI with the patient and updated documentation appropriately.        Comments   NP referred by Dr. Karie Soda for possible ret tear OS. Pt states 2 mo ago a wrench was dropped on his left eye while working on a vehicle. Pt reports seeing black spots and intermittent FOL OS.      Last edited by Rennis Chris, MD on 12/21/2022 12:28 PM.    Pt is here on the referral of Dr. Claris Gower Abblott for concern of retinal tear OS, he states about 2 months ago, he dropped a wrench on his eye while working on a car, he states about 4 weeks ago, he started seeing fol and floaters in his left eye, he states the floaters have gotten worse since he started seeing them, he states fol are occasional and he cannot induce them or predict when they will appear, pt endorses being hypertensive, but denies having diabetes  Referring physician: Loree Fee [address] [place],  [place] [address]  HISTORICAL INFORMATION:  Selected notes from the MEDICAL RECORD NUMBER Referred by Dr. Audrea Muscat for possible retinal tear OS LEE:  Ocular Hx- PMH-   CURRENT MEDICATIONS: No current outpatient medications on file. (Ophthalmic Drugs)   No current facility-administered medications for this visit. (Ophthalmic Drugs)   Current Outpatient Medications (Other)  Medication Sig   amLODipine (NORVASC) 10 MG tablet Take 1 tablet (10 mg total) by mouth daily.   atorvastatin (LIPITOR) 40 MG tablet Take 1 tablet (40 mg total) by mouth daily.   cetirizine (ZYRTEC)  10 MG tablet Take 10 mg by mouth at bedtime as needed.   fluticasone (FLONASE) 50 MCG/ACT nasal spray Place 1 spray into both nostrils daily as needed.   tadalafil (CIALIS) 5 MG tablet Take 1 tablet (5 mg total) by mouth daily as needed for erectile dysfunction.   tamsulosin (FLOMAX) 0.4 MG CAPS capsule Take 0.4 mg by mouth daily.   thiamine (VITAMIN B-1) 100 MG tablet Take 100 mg by mouth daily.   valsartan-hydrochlorothiazide (DIOVAN-HCT) 80-12.5 MG tablet Take 1 tablet by mouth every morning.   No current facility-administered medications for this visit. (Other)   REVIEW OF SYSTEMS: ROS   Positive for: Cardiovascular, Eyes Negative for: Constitutional, Gastrointestinal, Neurological, Skin, Genitourinary, Musculoskeletal, HENT, Endocrine, Respiratory, Psychiatric, Allergic/Imm, Heme/Lymph Last edited by Thompson Grayer, COT on 12/21/2022  8:06 AM.     ALLERGIES No Known Allergies PAST MEDICAL HISTORY Past Medical History:  Diagnosis Date   AAA (abdominal aortic aneurysm) without rupture (HCC) 12/27/2017   followed by vascular-- dr c. Chestine Spore,  last duplex in epic 07-23-2020 4.6cm   Arthritis    Chronic combined systolic and diastolic CHF (congestive heart failure) (HCC) 12/2017   Hyperlipidemia    Hypertension    followed by pcp   Nocturia    OSA (obstructive sleep apnea)    pulmonology --- dr Craige Cotta----  (11-21-2020  pt stated has not used cpap in 6 months , intolerate)   Past  Surgical History:  Procedure Laterality Date   COLONOSCOPY  07/26/2020   by dr stark   CYSTOSCOPY  11/26/2020   Procedure: CYSTOSCOPY FLEXIBLE;  Surgeon: Noel Christmas, MD;  Location: [facility];  Service: Urology;;   FOOT SURGERY Left 2002   fracture  (pt stated no hardware)   RADIOACTIVE SEED IMPLANT N/A 11/26/2020   Procedure: RADIOACTIVE SEED IMPLANT/BRACHYTHERAPY IMPLANT;  Surgeon: Noel Christmas, MD;  Location: [facility];  Service: Urology;  Laterality:  N/A;   SPACE OAR INSTILLATION N/A 11/26/2020   Procedure: SPACE OAR INSTILLATION;  Surgeon: Noel Christmas, MD;  Location: [facility];  Service: Urology;  Laterality: N/A;   FAMILY HISTORY Family History  Problem Relation Age of Onset   Dementia Mother    Hypertension Mother    CAD Father    Colon cancer Neg Hx    Colon polyps Neg Hx    Esophageal cancer Neg Hx    Rectal cancer Neg Hx    Stomach cancer Neg Hx    SOCIAL HISTORY Social History   Tobacco Use   Smoking status: Former    Current packs/day: 1.00    Average packs/day: 1 pack/day for 30.0 years (30.0 ttl pk-yrs)    Types: Cigarettes, Cigars    Passive exposure: Past   Smokeless tobacco: Never   Tobacco comments:    11-21-2020  pt stated stopped smoking cigarettes 2012 for 20 yrs ,  since then occasional cigar  Vaping Use   Vaping status: Never Used  Substance Use Topics   Alcohol use: Not Currently    Alcohol/week: 21.0 standard drinks of alcohol    Types: 21 Cans of beer per week    Comment: 11-21-2020 pt stated 3 beers daily (12 oz)   Drug use: Never       OPHTHALMIC EXAM:  Base Eye Exam     Visual Acuity (Snellen - Linear)       Right Left   Dist [place] 20/25 +2 20/25 +2   Dist ph [place] NI NI         Tonometry (Tonopen, 8:12 AM)       Right Left   Pressure 18 15         Pupils       Pupils Dark Light Shape React APD   Right PERRL 2 2 Round NR None   Left PERRL 2 2 Round NR None         Visual Fields (Counting fingers)       Left Right    Full Full         Extraocular Movement       Right Left    Full, Ortho Full, Ortho         Neuro/Psych     Oriented x3: Yes   Mood/Affect: Normal         Dilation     Both eyes: 1.0% Mydriacyl, 2.5% Phenylephrine @ 8:13 AM           Slit Lamp and Fundus Exam     Slit Lamp Exam       Right Left   Lids/Lashes Dermatochalasis - upper lid Dermatochalasis - upper lid   Conjunctiva/Sclera mild melanosis mild  melanosis, nasal and temporal pinguecula   Cornea arcus arcus   Anterior Chamber deep and clear deep and clear   Iris Round and dilated Round and dilated   Lens 2+ Nuclear sclerosis, 2+ Cortical cataract 2+ Nuclear sclerosis,  2+ Cortical cataract   Anterior Vitreous mild syneresis, vitreous condensations Syneresis, +cell/pigment, white punctate vitreous condensations settled inferiorly         Fundus Exam       Right Left   Disc Pink and Sharp Pink and Sharp   C/D Ratio 0.4 0.4   Macula Flat, Good foveal reflex, no heme Flat, Good foveal reflex, No heme or edema   Vessels mild attenuation, mild tortuosity mild attenuation, mild tortuosity   Periphery Attached, No heme Attached, VR tufts IN periphery almost to ora; no RT/RD on 360 scleral depression           IMAGING AND PROCEDURES  Imaging and Procedures for 12/21/2022  OCT, Retina - OU - Both Eyes       Right Eye Quality was good. Central Foveal Thickness: 275. Progression has no prior data. Findings include normal foveal contour, no IRF, no SRF, vitreomacular adhesion .   Left Eye Quality was good. Central Foveal Thickness: 282. Progression has no prior data. Findings include normal foveal contour, no IRF, no SRF (Mild vitreous opacities).   Notes *Images captured and stored on drive  Diagnosis / Impression:  NFP, no IRF/SRF OU OS: Mild vitreous opacities  Clinical management:  See below  Abbreviations: NFP - Normal foveal profile. CME - cystoid macular edema. PED - pigment epithelial detachment. IRF - intraretinal fluid. SRF - subretinal fluid. EZ - ellipsoid zone. ERM - epiretinal membrane. ORA - outer retinal atrophy. ORT - outer retinal tubulation. SRHM - subretinal hyper-reflective material. IRHM - intraretinal hyper-reflective material           ASSESSMENT/PLAN:   ICD-10-CM   1. PVD (posterior vitreous detachment), left  H43.812 OCT, Retina - OU - Both Eyes    2. Vitreous hemorrhage of left eye (HCC)   H43.12     3. Essential hypertension  I10     4. Hypertensive retinopathy of both eyes  H35.033     5. Combined forms of age-related cataract of both eyes  H25.813      1. Hemorrhagic PVD OS - pt reports 4 wk history of intermittent photopsias and worsening floaters OS - presented to Dr. Karie Soda on 12.05.24  - Discussed findings and prognosis  - No RT or RD on 360 scleral depressed exam  - Reviewed s/s of RT/RD  - Strict return precautions for any such RT/RD signs/symptoms  - VH precautions reviewed -- minimize activities, keep head elevated, avoid ASA/NSAIDs/blood thinners as able  - f/u in 4 wks -- DFE/OCT  2,3. Hypertensive retinopathy OU - discussed importance of tight BP control - monitor  4. Mixed Cataract OU - The symptoms of cataract, surgical options, and treatments and risks were discussed with patient. - discussed diagnosis and progression - monitor  Ophthalmic Meds Ordered this visit:  No orders of the defined types were placed in this encounter.    Return in about 4 weeks (around 01/18/2023) for f/u PVD OS, DFE, OCT.  There are no Patient Instructions on file for this visit.  Explained the diagnoses, plan, and follow up with the patient and they expressed understanding.  Patient expressed understanding of the importance of proper follow up care.   This document serves as a record of services personally performed by Karie Chimera, MD, PhD. It was created on their behalf by Glee Arvin. Manson Passey, OA an ophthalmic technician. The creation of this record is the provider's dictation and/or activities during the visit.    Electronically signed by: Marchelle Folks  Thomes Lolling, OA 12/21/22 12:33 PM  Karie Chimera, M.D., Ph.D. Diseases & Surgery of the Retina and Vitreous [facility] 12/21/2022  I have reviewed the above documentation for accuracy and completeness, and I agree with the above. Karie Chimera, M.D., Ph.D. 12/21/22 12:33 PM   Abbreviations: M  myopia (nearsighted); A astigmatism; H hyperopia (farsighted); P presbyopia; Mrx spectacle prescription;  CTL contact lenses; OD right eye; OS left eye; OU both eyes  XT exotropia; ET esotropia; PEK punctate epithelial keratitis; PEE punctate epithelial erosions; DES dry eye syndrome; MGD meibomian gland dysfunction; ATs artificial tears; PFAT's preservative free artificial tears; NSC nuclear sclerotic cataract; PSC posterior subcapsular cataract; ERM epi-retinal membrane; PVD posterior vitreous detachment; RD retinal detachment; DM diabetes mellitus; DR diabetic retinopathy; NPDR non-proliferative diabetic retinopathy; PDR proliferative diabetic retinopathy; CSME clinically significant macular edema; DME diabetic macular edema; dbh dot blot hemorrhages; CWS cotton wool spot; POAG primary open angle glaucoma; C/D cup-to-disc ratio; HVF humphrey visual field; GVF goldmann visual field; OCT optical coherence tomography; IOP intraocular pressure; BRVO Branch retinal vein occlusion; CRVO central retinal vein occlusion; CRAO central retinal artery occlusion; BRAO branch retinal artery occlusion; RT retinal tear; SB scleral buckle; PPV pars plana vitrectomy; VH Vitreous hemorrhage; PRP panretinal laser photocoagulation; IVK intravitreal kenalog; VMT vitreomacular traction; MH Macular hole;  NVD neovascularization of the disc; NVE neovascularization elsewhere; AREDS age related eye disease study; ARMD age related macular degeneration; POAG primary open angle glaucoma; EBMD epithelial/anterior basement membrane dystrophy; ACIOL anterior chamber intraocular lens; IOL intraocular lens; PCIOL posterior chamber intraocular lens; Phaco/IOL phacoemulsification with intraocular lens placement; PRK photorefractive keratectomy; LASIK laser assisted in situ keratomileusis; HTN hypertension; DM diabetes mellitus; COPD chronic obstructive pulmonary disease

## 2022-12-21 ENCOUNTER — Ambulatory Visit (INDEPENDENT_AMBULATORY_CARE_PROVIDER_SITE_OTHER): Payer: 59 | Admitting: Ophthalmology

## 2022-12-21 ENCOUNTER — Encounter (INDEPENDENT_AMBULATORY_CARE_PROVIDER_SITE_OTHER): Payer: Self-pay | Admitting: Ophthalmology

## 2022-12-21 DIAGNOSIS — I10 Essential (primary) hypertension: Secondary | ICD-10-CM

## 2022-12-21 DIAGNOSIS — H43812 Vitreous degeneration, left eye: Secondary | ICD-10-CM | POA: Diagnosis not present

## 2022-12-21 DIAGNOSIS — H35033 Hypertensive retinopathy, bilateral: Secondary | ICD-10-CM | POA: Diagnosis not present

## 2022-12-21 DIAGNOSIS — H4312 Vitreous hemorrhage, left eye: Secondary | ICD-10-CM | POA: Diagnosis not present

## 2022-12-21 DIAGNOSIS — H25813 Combined forms of age-related cataract, bilateral: Secondary | ICD-10-CM

## 2022-12-21 DIAGNOSIS — H3581 Retinal edema: Secondary | ICD-10-CM

## 2023-01-18 ENCOUNTER — Encounter (INDEPENDENT_AMBULATORY_CARE_PROVIDER_SITE_OTHER): Payer: Self-pay

## 2023-01-18 ENCOUNTER — Encounter (INDEPENDENT_AMBULATORY_CARE_PROVIDER_SITE_OTHER): Payer: 59 | Admitting: Ophthalmology

## 2023-01-18 DIAGNOSIS — H4312 Vitreous hemorrhage, left eye: Secondary | ICD-10-CM

## 2023-01-18 DIAGNOSIS — H25813 Combined forms of age-related cataract, bilateral: Secondary | ICD-10-CM

## 2023-01-18 DIAGNOSIS — I10 Essential (primary) hypertension: Secondary | ICD-10-CM

## 2023-01-18 DIAGNOSIS — H43812 Vitreous degeneration, left eye: Secondary | ICD-10-CM

## 2023-01-18 DIAGNOSIS — H35033 Hypertensive retinopathy, bilateral: Secondary | ICD-10-CM

## 2023-01-26 ENCOUNTER — Ambulatory Visit: Payer: 59 | Admitting: Vascular Surgery

## 2023-03-24 ENCOUNTER — Encounter (INDEPENDENT_AMBULATORY_CARE_PROVIDER_SITE_OTHER): Payer: Self-pay

## 2023-03-24 ENCOUNTER — Encounter (INDEPENDENT_AMBULATORY_CARE_PROVIDER_SITE_OTHER): Payer: Self-pay | Admitting: Ophthalmology

## 2023-03-24 DIAGNOSIS — H43812 Vitreous degeneration, left eye: Secondary | ICD-10-CM

## 2023-03-24 DIAGNOSIS — H4312 Vitreous hemorrhage, left eye: Secondary | ICD-10-CM

## 2023-03-24 DIAGNOSIS — I10 Essential (primary) hypertension: Secondary | ICD-10-CM

## 2023-03-24 DIAGNOSIS — H35033 Hypertensive retinopathy, bilateral: Secondary | ICD-10-CM

## 2023-03-24 DIAGNOSIS — H25813 Combined forms of age-related cataract, bilateral: Secondary | ICD-10-CM

## 2023-06-21 ENCOUNTER — Other Ambulatory Visit: Payer: Self-pay

## 2023-06-21 ENCOUNTER — Emergency Department (HOSPITAL_COMMUNITY): Payer: MEDICAID

## 2023-06-21 ENCOUNTER — Emergency Department (HOSPITAL_COMMUNITY)
Admission: EM | Admit: 2023-06-21 | Discharge: 2023-06-21 | Disposition: A | Discharge status: Home / Self Care | Payer: MEDICAID | Attending: Emergency Medicine | Admitting: Emergency Medicine

## 2023-06-21 ENCOUNTER — Telehealth: Payer: Self-pay

## 2023-06-21 ENCOUNTER — Encounter (HOSPITAL_COMMUNITY): Payer: Self-pay

## 2023-06-21 DIAGNOSIS — I5042 Chronic combined systolic (congestive) and diastolic (congestive) heart failure: Secondary | ICD-10-CM | POA: Insufficient documentation

## 2023-06-21 DIAGNOSIS — Z5329 Procedure and treatment not carried out because of patient's decision for other reasons: Secondary | ICD-10-CM | POA: Insufficient documentation

## 2023-06-21 DIAGNOSIS — I110 Hypertensive heart disease with heart failure: Secondary | ICD-10-CM | POA: Insufficient documentation

## 2023-06-21 DIAGNOSIS — R7989 Other specified abnormal findings of blood chemistry: Secondary | ICD-10-CM | POA: Insufficient documentation

## 2023-06-21 DIAGNOSIS — I161 Hypertensive emergency: Secondary | ICD-10-CM | POA: Insufficient documentation

## 2023-06-21 DIAGNOSIS — R079 Chest pain, unspecified: Secondary | ICD-10-CM

## 2023-06-21 LAB — BASIC METABOLIC PANEL WITH GFR
Anion gap: 11 (ref 5–15)
BUN: 12 mg/dL (ref 8–23)
CO2: 21 mmol/L — ABNORMAL LOW (ref 22–32)
Calcium: 8.6 mg/dL — ABNORMAL LOW (ref 8.9–10.3)
Chloride: 105 mmol/L (ref 98–111)
Creatinine, Ser: 0.88 mg/dL (ref 0.61–1.24)
GFR, Estimated: >60 mL/min (ref >60)
Glucose, Bld: 104 mg/dL — ABNORMAL HIGH (ref 70–99)
Potassium: 3.9 mmol/L (ref 3.5–5.1)
Sodium: 137 mmol/L (ref 135–145)

## 2023-06-21 LAB — CBC
HCT: 47.2 % (ref 39.0–52.0)
Hemoglobin: 15.6 g/dL (ref 13.0–17.0)
MCH: 30.1 pg (ref 26.0–34.0)
MCHC: 33.1 g/dL (ref 30.0–36.0)
MCV: 91.1 fL (ref 80.0–100.0)
Platelets: 178 10*3/uL (ref 150–400)
RBC: 5.18 MIL/uL (ref 4.22–5.81)
RDW: 12.8 % (ref 11.5–15.5)
WBC: 9.8 10*3/uL (ref 4.0–10.5)
nRBC: 0 % (ref 0.0–0.2)

## 2023-06-21 LAB — TROPONIN I (HIGH SENSITIVITY)
Troponin I (High Sensitivity): 64 ng/L — ABNORMAL HIGH (ref <18)
Troponin I (High Sensitivity): 56 ng/L — ABNORMAL HIGH (ref <18)

## 2023-06-21 MED ORDER — LABETALOL HCL 5 MG/ML IV SOLN
20.0000 mg | Freq: Once | INTRAVENOUS | Status: AC
  Administered 2023-06-21: 20 mg via INTRAVENOUS
  Filled 2023-06-21: qty 4

## 2023-06-21 MED ORDER — IOHEXOL 350 MG/ML SOLN
100.0000 mL | Freq: Once | INTRAVENOUS | Status: AC | PRN
  Administered 2023-06-21: 100 mL via INTRAVENOUS

## 2023-06-21 MED ORDER — VALSARTAN-HYDROCHLOROTHIAZIDE 80-12.5 MG PO TABS: 1.0000 | ORAL_TABLET | Freq: Every morning | ORAL | 0 refills | Status: DC

## 2023-06-21 NOTE — Telephone Encounter (Signed)
 Pt called to tell us  he went to the ED with hypertensive crisis. Pt had CT and requested to see Dr. Fulton Job.  Returned pt's call to discuss. Pt's follow-up appt was rescheduled for 06/29/2023 @ 2:40 pm Pt notified.  Pt advised to go to appt if: Feel sudden, very bad pain in your belly, side, or back. Vomit or feel like you may vomit. Feel light-headed. Faint.  Pt advised to monitor BP.  Pt agreed.

## 2023-06-21 NOTE — Discharge Instructions (Addendum)
 You have hypertensive emergency as we discussed.  I did recommend admission to better control your blood pressure especially in the setting of chest pain and your aneurysm.  However you stated you have a very important funeral to go to an [place]  and are unable to stay.  We discussed how this could lead to heart attack, stroke, or even death.  He understand this concern and still state you are unable to stay.  I have refilled your blood pressure medicine and sent this to your pharmacy.  This is a nonformulary medicine so we do not have this at the hospital so I was unable to get you your first dose in the emergency department.  Pick this up and take your first dose today.  Do not hesitate to return to the emergency department for any worsening or concerning symptoms.  I recommend soon as you attend this funeral that you return to the nearest emergency department for evaluation.  I have also given you a follow-up with cardiologist.  They will call to schedule this appointment.  I also need you to call the vascular surgery office to follow-up regarding the aneurysm.  On CT scan this appears that it has grown in size.  Again please do not hesitate to return to the emergency department.

## 2023-06-21 NOTE — ED Triage Notes (Signed)
 Complaining of chest pain that started yesterday around 1 pm. Arnetta Lank that it is in the center of the chest but it does not go away. Is aching in nature.

## 2023-06-21 NOTE — ED Provider Notes (Addendum)
 [place] EMERGENCY DEPARTMENT AT [facility] Provider Note   CSN: [id number] Arrival date & time: 06/21/23  4098     History  Chief Complaint  Patient presents with   Chest Pain    Benjamin Hines is a 63 y.o. male.  63 year old male with past medical history significant for hypertension, known abdominal aneurysm follows vascular surgery clinic presents today for concern of chest pain that started last night he had a numbness sensation all over his body that lasted for about an hour.  Chest pain and the numbness sensation have resolved.  Currently he is asymptomatic.  His blood pressure is elevated.  He has been out of his medicines now for about 2 to 3 weeks.  He states due to insurance issues he has not been able to establish a new PCP.  The history is provided by the patient. No language interpreter was used.       Home Medications Prior to Admission medications   Medication Sig Start Date End Date Taking? Authorizing Provider  amLODipine  (NORVASC ) 10 MG tablet Take 1 tablet (10 mg total) by mouth daily. 09/06/20   Burchette, Marijean Shouts, MD  atorvastatin  (LIPITOR) 40 MG tablet Take 1 tablet (40 mg total) by mouth daily. 09/06/20   Burchette, Marijean Shouts, MD  cetirizine  (ZYRTEC ) 10 MG tablet Take 10 mg by mouth at bedtime as needed. 04/25/20   [provider]  fluticasone  (FLONASE ) 50 MCG/ACT nasal spray Place 1 spray into both nostrils daily as needed. 03/06/20   [provider]  tadalafil  (CIALIS ) 5 MG tablet Take 1 tablet (5 mg total) by mouth daily as needed for erectile dysfunction. 03/13/21   Dustin Gimenez, MD  tamsulosin  (FLOMAX ) 0.4 MG CAPS capsule Take 0.4 mg by mouth daily. 04/25/20   [provider]  thiamine  (VITAMIN B-1) 100 MG tablet Take 100 mg by mouth daily.    [provider]  valsartan -hydrochlorothiazide  (DIOVAN -HCT) 80-12.5 MG tablet Take 1 tablet by mouth every morning. 06/21/23   Lucina Sabal, PA-C      Allergies     Patient has no known allergies.    Review of Systems   Review of Systems  Constitutional:  Negative for chills and fever.  Respiratory:  Negative for shortness of breath.   Cardiovascular:  Positive for chest pain (now resolved). Negative for palpitations and leg swelling.  Gastrointestinal:  Negative for nausea and vomiting.  Neurological:  Negative for light-headedness.  All other systems reviewed and are negative.   Physical Exam Updated Vital Signs BP (!) 199/147   Pulse 81   Temp 97.7 F (36.5 C) (Oral)   Resp 18   Ht 5' 9 (1.753 m)   Wt 82.6 kg   SpO2 99%   BMI 26.88 kg/m  Physical Exam Vitals and nursing note reviewed.  Constitutional:      General: He is not in acute distress.    Appearance: Normal appearance. He is not ill-appearing.  HENT:     Head: Normocephalic and atraumatic.     Nose: Nose normal.  Eyes:     Conjunctiva/sclera: Conjunctivae normal.  Cardiovascular:     Rate and Rhythm: Normal rate and regular rhythm.  Pulmonary:     Effort: Pulmonary effort is normal. No respiratory distress.     Breath sounds: Normal breath sounds. No wheezing or rales.  Musculoskeletal:        General: No deformity. Normal range of motion.     Cervical back: Normal range of  motion.  Skin:    Findings: No rash.  Neurological:     Mental Status: He is alert.     ED Results / Procedures / Treatments   Labs (all labs ordered are listed, but only abnormal results are displayed) Labs Reviewed  BASIC METABOLIC PANEL WITH GFR - Abnormal; Notable for the following components:      Result Value   CO2 21 (*)    Glucose, Bld 104 (*)    Calcium  8.6 (*)    All other components within normal limits  TROPONIN I (HIGH SENSITIVITY) - Abnormal; Notable for the following components:   Troponin I (High Sensitivity) 64 (*)    All other components within normal limits  TROPONIN I (HIGH SENSITIVITY) - Abnormal; Notable for the following components:   Troponin I (High  Sensitivity) 56 (*)    All other components within normal limits  CBC    EKG EKG Interpretation Date/Time:  Monday June 21 2023 03:33:13 EDT Ventricular Rate:  96 PR Interval:  138 QRS Duration:  94 QT Interval:  412 QTC Calculation: 520 R Axis:   -23  Text Interpretation: Normal sinus rhythm Non-specific ST-t changes Confirmed by Guadalupe Lee ([id number]) on 06/21/2023 7:02:36 AM  Radiology CT Angio Chest/Abd/Pel for Dissection W and/or Wo Contrast Result Date: 06/21/2023 CLINICAL DATA:  A central chest pain. Acute aortic syndrome is suspected. EXAM: CT ANGIOGRAPHY CHEST, ABDOMEN AND PELVIS TECHNIQUE: Non-contrast CT of the chest was initially obtained. Multidetector CT imaging through the chest, abdomen and pelvis was performed using the standard protocol during bolus administration of intravenous contrast. Multiplanar reconstructed images and MIPs were obtained and reviewed to evaluate the vascular anatomy. RADIATION DOSE REDUCTION: This exam was performed according to the departmental dose-optimization program which includes automated exposure control, adjustment of the mA and/or kV according to patient size and/or use of iterative reconstruction technique. CONTRAST:  OMNIPAQUE  IOHEXOL  350 MG/ML SOLN COMPARISON:  Prior CT scan of the chest 04/10/2021; prior CT scan of the abdomen and pelvis 01/21/2018 FINDINGS: CTA CHEST FINDINGS Cardiovascular: The aortic root is normal in caliber at 2.9 cm measured at the sinuses of Valsalva. The ascending thoracic aorta is diffusely tortuous but remains normal in caliber. Conventional 3 vessel arch anatomy. Elongation of the aortic isthmus and proximal descending thoracic aorta results in a type 3 arch. The descending thoracic aorta is mildly aneurysmal with a maximal diameter of 4 cm at the isthmus and 4.5 x 4.4 cm in the distal descending segment at the aortic hiatus. Excellent opacification of the pulmonary arteries. No evidence of pulmonary embolus.  Cardiomegaly. Concentric hypertrophy of the left ventricular myocardium suggests systemic arterial hypertension. No pericardial effusion. Mediastinum/Nodes: Unremarkable CT appearance of the thyroid  gland. No suspicious mediastinal or hilar adenopathy. No soft tissue mediastinal mass. The thoracic esophagus is unremarkable. Lungs/Pleura: Chronic elevation of the left hemidiaphragm. Scattered areas of chronic atelectasis. No focal airspace infiltrate, pulmonary edema, pleural effusion or pneumothorax. No suspicious pulmonary mass or nodule. Musculoskeletal: No acute fracture or aggressive appearing lytic or blastic osseous lesion. Review of the MIP images confirms the above findings. CTA ABDOMEN AND PELVIS FINDINGS VASCULAR Aorta: Suboptimal filling of the aorta due to early timing of the scan. No definite dissection. Aneurysmal dilation of the visceral aorta measures 4.5 x 4.2 cm which is enlarged compared to a maximal diameter of 3.9 cm measured in 2020. Mild aneurysmal dilation of the distal infrarenal segment to a maximal diameter of 2.9 cm. Celiac: Patent without evidence of aneurysm,  dissection, vasculitis or significant stenosis. SMA: Patent without evidence of aneurysm, dissection, vasculitis or significant stenosis. Renals: Both renal arteries are patent without evidence of aneurysm, dissection, vasculitis, fibromuscular dysplasia or significant stenosis. IMA: Patent without evidence of aneurysm, dissection, vasculitis or significant stenosis. Inflow: Mild aneurysmal dilation of the right common iliac artery to a maximal diameter of 1.8 cm. Extremely limited evaluation of the inflow vessels. Veins: No focal venous abnormality. Review of the MIP images confirms the above findings. NON-VASCULAR Hepatobiliary: No focal liver abnormality is seen. No gallstones, gallbladder wall thickening, or biliary dilatation. Pancreas: Unremarkable. No pancreatic ductal dilatation or surrounding inflammatory changes. Spleen:  Normal in size without focal abnormality. Adrenals/Urinary Tract: Normal adrenal glands. No hydronephrosis, nephrolithiasis or enhancing renal mass. Simple cyst exophytic from the lower pole of the right kidney. No imaging follow-up is recommended. Ureters and bladder are unremarkable. Stomach/Bowel: No focal bowel obstruction or evidence of bowel wall thickening. Lymphatic: No suspicious lymphadenopathy. Reproductive: Brachytherapy seeds throughout the prostate gland. Other: No abdominal wall hernia or abnormality. No abdominopelvic ascites. Musculoskeletal: No acute fracture or aggressive appearing lytic or blastic osseous lesion. Focal L5-S1 degenerative disc disease. Review of the MIP images confirms the above findings. IMPRESSION: 1. Negative for thoracoabdominal aortic dissection or other acute vascular abnormality within the limitations of this study (see pound 3 below). 2. Slight interval enlargement of descending thoracic and visceral abdominal aortic aneurysm with a maximal diameter of 4.5 cm today compared to 3.9 cm measured in 2020. 3. Very limited evaluation of the arterial structures of the abdomen and pelvis due to early (pulmonary arterial) timing of the scan. No convincing evidence of acute abnormality. 4. Mild aneurysmal dilation of the right common iliac artery with a maximal diameter of 1.8 cm. 5. Scattered atherosclerotic plaque. Aortic Atherosclerosis (ICD10-I70.0). 6. Cardiomegaly with concentric hypertrophy of the left ventricular myocardium. This suggests underlying systemic arterial hypertension. 7. Focal L5-S1 degenerative disc disease. 8. Chronic elevation of the left hemidiaphragm. 9. No acute abnormality within the chest, abdomen pelvis. Electronically Signed   By: Fernando Hoyer M.D.   On: 06/21/2023 08:43   DG Chest 2 View Result Date: 06/21/2023 CLINICAL DATA:  Chest pain for several hours EXAM: CHEST - 2 VIEW COMPARISON:  10/31/2020 FINDINGS: Cardiac shadow is stable. Tortuous  thoracic aorta is again seen. Elevation of the left hemidiaphragm is noted. Increased central vascular congestion is noted without significant edema. No effusion is noted. IMPRESSION: Mild congestive failure without significant edema. Electronically Signed   By: Violeta Grey M.D.   On: 06/21/2023 03:59    Procedures .Critical Care  Performed by: Lucina Sabal, PA-C Authorized by: Lucina Sabal, PA-C   Critical care provider statement:    Critical care time (minutes):  30   Critical care was necessary to treat or prevent imminent or life-threatening deterioration of the following conditions: Hypertensive emergency.   Critical care was time spent personally by me on the following activities:  Development of treatment plan with patient or surrogate, discussions with consultants, evaluation of patient's response to treatment, examination of patient, ordering and review of laboratory studies, ordering and review of radiographic studies, ordering and performing treatments and interventions, pulse oximetry, re-evaluation of patient's condition and review of old charts     Medications Ordered in ED Medications  labetalol  (NORMODYNE ) injection 20 mg (20 mg Intravenous Given 06/21/23 0743)  iohexol  (OMNIPAQUE ) 350 MG/ML injection 100 mL (100 mLs Intravenous Contrast Given 06/21/23 0825)    ED Course/ Medical Decision Making/ A&P  Medical Decision Making Amount and/or Complexity of Data Reviewed Labs: ordered. Radiology: ordered.  Risk Prescription drug management.   Medical Decision Making / ED Course   This patient presents to the ED for concern of chest pain, this involves an extensive number of treatment options, and is a complaint that carries with it a high risk of complications and morbidity.  The differential diagnosis includes hypertensive emergency, ACS, pneumonia, PE, dissection PE less likely along with pneumonia due to physical exam and  history.  MDM: 63 year old male presents today for concern of chest pain. Blood pressure noted to be significantly elevated in the emergency department. Mild elevation in troponin.  Troponin is flat.  Not indicative of ACS. Likely hypertensive emergency. Labetalol  was given with some improvement however still remains significantly elevated. Remains chest pain-free. Due to concern for hypertensive emergency we did discuss admission. Dissection study was negative for any acute concerns.  It did show interval increase in size from previous CT scan.  Stressed importance of following up with vascular surgery. Patient states he has a funeral to attend to tomorrow in [place]  and is unable to stay for admission.  With the discussed admission for better control and then he can leave early in the morning potentially however he states he has to leave at 3 AM and is not willing to stay. Extensive discussions had regarding poor outcome with persistent high blood pressure and his history and the elevated troponin.  He voices understanding and still would like to be discharged.  Patient will sign out AGAINST MEDICAL ADVICE. Home BP medicine refilled. Cardiology referral placed.  Discussed follow-up with vascular surgery. Discharged in stable condition. Return precautions discussed.   Additional history obtained: -Additional history obtained from chart review -External records from outside source obtained and reviewed including: Chart review including previous notes, labs, imaging, consultation notes   Lab Tests: -I ordered, reviewed, and interpreted labs.   The pertinent results include:   Labs Reviewed  BASIC METABOLIC PANEL WITH GFR - Abnormal; Notable for the following components:      Result Value   CO2 21 (*)    Glucose, Bld 104 (*)    Calcium  8.6 (*)    All other components within normal limits  TROPONIN I (HIGH SENSITIVITY) - Abnormal; Notable for the following components:   Troponin I  (High Sensitivity) 64 (*)    All other components within normal limits  TROPONIN I (HIGH SENSITIVITY) - Abnormal; Notable for the following components:   Troponin I (High Sensitivity) 56 (*)    All other components within normal limits  CBC      EKG  EKG Interpretation Date/Time:  Monday June 21 2023 03:33:13 EDT Ventricular Rate:  96 PR Interval:  138 QRS Duration:  94 QT Interval:  412 QTC Calculation: 520 R Axis:   -23  Text Interpretation: Normal sinus rhythm Non-specific ST-t changes Confirmed by Guadalupe Lee ([id number]) on 06/21/2023 7:02:36 AM         Imaging Studies ordered: I ordered imaging studies including chest EXTR I independently visualized and interpreted imaging. I agree with the radiologist interpretation   Medicines ordered and prescription drug management: Meds ordered this encounter  Medications   labetalol  (NORMODYNE ) injection 20 mg   iohexol  (OMNIPAQUE ) 350 MG/ML injection 100 mL   valsartan -hydrochlorothiazide  (DIOVAN -HCT) 80-12.5 MG tablet    Sig: Take 1 tablet by mouth every morning.    Dispense:  30 tablet    Refill:  0    Supervising Provider:  MILLER, BRIAN [[id number]]    -I have reviewed the patients home medicines and have made adjustments as needed  Reevaluation: After the interventions noted above, I reevaluated the patient and found that they have :stayed the same  Co morbidities that complicate the patient evaluation  Past Medical History:  Diagnosis Date   AAA (abdominal aortic aneurysm) without rupture (HCC) 12/27/2017   followed by vascular-- dr c. Fulton Job,  last duplex in epic 07-23-2020 4.6cm   Arthritis    Chronic combined systolic and diastolic CHF (congestive heart failure) (HCC) 12/2017   Hyperlipidemia    Hypertension    followed by pcp   Nocturia    OSA (obstructive sleep apnea)    pulmonology --- dr Matilde Son----  (11-21-2020  pt stated has not used cpap in 6 months , intolerate)      Dispostion: Patient left AGAINST  MEDICAL ADVICE.   Final Clinical Impression(s) / ED Diagnoses Final diagnoses:  Hypertensive emergency  Nonspecific chest pain    Rx / DC Orders ED Discharge Orders          Ordered    valsartan -hydrochlorothiazide  (DIOVAN -HCT) 80-12.5 MG tablet  Every morning        06/21/23 0932    Ambulatory referral to Cardiology       Comments: If you have not heard from the Cardiology office within the next 72 hours please call [phone].   06/21/23 0936              Lucina Sabal, PA-C 06/21/23 0948    Lucina Sabal, PA-C 06/21/23 0981    Guadalupe Lee, MD 06/29/23 1008

## 2023-06-29 ENCOUNTER — Ambulatory Visit: Payer: Self-pay | Attending: Vascular Surgery | Admitting: Vascular Surgery

## 2023-06-29 ENCOUNTER — Encounter: Payer: Self-pay | Admitting: Vascular Surgery

## 2023-06-29 VITALS — BP 180/128 | HR 78 | Temp 98.0°F | Resp 20 | Ht 69.0 in | Wt 175.8 lb

## 2023-06-29 DIAGNOSIS — I7143 Infrarenal abdominal aortic aneurysm, without rupture: Secondary | ICD-10-CM

## 2023-06-29 NOTE — Progress Notes (Signed)
 Patient name: Benjamin Hines MRN: [id number] DOB: 12-Dec-1960 Sex: male  REASON FOR VISIT: F/U after CTA for AAA  HPI: Benjamin Hines is a 63 y.o. male with hx HTN, HLD, CHF, tobacco abuse (cigar) who presents for follow-up after CTA for further evaluation of his AAA.  States he gets some intermittent chest pain over the last several months and was concerned.  Denies any cardiac history, no prior cardiologist.      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  At the time he presented with abdominal pain and a CT was obtained in the ED that showed some stranding around the aorta and he was seen in consultation on 12/27/17.  Ultimately underwent a negative infectious work-up including negative blood cultures.  He got a repeat CT scan at 1 month follow-up after hospital discharge in 01/2018 that showed no further stranding.    Past Medical History:  Diagnosis Date   AAA (abdominal aortic aneurysm) without rupture (HCC) 12/27/2017   followed by vascular-- dr c. Fulton Job,  last duplex in epic 07-23-2020 4.6cm   Arthritis    Chronic combined systolic and diastolic CHF (congestive heart failure) (HCC) 12/2017   Hyperlipidemia    Hypertension    followed by pcp   Nocturia    OSA (obstructive sleep apnea)    pulmonology --- dr Matilde Son----  (11-21-2020  pt stated has not used cpap in 6 months , intolerate)    Past Surgical History:  Procedure Laterality Date   COLONOSCOPY  07/26/2020   by dr stark   CYSTOSCOPY  11/26/2020   Procedure: Ardith Bedford;  Surgeon: Roxane Copp, MD;  Location: [facility];  Service: Urology;;   FOOT SURGERY Left 2002   fracture  (pt stated no hardware)   RADIOACTIVE SEED IMPLANT N/A 11/26/2020   Procedure: RADIOACTIVE SEED IMPLANT/BRACHYTHERAPY IMPLANT;  Surgeon: Roxane Copp, MD;  Location: [facility];  Service: Urology;  Laterality: N/A;   SPACE OAR INSTILLATION N/A 11/26/2020    Procedure: SPACE OAR INSTILLATION;  Surgeon: Roxane Copp, MD;  Location: [facility];  Service: Urology;  Laterality: N/A;    Family History  Problem Relation Age of Onset   Dementia Mother    Hypertension Mother    CAD Father    Colon cancer Neg Hx    Colon polyps Neg Hx    Esophageal cancer Neg Hx    Rectal cancer Neg Hx    Stomach cancer Neg Hx     SOCIAL HISTORY: Social History   Tobacco Use   Smoking status: Former    Current packs/day: 1.00    Average packs/day: 1 pack/day for 30.0 years (30.0 ttl pk-yrs)    Types: Cigarettes, Cigars    Passive exposure: Past   Smokeless tobacco: Never   Tobacco comments:    11-21-2020  pt stated stopped smoking cigarettes 2012 for 20 yrs ,  since then occasional cigar  Substance Use Topics   Alcohol use: Not Currently    Alcohol/week: 21.0 standard drinks of alcohol    Types: 21 Cans of beer per week    Comment: 11-21-2020 pt stated 3 beers daily (12 oz)    No Known Allergies  Current Outpatient Medications  Medication Sig Dispense Refill   amLODipine  (NORVASC ) 10 MG tablet Take 1 tablet (10 mg total) by mouth daily. 30 tablet 0   atorvastatin  (LIPITOR) 40 MG tablet Take 1 tablet (40 mg  total) by mouth daily. 30 tablet 0   cetirizine  (ZYRTEC ) 10 MG tablet Take 10 mg by mouth at bedtime as needed.     fluticasone  (FLONASE ) 50 MCG/ACT nasal spray Place 1 spray into both nostrils daily as needed.     tadalafil  (CIALIS ) 5 MG tablet Take 1 tablet (5 mg total) by mouth daily as needed for erectile dysfunction. 30 tablet 11   tamsulosin  (FLOMAX ) 0.4 MG CAPS capsule Take 0.4 mg by mouth daily.     thiamine  (VITAMIN B-1) 100 MG tablet Take 100 mg by mouth daily.     valsartan -hydrochlorothiazide  (DIOVAN -HCT) 80-12.5 MG tablet Take 1 tablet by mouth every morning. 30 tablet 0   No current facility-administered medications for this visit.    REVIEW OF SYSTEMS:  [X]  denotes positive finding, [ ]  denotes negative  finding Cardiac  Comments:  Chest pain or chest pressure:    Shortness of breath upon exertion:    Short of breath when lying flat:    Irregular heart rhythm:        Vascular    Pain in calf, thigh, or hip brought on by ambulation:    Pain in feet at night that wakes you up from your sleep:     Blood clot in your veins:    Leg swelling:         Pulmonary    Oxygen at home:    Productive cough:     Wheezing:         Neurologic    Sudden weakness in arms or legs:     Sudden numbness in arms or legs:     Sudden onset of difficulty speaking or slurred speech:    Temporary loss of vision in one eye:     Problems with dizziness:         Gastrointestinal    Blood in stool:     Vomited blood:         Genitourinary    Burning when urinating:     Blood in urine:        Psychiatric    Major depression:         Hematologic    Bleeding problems:    Problems with blood clotting too easily:        Skin    Rashes or ulcers:        Constitutional    Fever or chills:      PHYSICAL EXAM: Vitals:   06/29/23 1423  BP: (!) 180/128  Pulse: 78  Resp: 20  Temp: 98 F (36.7 C)  TempSrc: Temporal  SpO2: 96%  Weight: 175 lb 12.8 oz (79.7 kg)  Height: 5' 9 (1.753 m)    GENERAL: The patient is a well-nourished male, in no acute distress. The vital signs are documented above. CARDIAC: There is a regular rate and rhythm.  VASCULAR:  2+ femoral pulse palpable bilateral groins 2+ dorsalis pedis pulses palpable bilateral lower extremities PULMONARY: No respiratory distress. ABDOMEN: Soft and non-tender.  No pain with deep palpation MUSCULOSKELETAL: There are no major deformities or cyanosis. NEUROLOGIC: No focal weakness or paresthesias are detected.   DATA:   CTA reviewed 06/21/2023 with 4.5 cm descending thoracic/visceral abdominal aneurysm  Assessment/Plan:  63 year old male presents for follow-up after CTA for further evaluation of his known aneurysm.  Previously  underwent workup for aortitis with stranding around his aorta in 2019.  On recent CTA from 06/21/2023 the maximal diameter of his distal thoracic and visceral aorta is  4.5 cm.  I discussed no indication for repair.  Can continue surveillance.  I discussed we repair these greater than 5.5 cm.  I will see him in 6 months with AAA duplex.  He is concerned about some intermittent exertional chest pain and discussed I will refer him to cardiology here at [facility] for further work-up and evaluation.  Young Hensen, MD [facility] Office: [phone]

## 2023-07-01 ENCOUNTER — Other Ambulatory Visit: Payer: Self-pay | Admitting: *Deleted

## 2023-07-01 DIAGNOSIS — I7143 Infrarenal abdominal aortic aneurysm, without rupture: Secondary | ICD-10-CM

## 2023-07-20 ENCOUNTER — Ambulatory Visit: Admitting: Vascular Surgery

## 2023-07-21 NOTE — Progress Notes (Signed)
 Cardiology Clinic Note   Patient Name: Benjamin Hines Date of Encounter: 07/23/2023  Primary Care Provider:  Pcp, No Primary Cardiologist:  None  Patient Profile    Benjamin Hines 63 year old male presents to clinic today for follow-up evaluation of his hypertension and chest pain.  Past Medical History    Past Medical History:  Diagnosis Date   AAA (abdominal aortic aneurysm) without rupture (HCC) 12/27/2017   followed by vascular-- dr c. gretta,  last duplex in epic 07-23-2020 4.6cm   Arthritis    Chronic combined systolic and diastolic CHF (congestive heart failure) (HCC) 12/2017   Hyperlipidemia    Hypertension    followed by pcp   Nocturia    OSA (obstructive sleep apnea)    pulmonology --- dr shellia----  (11-21-2020  pt stated has not used cpap in 6 months , intolerate)   Past Surgical History:  Procedure Laterality Date   COLONOSCOPY  07/26/2020   by dr stark   CYSTOSCOPY  11/26/2020   Procedure: PHYLLIS SIDE;  Surgeon: Elisabeth Valli BIRCH, MD;  Location: [facility];  Service: Urology;;   FOOT SURGERY Left 2002   fracture  (pt stated no hardware)   RADIOACTIVE SEED IMPLANT N/A 11/26/2020   Procedure: RADIOACTIVE SEED IMPLANT/BRACHYTHERAPY IMPLANT;  Surgeon: Elisabeth Valli BIRCH, MD;  Location: [facility];  Service: Urology;  Laterality: N/A;   SPACE OAR INSTILLATION N/A 11/26/2020   Procedure: SPACE OAR INSTILLATION;  Surgeon: Elisabeth Valli BIRCH, MD;  Location: [facility];  Service: Urology;  Laterality: N/A;    Allergies  No Known Allergies  History of Present Illness    [name] is a PMH of hypertensive emergency, chronic combined systolic and diastolic CHF, AAA, OSA, GERD, HLD, and prostate CA.  He is followed by vascular surgery for his AAA.  Echocardiogram 02/19/2017 showed an LVEF of 40-45%, normal LV wall thickness, G2 DD and no significant valvular abnormalities.  Nuclear stress test 02/23/2017  showed EF of 35%, small defect of mild severity at the basal inferior and mid inferior location.  Defect is nonreversible and consistent with diaphragmatic attenuation artifact and extracardiac radiotracer.  No ischemia was noted.  He was seen and evaluated by Dr. Pietro 02/15/17.  During that time he was referred from his PCP for evaluation of his blood pressure.  He has been seen by his PCP and his blood pressure medications were resumed.  During time of evaluation he was feeling better.  He noted mild dyspnea on exertion.  He denied orthopnea PND and lower extremity swelling.  He denied chest pain and syncope.  His lab work was reviewed.  He was lost to follow-up.  He presented to the emergency department on 06/21/2023.  He complained of chest pain that started the night before.  He also had a numbness sensation throughout his body.  This lasted for about an hour.  At the time of exam his chest pain and numbness sensation have resolved.  He was asymptomatic.  His blood pressure was noted to be elevated.  He reported that he had been out of his medication for about 2-3 weeks.  He reported that due to insurance issues he was not able to establish care with new PCP.  His lab work was unremarkable.  His cardiac troponins were noted to be 64 and 56.  His chest x-ray showed mild congestive failure without significant edema.  His CT angio chest showed no abdominal aortic dissection or  acute vascular abnormalities.  He was noted to have slight interval enlargement of the ascending thoracic and visceral abdominal aortic aneurysm measuring 45 mm.  This was compared to his 2020 scan which showed a 39 mm aneurysm.  He was noted to have cardiomegaly with concentric hypertrophy of the left ventricular myocardium.  He was referred to cardiology for further evaluation and management of his hypertension.  He states he has had elevated blood pressure for about 10 years.  He reports that he has been on various medications, some  of which made him feel bad.  We reviewed his recent emergency department visit.  He expressed understanding.  He reports that he has run out of his amlodipine .  He did take his Diovan  this morning.  Initially his blood pressure was noted to be 202/145 and on recheck is 184/138.  We reviewed secondary causes of hypertension.  He reports that he was diagnosed with sleep apnea but was unable to tolerate CPAP.  I have asked him to consult with his dentist for oral appliance.  I have asked him to avoid supine sleeping and elevate the head of his bed.  I will give him the salty 6 diet sheet, stop his Diovan , refill his amlodipine , start HCTZ 25 mg and start irbesartan  150 mg daily.  I will give him a blood pressure log.  He reports that he has a blood pressure cuff at home to monitor his blood pressure.  We reviewed appropriate ways to monitor this.  He expressed understanding.  I will also give him the mindfulness stress reduction sheet and refer him to the advanced hypertension clinic.  We will order be met in 1-2 weeks.  I will also give him information for a primary care provider.  Today he denies chest pain, shortness of breath, lower extremity edema, fatigue, palpitations, melena, hematuria, hemoptysis, diaphoresis, weakness, presyncope, syncope, orthopnea, and PND.  He denies headache but does note he occasionally has floaters that are visible in his vision field.     Home Medications    Prior to Admission medications   Medication Sig Start Date End Date Taking? Authorizing Provider  amLODipine  (NORVASC ) 10 MG tablet Take 1 tablet (10 mg total) by mouth daily. 09/06/20   Burchette, Wolm ORN, MD  atorvastatin  (LIPITOR) 40 MG tablet Take 1 tablet (40 mg total) by mouth daily. 09/06/20   Burchette, Wolm ORN, MD  cetirizine  (ZYRTEC ) 10 MG tablet Take 10 mg by mouth at bedtime as needed. 04/25/20   [provider]  fluticasone  (FLONASE ) 50 MCG/ACT nasal spray Place 1 spray into both nostrils daily as  needed. 03/06/20   [provider]  tadalafil  (CIALIS ) 5 MG tablet Take 1 tablet (5 mg total) by mouth daily as needed for erectile dysfunction. 03/13/21   Penne Knee, MD  tamsulosin  (FLOMAX ) 0.4 MG CAPS capsule Take 0.4 mg by mouth daily. 04/25/20   [provider]  thiamine  (VITAMIN B-1) 100 MG tablet Take 100 mg by mouth daily.    [provider]  valsartan -hydrochlorothiazide  (DIOVAN -HCT) 80-12.5 MG tablet Take 1 tablet by mouth every morning. 06/21/23   Hildegard Loge, PA-C    Family History    Family History  Problem Relation Age of Onset   Dementia Mother    Hypertension Mother    CAD Father    Colon cancer Neg Hx    Colon polyps Neg Hx    Esophageal cancer Neg Hx    Rectal cancer Neg Hx    Stomach cancer  Neg Hx    He indicated that his mother is alive. He indicated that his father is alive. He indicated that the status of his neg hx is unknown.  Social History    Social History   Socioeconomic History   Marital status: Married    Spouse name: Not on file   Number of children: Not on file   Years of education: Not on file   Highest education level: Not on file  Occupational History   Not on file  Tobacco Use   Smoking status: Former    Current packs/day: 1.00    Average packs/day: 1 pack/day for 30.0 years (30.0 ttl pk-yrs)    Types: Cigarettes, Cigars    Passive exposure: Past   Smokeless tobacco: Never   Tobacco comments:    11-21-2020  pt stated stopped smoking cigarettes 2012 for 20 yrs ,  since then occasional cigar  Vaping Use   Vaping status: Never Used  Substance and Sexual Activity   Alcohol use: Not Currently    Alcohol/week: 21.0 standard drinks of alcohol    Types: 21 Cans of beer per week    Comment: 11-21-2020 pt stated 3 beers daily (12 oz)   Drug use: Never   Sexual activity: Yes  Other Topics Concern   Not on file  Social History Narrative   Not on file   Social Drivers of Health   Financial Resource Strain: Low  Risk  (02/14/2021)   Overall Financial Resource Strain (CARDIA)    Difficulty of Paying Living Expenses: Not very hard  Food Insecurity: No Food Insecurity (02/14/2021)   Hunger Vital Sign    Worried About Running Out of Food in the Last Year: Never true    Ran Out of Food in the Last Year: Never true  Transportation Needs: No Transportation Needs (02/14/2021)   PRAPARE - Administrator, Civil Service (Medical): No    Lack of Transportation (Non-Medical): No  Physical Activity: Inactive (02/14/2021)   Exercise Vital Sign    Days of Exercise per Week: 0 days    Minutes of Exercise per Session: 0 min  Stress: No Stress Concern Present (02/14/2021)   [employer] of Occupational Health - Occupational Stress Questionnaire    Feeling of Stress : Not at all  Social Connections: Moderately Isolated (02/14/2021)   Social Connection and Isolation Panel    Frequency of Communication with Friends and Family: More than three times a week    Frequency of Social Gatherings with Friends and Family: Three times a week    Attends Religious Services: Never    Active Member of Clubs or Organizations: No    Attends Banker Meetings: Never    Marital Status: Married  Catering manager Violence: Not At Risk (02/14/2021)   Humiliation, Afraid, Rape, and Kick questionnaire    Fear of Current or Ex-Partner: No    Emotionally Abused: No    Physically Abused: No    Sexually Abused: No     Review of Systems    General:  No chills, fever, night sweats or weight changes.  Cardiovascular:  No chest pain, dyspnea on exertion, edema, orthopnea, palpitations, paroxysmal nocturnal dyspnea. Dermatological: No rash, lesions/masses Respiratory: No cough, dyspnea Urologic: No hematuria, dysuria Abdominal:   No nausea, vomiting, diarrhea, bright red blood per rectum, melena, or hematemesis Neurologic:  No visual changes, wkns, changes in mental status. All other systems reviewed and are otherwise  negative except as noted above.  Physical  Exam    VS:  BP (!) 184/138   Pulse 77   Ht 5' 9 (1.753 m)   Wt 177 lb (80.3 kg)   BMI 26.14 kg/m  , BMI Body mass index is 26.14 kg/m. GEN: Well nourished, well developed, in no acute distress. HEENT: normal. Neck: Supple, no JVD, carotid bruits, or masses. Cardiac: RRR, 2/6 systolic murmur heard along right sternal border, rubs, or gallops. No clubbing, cyanosis, edema.  Radials/DP/PT 2+ and equal bilaterally.  Respiratory:  Respirations regular and unlabored, clear to auscultation bilaterally. GI: Soft, nontender, nondistended, BS + x 4. MS: no deformity or atrophy. Skin: warm and dry, no rash. Neuro:  Strength and sensation are intact. Psych: Normal affect.  Accessory Clinical Findings    Recent Labs: 06/21/2023: BUN 12; Creatinine, Ser 0.88; Hemoglobin 15.6; Platelets 178; Potassium 3.9; Sodium 137   Recent Lipid Panel    Component Value Date/Time   CHOL 210 (H) 06/25/2020 0000   TRIG 596 (H) 06/25/2020 0000   HDL 43 06/25/2020 0000   CHOLHDL 4.9 06/25/2020 0000   VLDL 41.4 (H) 01/27/2019 0909   LDLCALC  06/25/2020 0000     Comment:     . LDL cholesterol not calculated. Triglyceride levels greater than 400 mg/dL invalidate calculated LDL results. . Reference range: <100 . Desirable range <100 mg/dL for primary prevention;   <70 mg/dL for patients with CHD or diabetic patients  with > or = 2 CHD risk factors. SABRA LDL-C is now calculated using the Martin-Hopkins  calculation, which is a validated novel method providing  better accuracy than the Friedewald equation in the  estimation of LDL-C.  Gladis APPLETHWAITE et al. SANDREA. 7986;689(80): 2061-2068  (http://education.[facility].com/faq/FAQ164)    LDLDIRECT 108.0 01/27/2019 0909    HYPERTENSION CONTROL Vitals:   07/23/23 0842 07/23/23 0923  BP: (!) 202/145 (!) 184/138    The patient's blood pressure is elevated above target today.  In order to address the patient's  elevated BP: Blood pressure will be monitored at home to determine if medication changes need to be made.; A new medication was prescribed today.; A referral to the Advanced Hypertension Clinic will be placed.       ECG personally reviewed by me today-none today.     Echocardiogram 02/19/2017  LV EF: 40% -   45%   -------------------------------------------------------------------  Indications:     (R06.09). GLS. 16.3   -------------------------------------------------------------------  History:  PMH:  Acquired from the patient and from the patient&'s  chart. Exertional dyspnea. No prior cardiac history.  Risk  factors:  Former tobacco use. Hypertension. Dyslipidemia.   -------------------------------------------------------------------  Study Conclusions   - Left ventricle: The cavity size was normal. Wall thickness was    normal. Systolic function was mildly to moderately reduced. The    estimated ejection fraction was in the range of 40% to 45%.    Although no diagnostic regional wall motion abnormality was    identified, this possibility cannot be completely excluded on the    basis of this study. Features are consistent with a pseudonormal    left ventricular filling pattern, with concomitant abnormal    relaxation and increased filling pressure (grade 2 diastolic    dysfunction).  - Left atrium: The atrium was mildly dilated.   -------------------------------------------------------------------  Study data:  No prior study was available for comparison.  Study  status:  Routine.  Procedure:  The patient reported no pain pre or  post test. Transthoracic echocardiography for diagnosis, for left  ventricular  function evaluation, for right ventricular function  evaluation, and for assessment of valvular function. Image quality  was adequate.  Study completion:  There were no complications.      Transthoracic echocardiography.  M-mode, complete 2D, 3D,  spectral Doppler, and  color Doppler.  Birthdate:  Patient  birthdate: [birth year].  Age:  Patient is 63 yr old.  Sex:  Gender:  male.    BMI: 27.6 kg/m^2.  Blood pressure:     146/100  Patient  status:  Outpatient.  Study date:  Study date: 02/19/2017. Study  time: 09:42 AM.  Location:  [facility]   -------------------------------------------------------------------   -------------------------------------------------------------------  Left ventricle:  The cavity size was normal. Wall thickness was  normal. Systolic function was mildly to moderately reduced. The  estimated ejection fraction was in the range of 40% to 45%.  Although no diagnostic regional wall motion abnormality was  identified, this possibility cannot be completely excluded on the  basis of this study. Features are consistent with a pseudonormal  left ventricular filling pattern, with concomitant abnormal  relaxation and increased filling pressure (grade 2 diastolic  dysfunction).   -------------------------------------------------------------------  Aortic valve:   Trileaflet; mildly thickened, mildly calcified  leaflets. Sclerosis without stenosis.  Doppler:  There was no  significant regurgitation.   -------------------------------------------------------------------  Aorta: The aorta was poorly visualized. Aortic root: The aortic  root was normal in size.  Ascending aorta: The ascending aorta was normal in size.   -------------------------------------------------------------------  Mitral valve:   Mildly thickened leaflets . Leaflet separation was  normal.  Doppler:  Transvalvular velocity was within the normal  range. There was no evidence for stenosis. There was no  regurgitation.   -------------------------------------------------------------------  Left atrium:  The atrium was mildly dilated.   -------------------------------------------------------------------  Right ventricle:  The cavity size was normal. Systolic  function was  normal.   -------------------------------------------------------------------  Pulmonic valve:   Poorly visualized.  The valve appears to be  grossly normal.    Doppler:  There was no significant  regurgitation.   -------------------------------------------------------------------  Tricuspid valve:   Structurally normal valve.   Leaflet separation  was normal.  Doppler:  Transvalvular velocity was within the normal  range. There was no regurgitation.   -------------------------------------------------------------------  Pulmonary artery:    Systolic pressure could not be accurately  estimated.   -------------------------------------------------------------------  Right atrium:  The atrium was normal in size.   -------------------------------------------------------------------  Pericardium: There was no pericardial effusion.   -------------------------------------------------------------------  Systemic veins:  Inferior vena cava: The vessel was normal in size. The  respirophasic diameter changes were in the normal range (= 50%),  consistent with normal central venous pressure.   -------------------------------------------------------------------  Post procedure conclusions  Ascending Aorta:   - The aorta was poorly visualized.      Assessment & Plan   1.  Essential hypertension-BP today 202/145 and on recheck 184/138.  He reports that he has had hypertension for over 10 years.  He has had various medication combinations.  He does have increased stress due to being self-employed.  He did not take his amlodipine  this morning.  He does report that he drinks 2 beers daily and has 1 whiskey.  He drinks 1 cup of coffee each morning.  He enjoys salty snacks.  I will increase his HCTZ and start irbesartan .  Will plan to refer to advanced hypertension clinic and would recommend starting carvedilol as a next antihypertensive agent. Reviewed secondary causes of  hypertension Reduce alcohol intake-recommended  slow downward titration over the next 2 to 3 weeks Maintain blood pressure log-log sheet provided Heart healthy low-sodium diet-salty 6 diet sheet Maintain physical activity Reviewed secondary causes of hypertension-he expressed understanding Continue amlodipine -refilled Stop diovan  Start hydrochlorothiazide  25 mg daily Start irbesartan  150 milligrams daily Order BMP in 1week  Hyperlipidemia-total cholesterol noted to be 210 on 06/25/2020. High-fiber diet Increase physical activity as tolerated Continue atorvastatin   AAA-denies episodes of chest and back discomfort.  CT angio chest showed slight enlargement of ascending thoracic and vesicular abdominal aortic aneurysm which measured 45 mm.  It was previously noted to be 39 mm. Maintain good blood pressure control Avoid straining Following with vascular surgery  Disposition: Follow-up with advanced hypertension clinic in the next 1 to 2 weeks  Gio Janoski M. Deryk Bozman NP-C     07/23/2023, 9:23 AM  [facility] [address] Office [phone] Fax [phone]    I spent 14 minutes examining this patient, reviewing medications, and using patient centered shared decision making involving their cardiac care.   I spent  20 minutes reviewing past medical history,  medications, and prior cardiac tests.

## 2023-07-23 ENCOUNTER — Other Ambulatory Visit: Payer: Self-pay | Admitting: *Deleted

## 2023-07-23 ENCOUNTER — Ambulatory Visit: Attending: General Practice | Admitting: General Practice

## 2023-07-23 ENCOUNTER — Encounter: Payer: Self-pay | Admitting: General Practice

## 2023-07-23 VITALS — BP 184/138 | HR 77 | Ht 69.0 in | Wt 177.0 lb

## 2023-07-23 DIAGNOSIS — I714 Abdominal aortic aneurysm, without rupture, unspecified: Secondary | ICD-10-CM

## 2023-07-23 DIAGNOSIS — I10 Essential (primary) hypertension: Secondary | ICD-10-CM

## 2023-07-23 DIAGNOSIS — E782 Mixed hyperlipidemia: Secondary | ICD-10-CM | POA: Diagnosis not present

## 2023-07-23 MED ORDER — HYDROCHLOROTHIAZIDE 25 MG PO TABS: 25.0000 mg | ORAL_TABLET | Freq: Every day | ORAL | 3 refills | Status: DC

## 2023-07-23 MED ORDER — IRBESARTAN 150 MG PO TABS: 150.0000 mg | ORAL_TABLET | Freq: Every day | ORAL | 1 refills | Status: DC

## 2023-07-23 MED ORDER — AMLODIPINE BESYLATE 10 MG PO TABS: 10.0000 mg | ORAL_TABLET | Freq: Every day | ORAL | 3 refills | Status: DC

## 2023-07-23 NOTE — Patient Instructions (Addendum)
 Medication Instructions:  Your physician has recommended you make the following change in your medication:  START Hydrochlorothiazide  25 mg taking 1 daily  START Irbesartan  150 mg taking 1 daily  STOP the Diovan  / hydrochlorothiazide   *If you need a refill on your cardiac medications before your next appointment, please call your pharmacy*  Lab Work: 1 WEEK GO TO A [facility] NEAR YOU FOR:  BMET  If you have labs (blood work) drawn today and your tests are completely normal, you will receive your results only by: MyChart Message (if you have MyChart) OR A paper copy in the mail If you have any lab test that is abnormal or we need to change your treatment, we will call you to review the results.  Testing/Procedures: None ordered  Follow-Up: At [facility], you and your health needs are our priority.  As part of our continuing mission to provide you with exceptional heart care, our providers are all part of one team.  This team includes your primary Cardiologist (physician) and Advanced Practice Providers or APPs (Physician Assistants and Nurse Practitioners) who all work together to provide you with the care you need, when you need it.  Your next appointment:   2 week(s)  [facility]   Provider:   Annabella Scarce, MD or Reche Finder, NP    We recommend signing up for the patient portal called MyChart.  Sign up information is provided on this After Visit Summary.  MyChart is used to connect with patients for Virtual Visits (Telemedicine).  Patients are able to view lab/test results, encounter notes, upcoming appointments, etc.  Non-urgent messages can be sent to your provider as well.   To learn more about what you can do with MyChart, go to [email or web address].   Other Instructions Your physician has requested that you regularly monitor and record your blood pressure readings at home. Please use the same machine at the same time of day to check your readings and  record them to bring to your follow-up visit.   Please monitor blood pressures and keep a log of your readings.    Make sure to check 1 hours after your medications.    AVOID these things for 30 minutes before checking your blood pressure: No Drinking caffeine. No Drinking alcohol. No Eating. No Smoking. No Exercising.   Five minutes before checking your blood pressure: Pee. Sit in a dining chair. Avoid sitting in a soft couch or armchair. Be quiet. Do not talk  Blood Pressure Record Sheet To take your blood pressure, you will need a blood pressure machine. You may be prescribed one, or you can buy a blood pressure machine (blood pressure monitor) at your clinic, drug store, or online. When choosing one, look for these features: An automatic monitor that has an arm cuff. A cuff that wraps snugly, but not too tightly, around your upper arm. You should be able to fit only one finger between your arm and the cuff. A device that stores blood pressure reading results. Do not choose a monitor that measures your blood pressure from your wrist or finger. Follow your health care provider's instructions for how to take your blood pressure. To use this form: Get one reading in the morning (a.m.) before you take any medicines. Get one reading in the evening (p.m.) before supper. Take at least two readings with each blood pressure check. This makes sure the results are correct. Wait 1-2 minutes between measurements. Write down the results in the spaces on this  form. Repeat this once a week, or as told by your health care provider. Make a follow-up appointment with your health care provider to discuss the results. Blood pressure log Date: _______________________ a.m. _____________________(1st reading) _____________________(2nd reading) p.m. _____________________(1st reading) _____________________(2nd reading) Date: _______________________ a.m. _____________________(1st reading)  _____________________(2nd reading) p.m. _____________________(1st reading) _____________________(2nd reading) Date: _______________________ a.m. _____________________(1st reading) _____________________(2nd reading) p.m. _____________________(1st reading) _____________________(2nd reading) Date: _______________________ a.m. _____________________(1st reading) _____________________(2nd reading) p.m. _____________________(1st reading) _____________________(2nd reading) Date: _______________________ a.m. _____________________(1st reading) _____________________(2nd reading) p.m. _____________________(1st reading) _____________________(2nd reading) This information is not intended to replace advice given to you by your health care provider. Make sure you discuss any questions you have with your health care provider. Document Revised: 09/12/2020 Document Reviewed: 09/12/2020 Elsevier Patient Education  2024 [employer] Hypertension, Adult Hypertension is another name for high blood pressure. High blood pressure forces your heart to work harder to pump blood. This can cause problems over time. There are two numbers in a blood pressure reading. There is a top number (systolic) over a bottom number (diastolic). It is best to have a blood pressure that is below 120/80. What are the causes? The cause of this condition is not known. Some other conditions can lead to high blood pressure. What increases the risk? Some lifestyle factors can make you more likely to develop high blood pressure: Smoking. Not getting enough exercise or physical activity. Being overweight. Having too much fat, sugar, calories, or salt (sodium) in your diet. Drinking too much alcohol. Other risk factors include: Having any of these conditions: Heart disease. Diabetes. High cholesterol. Kidney disease. Obstructive sleep apnea. Having a family history of high blood pressure and high cholesterol. Age. The risk increases  with age. Stress. What are the signs or symptoms? High blood pressure may not cause symptoms. Very high blood pressure (hypertensive crisis) may cause: Headache. Fast or uneven heartbeats (palpitations). Shortness of breath. Nosebleed. Vomiting or feeling like you may vomit (nauseous). Changes in how you see. Very bad chest pain. Feeling dizzy. Seizures. How is this treated? This condition is treated by making healthy lifestyle changes, such as: Eating healthy foods. Exercising more. Drinking less alcohol. Your doctor may prescribe medicine if lifestyle changes do not help enough and if: Your top number is above 130. Your bottom number is above 80. Your personal target blood pressure may vary. Follow these instructions at home: Eating and drinking  If told, follow the DASH eating plan. To follow this plan: Fill one half of your plate at each meal with fruits and vegetables. Fill one fourth of your plate at each meal with whole grains. Whole grains include whole-wheat pasta, brown rice, and whole-grain bread. Eat or drink low-fat dairy products, such as skim milk or low-fat yogurt. Fill one fourth of your plate at each meal with low-fat (lean) proteins. Low-fat proteins include fish, chicken without skin, eggs, beans, and tofu. Avoid fatty meat, cured and processed meat, or chicken with skin. Avoid pre-made or processed food. Limit the amount of salt in your diet to less than 1,500 mg each day. Do not drink alcohol if: Your doctor tells you not to drink. You are pregnant, may be pregnant, or are planning to become pregnant. If you drink alcohol: Limit how much you have to: 0-1 drink a day for women. 0-2 drinks a day for men. Know how much alcohol is in your drink. In the [place] one drink equals one 12 oz bottle of beer (355 mL), one 5 oz glass of wine (148  mL), or one 1 oz glass of hard liquor (44 mL). Lifestyle  Work with your doctor to stay at a healthy weight or to lose  weight. Ask your doctor what the best weight is for you. Get at least 30 minutes of exercise that causes your heart to beat faster (aerobic exercise) most days of the week. This may include walking, swimming, or biking. Get at least 30 minutes of exercise that strengthens your muscles (resistance exercise) at least 3 days a week. This may include lifting weights or doing Pilates. Do not smoke or use any products that contain nicotine or tobacco. If you need help quitting, ask your doctor. Check your blood pressure at home as told by your doctor. Keep all follow-up visits. Medicines Take over-the-counter and prescription medicines only as told by your doctor. Follow directions carefully. Do not skip doses of blood pressure medicine. The medicine does not work as well if you skip doses. Skipping doses also puts you at risk for problems. Ask your doctor about side effects or reactions to medicines that you should watch for. Contact a doctor if: You think you are having a reaction to the medicine you are taking. You have headaches that keep coming back. You feel dizzy. You have swelling in your ankles. You have trouble with your vision. Get help right away if: You get a very bad headache. You start to feel mixed up (confused). You feel weak or numb. You feel faint. You have very bad pain in your: Chest. Belly (abdomen). You vomit more than once. You have trouble breathing. These symptoms may be an emergency. Get help right away. Call 911. Do not wait to see if the symptoms will go away. Do not drive yourself to the hospital. Summary Hypertension is another name for high blood pressure. High blood pressure forces your heart to work harder to pump blood. For most people, a normal blood pressure is less than 120/80. Making healthy choices can help lower blood pressure. If your blood pressure does not get lower with healthy choices, you may need to take medicine. This information is not  intended to replace advice given to you by your health care provider. Make sure you discuss any questions you have with your health care provider. Document Revised: 10/17/2020 Document Reviewed: 10/17/2020 Elsevier Patient Education  2024 Else[employer] Inc[employer]     AVOID NSAIDS  DR. LEVANDER CRANE [phone]  REDUCE ALCOHOL  INCREASE PHYSICAL ACTIVITY  AVOID CAFFEINE, CHOCOLATE, AND WORK ON STRESS REDUCTION

## 2023-08-03 ENCOUNTER — Other Ambulatory Visit (HOSPITAL_BASED_OUTPATIENT_CLINIC_OR_DEPARTMENT_OTHER): Payer: Self-pay

## 2023-08-03 ENCOUNTER — Ambulatory Visit (HOSPITAL_BASED_OUTPATIENT_CLINIC_OR_DEPARTMENT_OTHER): Payer: Self-pay | Admitting: Cardiovascular Disease

## 2023-08-03 ENCOUNTER — Encounter (HOSPITAL_BASED_OUTPATIENT_CLINIC_OR_DEPARTMENT_OTHER): Payer: Self-pay | Admitting: Cardiovascular Disease

## 2023-08-03 VITALS — BP 174/112 | HR 80 | Ht 69.0 in | Wt 176.0 lb

## 2023-08-03 DIAGNOSIS — I5042 Chronic combined systolic (congestive) and diastolic (congestive) heart failure: Secondary | ICD-10-CM | POA: Diagnosis not present

## 2023-08-03 DIAGNOSIS — I7143 Infrarenal abdominal aortic aneurysm, without rupture: Secondary | ICD-10-CM | POA: Diagnosis not present

## 2023-08-03 DIAGNOSIS — I10 Essential (primary) hypertension: Secondary | ICD-10-CM

## 2023-08-03 DIAGNOSIS — G4733 Obstructive sleep apnea (adult) (pediatric): Secondary | ICD-10-CM

## 2023-08-03 DIAGNOSIS — Z5181 Encounter for therapeutic drug level monitoring: Secondary | ICD-10-CM

## 2023-08-03 DIAGNOSIS — F17200 Nicotine dependence, unspecified, uncomplicated: Secondary | ICD-10-CM

## 2023-08-03 LAB — BASIC METABOLIC PANEL WITH GFR
BUN/Creatinine Ratio: 15 (ref 10–24)
BUN: 13 mg/dL (ref 8–27)
CO2: 21 mmol/L (ref 20–29)
Calcium: 9.4 mg/dL (ref 8.6–10.2)
Chloride: 99 mmol/L (ref 96–106)
Creatinine, Ser: 0.87 mg/dL (ref 0.76–1.27)
Glucose: 167 mg/dL — ABNORMAL HIGH (ref 70–99)
Potassium: 4.6 mmol/L (ref 3.5–5.2)
Sodium: 136 mmol/L (ref 134–144)
eGFR: 96 mL/min/1.73 (ref >59)

## 2023-08-03 MED ORDER — SPIRONOLACTONE 25 MG PO TABS
25.0000 mg | ORAL_TABLET | Freq: Every day | ORAL | 3 refills | Status: AC
  Filled 2023-08-03: qty 90, 90d supply, fill #0

## 2023-08-03 MED ORDER — OLMESARTAN-AMLODIPINE-HCTZ 40-10-25 MG PO TABS: 1.0000 | ORAL_TABLET | Freq: Every day | ORAL | 1 refills | Status: DC

## 2023-08-03 MED ORDER — OLMESARTAN-AMLODIPINE-HCTZ 40-10-25 MG PO TABS
1.0000 | ORAL_TABLET | Freq: Every day | ORAL | 1 refills | Status: AC
  Filled 2023-08-03: qty 90, 90d supply, fill #0

## 2023-08-03 NOTE — Patient Instructions (Addendum)
 Medication Instructions:  STOP hydrochlorothiazide   STOP IRBESARTAN    STOP AMLODIPINE     START OLMESARTAN -AMLODIPINE -HCT 40-10-25 MG DAILY   START SPIRONOLACTONE  25 MG DAILY   Labwork: RENIN/ALDOSTERONE TODAY   BMET IN 1 WEEK   Testing/Procedures: Your physician has requested that you have an echocardiogram. Echocardiography is a painless test that uses sound waves to create images of your heart. It provides your doctor with information about the size and shape of your heart and how well your heart's chambers and valves are working. This procedure takes approximately one hour. There are no restrictions for this procedure. Please do NOT wear cologne, perfume, aftershave, or lotions (deodorant is allowed). Please arrive 15 minutes prior to your appointment time.  Please note: We ask at that you not bring children with you during ultrasound (echo/ vascular) testing. Due to room size and safety concerns, children are not allowed in the ultrasound rooms during exams. Our front office staff cannot provide observation of children in our lobby area while testing is being conducted. An adult accompanying a patient to their appointment will only be allowed in the ultrasound room at the discretion of the ultrasound technician under special circumstances. We apologize for any inconvenience.   Your physician has requested that you have a renal artery duplex. During this test, an ultrasound is used to evaluate blood flow to the kidneys. Allow one hour for this exam. Do not eat after midnight the day before and avoid carbonated beverages. Take your medications as you usually do  Follow-Up: 1 TO 2 MONTHS WITH CAITLIN W NP OR KRISTIN A PHARM D   Any Other Special Instructions Will Be Listed Below (If Applicable). MONITOR YOUR BLOOD PRESSURE TWICE A DAY, LOG IN THE BOOK PROVIDED. BRING THE BOOK AND YOUR BLOOD PRESSURE MACHINE TO YOUR FOLLOW UP   If you need a refill on your cardiac medications before  your next appointment, please call your pharmacy.

## 2023-08-03 NOTE — Progress Notes (Signed)
 Advanced Hypertension Clinic Initial Assessment:    Date:  08/03/2023   ID:  Benjamin Hines, DOB 1960/07/09, MRN [id number]  PCP:  Pcp, No  Cardiologist:  Annabella Scarce, MD   Referring MD: Emelia Josefa HERO, NP   CC: Hypertension  History of Present Illness:    Benjamin Hines is a 63 y.o. male with a hx of HFmrEF, AAA, hypertension, hyperlipidemia, and tobacco abuse here to establish care in the Advanced Hypertension Clinic.  He was found to have reduced systolic function in 2019.  He underwent nuclear stress testing which revealed LVEF 35% and a mild fixed defect.  He struggled to control his blood pressure.  He was seen in the ED 06/2023 with chest pain.  Blood pressure was 199/47.  Cardiac enzymes were flat.  Symptoms were thought to be due to hypertensive emergency.  He had to leave due to a funeral and left AGAINST MEDICAL ADVICE.  He had been out of his medications for 2 to 3 weeks due to insurance issues and the need to find a new primary care provider.  CT of the chest, abdomen, and pelvis revealed abdominal aortic aneurysm 4.5X 4.2 cm increased from 2020.  There were no adrenal adenomas.  The study was suboptimal for evaluating vascular anatomy.  He was prescribed labetalol  and valsartan /HCTZ.  He follow-up with Josefa Emelia, NP and reported running out of his amlodipine .  In the office blood pressure was initially 202/145 and on recheck it was 184/138.  He noted a history of OSA but was unable to tolerate CPAP.  Amlodipine  was refilled and he was started on hydrochlorothiazide  and irbesartan .  He was asked to track his blood pressure and follow-up in the advanced hypertension clinic.  Discussed the use of AI scribe software for clinical note transcription with the patient, who gave verbal consent to proceed.  History of Present Illness Benjamin Hines has experienced high blood pressure for the last fifteen years, with a gradual increase over time. Home monitoring shows occasional  spikes, with a recent reading of 190 mmHg three weeks ago. He experiences blurry vision when his blood pressure is elevated, which improves when his blood pressure decreases. No headaches, but he associates stress and heat with his symptoms.  He is currently taking amlodipine , irbesartan , and hydrochlorothiazide  for his hypertension but misses doses at least twice a week, often due to forgetfulness or feeling that the medication makes him feel lethargic. He reports that his vision is a little better and he feels 'like I'm moving at a slow pace' when he takes his medication. He also experiences skin itching, which he associates with his medication.  He has a history of sleep apnea but does not use his CPAP machine due to discomfort with the masks. He feels tired upon waking and experiences sleep paralysis. His diet mainly consists of eating out, with occasional home-cooked meals. He drinks one cup of coffee daily and consumes one to two glasses of bourbon with a cigar after work.  Family history is significant for hypertension in his father, mother, and uncle. His father also had coronary artery disease and underwent open-heart surgery. There is no known family history of strokes or dialysis.  He has a history of a crushed foot, which occasionally becomes numb and painful. No chest pain, pressure, or shortness of breath when lying down, but he mentions knee pain and numbness and pain in his previously crushed foot.  Previous antihypertensives:   Past Medical History:  Diagnosis Date  AAA (abdominal aortic aneurysm) without rupture (HCC) 12/27/2017   followed by vascular-- dr c. gretta,  last duplex in epic 07-23-2020 4.6cm   Arthritis    Chronic combined systolic and diastolic CHF (congestive heart failure) (HCC) 12/2017   Hyperlipidemia    Hypertension    followed by pcp   Nocturia    OSA (obstructive sleep apnea)    pulmonology --- dr shellia----  (11-21-2020  pt stated has not used cpap in 6  months , intolerate)    Past Surgical History:  Procedure Laterality Date   COLONOSCOPY  07/26/2020   by dr stark   CYSTOSCOPY  11/26/2020   Procedure: PHYLLIS SIDE;  Surgeon: Elisabeth Valli BIRCH, MD;  Location: [facility];  Service: Urology;;   FOOT SURGERY Left 2002   fracture  (pt stated no hardware)   RADIOACTIVE SEED IMPLANT N/A 11/26/2020   Procedure: RADIOACTIVE SEED IMPLANT/BRACHYTHERAPY IMPLANT;  Surgeon: Elisabeth Valli BIRCH, MD;  Location: [facility];  Service: Urology;  Laterality: N/A;   SPACE OAR INSTILLATION N/A 11/26/2020   Procedure: SPACE OAR INSTILLATION;  Surgeon: Elisabeth Valli BIRCH, MD;  Location: [facility];  Service: Urology;  Laterality: N/A;    Current Medications: Current Meds  Medication Sig   amLODipine  (NORVASC ) 10 MG tablet Take 1 tablet (10 mg total) by mouth daily.   atorvastatin  (LIPITOR) 40 MG tablet Take 1 tablet (40 mg total) by mouth daily.   cetirizine  (ZYRTEC ) 10 MG tablet Take 10 mg by mouth at bedtime as needed.   fluticasone  (FLONASE ) 50 MCG/ACT nasal spray Place 1 spray into both nostrils daily as needed.   hydrochlorothiazide  (HYDRODIURIL ) 25 MG tablet Take 1 tablet (25 mg total) by mouth daily.   irbesartan  (AVAPRO ) 150 MG tablet Take 1 tablet (150 mg total) by mouth daily.   tadalafil  (CIALIS ) 5 MG tablet Take 1 tablet (5 mg total) by mouth daily as needed for erectile dysfunction.   tamsulosin  (FLOMAX ) 0.4 MG CAPS capsule Take 0.4 mg by mouth daily.   thiamine  (VITAMIN B-1) 100 MG tablet Take 100 mg by mouth daily.     Allergies:   Patient has no known allergies.   Social History   Socioeconomic History   Marital status: Married    Spouse name: Not on file   Number of children: Not on file   Years of education: Not on file   Highest education level: Not on file  Occupational History   Not on file  Tobacco Use   Smoking status: Some Days    Current packs/day: 1.00    Average  packs/day: 1 pack/day for 30.0 years (30.0 ttl pk-yrs)    Types: Cigarettes, Cigars    Passive exposure: Past   Smokeless tobacco: Never   Tobacco comments:    11-21-2020  pt stated stopped smoking cigarettes 2012 for 20 yrs ,  since then occasional cigar  Vaping Use   Vaping status: Never Used  Substance and Sexual Activity   Alcohol use: Not Currently    Alcohol/week: 21.0 standard drinks of alcohol    Types: 21 Cans of beer per week    Comment: 11-21-2020 pt stated 3 beers daily (12 oz)   Drug use: Never   Sexual activity: Yes  Other Topics Concern   Not on file  Social History Narrative   Not on file   Social Drivers of Health   Financial Resource Strain: Low Risk  (02/14/2021)   Overall Financial Resource Strain (CARDIA)  Difficulty of Paying Living Expenses: Not very hard  Food Insecurity: No Food Insecurity (02/14/2021)   Hunger Vital Sign    Worried About Running Out of Food in the Last Year: Never true    Ran Out of Food in the Last Year: Never true  Transportation Needs: No Transportation Needs (02/14/2021)   PRAPARE - Administrator, Civil Service (Medical): No    Lack of Transportation (Non-Medical): No  Physical Activity: Inactive (02/14/2021)   Exercise Vital Sign    Days of Exercise per Week: 0 days    Minutes of Exercise per Session: 0 min  Stress: No Stress Concern Present (02/14/2021)   [employer] of Occupational Health - Occupational Stress Questionnaire    Feeling of Stress : Not at all  Social Connections: Moderately Isolated (02/14/2021)   Social Connection and Isolation Panel    Frequency of Communication with Friends and Family: More than three times a week    Frequency of Social Gatherings with Friends and Family: Three times a week    Attends Religious Services: Never    Active Member of Clubs or Organizations: No    Attends Banker Meetings: Never    Marital Status: Married     Family History: The patient's family  history includes CAD in his father; Dementia in his mother; Hypertension in his father and mother. There is no history of Colon cancer, Colon polyps, Esophageal cancer, Rectal cancer, or Stomach cancer.  ROS:   Please see the history of present illness.     All other systems reviewed and are negative.  EKGs/Labs/Other Studies Reviewed:    EKG:  EKG is not ordered today.    Recent Labs: 06/21/2023: BUN 12; Creatinine, Ser 0.88; Hemoglobin 15.6; Platelets 178; Potassium 3.9; Sodium 137   Recent Lipid Panel    Component Value Date/Time   CHOL 210 (H) 06/25/2020 0000   TRIG 596 (H) 06/25/2020 0000   HDL 43 06/25/2020 0000   CHOLHDL 4.9 06/25/2020 0000   VLDL 41.4 (H) 01/27/2019 0909   LDLCALC  06/25/2020 0000     Comment:     . LDL cholesterol not calculated. Triglyceride levels greater than 400 mg/dL invalidate calculated LDL results. . Reference range: <100 . Desirable range <100 mg/dL for primary prevention;   <70 mg/dL for patients with CHD or diabetic patients  with > or = 2 CHD risk factors. SABRA LDL-C is now calculated using the Martin-Hopkins  calculation, which is a validated novel method providing  better accuracy than the Friedewald equation in the  estimation of LDL-C.  Gladis APPLETHWAITE et al. SANDREA. 7986;689(80): 2061-2068  (http://education.[facility].com/faq/FAQ164)    LDLDIRECT 108.0 01/27/2019 0909    Physical Exam:   VS:  BP (!) 174/112   Pulse 80   Ht 5' 9 (1.753 m)   Wt 176 lb (79.8 kg)   SpO2 97%   BMI 25.99 kg/m  , BMI Body mass index is 25.99 kg/m. GENERAL:  Well appearing HEENT: Pupils equal round and reactive, fundi not visualized, oral mucosa unremarkable NECK:  No jugular venous distention, waveform within normal limits, carotid upstroke brisk and symmetric, no bruits, no thyromegaly LUNGS:  Clear to auscultation bilaterally HEART:  RRR.  PMI not displaced or sustained,S1 and S2 within normal limits, no S3, no S4, no clicks, no rubs, no  murmurs ABD:  Flat, positive bowel sounds normal in frequency in pitch, no bruits, no rebound, no guarding, no midline pulsatile mass, no hepatomegaly, no splenomegaly EXT:  2 plus pulses throughout, no edema, no cyanosis no clubbing SKIN:  No rashes no nodules NEURO:  Cranial nerves II through XII grossly intact, motor grossly intact throughout PSYCH:  Cognitively intact, oriented to person place and time   ASSESSMENT/PLAN:    Assessment & Plan # Resistant Hypertension Resistant hypertension with BP up to 190 mmHg, non-adherence due to side effects. BP control crucial for racing certification. - Emphasized medication adherence and lifestyle changes, including diet and exercise. - Advised reducing salt intake to <2300 mg/day, encouraged regular exercise. - Considered combination pills, add spironolactone  for BP control. - Check combination pill availability and cost with pharmacy. - Add spironolactone  to regimen. - Provided BP tracking book, instructed to record readings. - Schedule follow-up in hypertension clinic in 1-2 months with pharmacist or nurse practitioner. - Order renin, aldosterone levels, and TSH today. - Schedule basic metabolic panel one week after starting spironolactone .  # HFmrEF:  Heart failure with reduced ejection fraction noted in 2019, currently asymptomatic. - Order echocardiogram to assess current heart function.  # Sleep Apnea Sleep apnea with CPAP intolerance, causing fatigue and sleep paralysis.  Consider Inspire.     Screening for Secondary Hypertension:     08/03/2023    8:36 AM  Causes  Drugs/Herbals Screened     - Comments high sodium diet.  One coffee daily.  +EtOH, 1 cigar daily  Renovascular HTN Screened     - Comments check renal Dopplers  Sleep Apnea Screened     - Comments doesn't tolerate CPAP  Thyroid  Disease Screened  Hyperaldosteronism Screened     - Comments check renin/aldo today  Pheochromocytoma N/A  Cushing's Syndrome N/A   Hyperparathyroidism Screened  Coarctation of the Aorta Screened     - Comments BP symmetric  Compliance Screened     - Comments misses BP meds at least twice per week    Relevant Labs/Studies:    Latest Ref Rng & Units 06/21/2023    3:38 AM 11/22/2020    8:59 AM 03/06/2020   12:00 AM  Basic Labs  Sodium 135 - 145 mmol/L 137  139  141   Potassium 3.5 - 5.1 mmol/L 3.9  3.9  4.3   Creatinine 0.61 - 1.24 mg/dL 9.11  9.12  9.00        Latest Ref Rng & Units 03/06/2020   12:00 AM 02/23/2017   11:30 AM  Thyroid    TSH 0.40 - 4.50 mIU/L 1.96  1.370     Disposition:    FU with MD/PharmD in 1-2 months    Medication Adjustments/Labs and Tests Ordered: Current medicines are reviewed at length with the patient today.  Concerns regarding medicines are outlined above.  Orders Placed This Encounter  Procedures   Aldosterone + renin activity w/ ratio   Basic metabolic panel with GFR   ECHOCARDIOGRAM COMPLETE   VAS US  RENAL ARTERY DUPLEX   No orders of the defined types were placed in this encounter.    Signed, Annabella Scarce, MD  08/03/2023 9:12 AM    [facility]

## 2023-08-04 ENCOUNTER — Ambulatory Visit: Payer: Self-pay | Admitting: General Practice

## 2023-08-07 LAB — ALDOSTERONE + RENIN ACTIVITY W/ RATIO
Aldos/Renin Ratio: 2.3 (ref 0.0–30.0)
Aldosterone: 8.4 ng/dL (ref 0.0–30.0)
Renin Activity, Plasma: 3.673 ng/mL/h (ref 0.167–5.380)

## 2023-08-13 ENCOUNTER — Ambulatory Visit: Payer: Self-pay | Admitting: Cardiovascular Disease

## 2023-09-01 ENCOUNTER — Encounter (HOSPITAL_BASED_OUTPATIENT_CLINIC_OR_DEPARTMENT_OTHER)

## 2023-09-01 ENCOUNTER — Other Ambulatory Visit (HOSPITAL_BASED_OUTPATIENT_CLINIC_OR_DEPARTMENT_OTHER)

## 2023-09-06 ENCOUNTER — Ambulatory Visit (HOSPITAL_BASED_OUTPATIENT_CLINIC_OR_DEPARTMENT_OTHER)

## 2023-09-06 ENCOUNTER — Ambulatory Visit (INDEPENDENT_AMBULATORY_CARE_PROVIDER_SITE_OTHER)

## 2023-09-06 DIAGNOSIS — I10 Essential (primary) hypertension: Secondary | ICD-10-CM

## 2023-09-06 DIAGNOSIS — I5042 Chronic combined systolic (congestive) and diastolic (congestive) heart failure: Secondary | ICD-10-CM

## 2023-09-08 LAB — ECHOCARDIOGRAM COMPLETE
AR max vel: 1.79 cm2
AV Area VTI: 1.86 cm2
AV Area mean vel: 1.71 cm2
AV Mean grad: 6 mmHg
AV Peak grad: 10.8 mmHg
Ao pk vel: 1.64 m/s
Area-P 1/2: 3 cm2
Est EF: 50
S' Lateral: 3.24 cm

## 2023-10-01 NOTE — Progress Notes (Deleted)
 Office Visit    Patient Name: Benjamin Hines Date of Encounter: 10/01/2023  Primary Care Provider:  Pcp, No Primary Cardiologist:  Annabella Scarce, MD  Chief Complaint    Hypertension - Advanced hypertension clinic  Past Medical History   HFmrEF Most recent EF 50% by echo 8/25  HLD Last labs 2022, elevated LDL (124), TG (596)  OSA Did not tolerate CPAP    No Known Allergies  History of Present Illness    Benjamin Hines is a 63 y.o. male patient who was referred to the Advanced Hypertension Clinic for long term elevated BP, gradually increasing over time.  He reports blurred vision with elevated readings, which improves as pressure drops.  When he saw Dr. Scarce in July, his office pressure was 174/112.  He was encouraged to work on medication compliance, reducing dietary sodium  and exercise.    Blood Pressure Goal:  130/80  Current Medications: olmesartan -amlodipine -hctz 40/10/25 daily, spironolactone  25 mg daily  Adherence Assessment  Do you ever forget to take your medication? [x] Yes - forgetfulness [] No  Do you ever skip doses due to side effects? [x] Yes - meds make him feel lethargic, skin itching [] No  Do you have trouble affording your medicines? [] Yes [] No  Are you ever unable to pick up your medication due to transportation difficulties? [] Yes [] No  Do you ever stop taking your medications because you don't believe they are helping? [] Yes [] No  Do you check your weight daily? [] Yes [] No   Adherence strategy: ***  Barriers to obtaining medications: ***  Previously tried:     Family Hx: both parents had hypertension as did uncle, father had CABG   Social Hx:      Tobacco: cigar with bourbon  Alcohol: bourbon after work  Caffeine: 1 cup of coffee daily  Diet:   more eating out than home cooked   Exercise:   Home BP readings:       Accessory Clinical Findings    Lab Results  Component Value Date   CREATININE 0.87 08/03/2023   BUN 13  08/03/2023   NA 136 08/03/2023   K 4.6 08/03/2023   CL 99 08/03/2023   CO2 21 08/03/2023   Lab Results  Component Value Date   ALT 26 11/22/2020   AST 29 11/22/2020   ALKPHOS 78 11/22/2020   BILITOT 0.8 11/22/2020   No results found for: HGBA1C  Screening for Secondary Hypertension: { Click here to document screening for secondary causes of HTN  :1}     08/03/2023    8:36 AM  Causes  Drugs/Herbals Screened     - Comments high sodium diet.  One coffee daily.  +EtOH, 1 cigar daily  Renovascular HTN Screened     - Comments check renal Dopplers  Sleep Apnea Screened     - Comments doesn't tolerate CPAP  Thyroid  Disease Screened  Hyperaldosteronism Screened     - Comments check renin/aldo today  Pheochromocytoma N/A  Cushing's Syndrome N/A  Hyperparathyroidism Screened  Coarctation of the Aorta Screened     - Comments BP symmetric  Compliance Screened     - Comments misses BP meds at least twice per week    Relevant Labs/Studies:    Latest Ref Rng & Units 08/03/2023   11:10 AM 06/21/2023    3:38 AM 11/22/2020    8:59 AM  Basic Labs  Sodium 134 - 144 mmol/L 136  137  139   Potassium 3.5 - 5.2 mmol/L 4.6  3.9  3.9   Creatinine 0.76 - 1.27 mg/dL 9.12  9.11  9.12        Latest Ref Rng & Units 03/06/2020   12:00 AM 02/23/2017   11:30 AM  Thyroid    TSH 0.40 - 4.50 mIU/L 1.96  1.370        Latest Ref Rng & Units 08/03/2023    9:32 AM  Renin/Aldosterone   Aldosterone 0.0 - 30.0 ng/dL 8.4   Aldos/Renin Ratio 0.0 - 30.0 2.3              09/06/2023   11:37 AM  Renovascular   Renal Artery US  Completed Yes      Home Medications    Current Outpatient Medications  Medication Sig Dispense Refill   atorvastatin  (LIPITOR) 40 MG tablet Take 1 tablet (40 mg total) by mouth daily. 30 tablet 0   cetirizine  (ZYRTEC ) 10 MG tablet Take 10 mg by mouth at bedtime as needed.     fluticasone  (FLONASE ) 50 MCG/ACT nasal spray Place 1 spray into both nostrils daily as needed.      Olmesartan -amLODIPine -HCTZ 40-10-25 MG TABS Take 1 tablet by mouth daily. 90 tablet 1   spironolactone  (ALDACTONE ) 25 MG tablet Take 1 tablet (25 mg total) by mouth daily. 90 tablet 3   tadalafil  (CIALIS ) 5 MG tablet Take 1 tablet (5 mg total) by mouth daily as needed for erectile dysfunction. 30 tablet 11   tamsulosin  (FLOMAX ) 0.4 MG CAPS capsule Take 0.4 mg by mouth daily.     thiamine  (VITAMIN B-1) 100 MG tablet Take 100 mg by mouth daily.     No current facility-administered medications for this visit.     Assessment & Plan   No BP recorded.  {Refresh Note OR Click here to enter BP  :1}***   No problem-specific Assessment & Plan notes found for this encounter.   Keanen Dohse PharmD CPP [facility]  [address] [place], [place] [address] [phone]

## 2023-10-04 ENCOUNTER — Ambulatory Visit (HOSPITAL_BASED_OUTPATIENT_CLINIC_OR_DEPARTMENT_OTHER): Admitting: Pharmacist Clinician (PhC)/ Clinical Pharmacy Specialist

## 2023-12-15 ENCOUNTER — Emergency Department (HOSPITAL_COMMUNITY)

## 2023-12-15 ENCOUNTER — Other Ambulatory Visit: Payer: Self-pay

## 2023-12-15 ENCOUNTER — Encounter (HOSPITAL_COMMUNITY): Payer: Self-pay | Admitting: Emergency Medicine

## 2023-12-15 ENCOUNTER — Inpatient Hospital Stay (HOSPITAL_COMMUNITY)
Admission: EM | Admit: 2023-12-15 | Discharge: 2023-12-17 | DRG: 280 | Disposition: A | Attending: Hospitalist | Admitting: Hospitalist

## 2023-12-15 DIAGNOSIS — R7989 Other specified abnormal findings of blood chemistry: Principal | ICD-10-CM

## 2023-12-15 DIAGNOSIS — J189 Pneumonia, unspecified organism: Secondary | ICD-10-CM

## 2023-12-15 DIAGNOSIS — I160 Hypertensive urgency: Secondary | ICD-10-CM | POA: Diagnosis present

## 2023-12-15 LAB — BASIC METABOLIC PANEL WITH GFR
Anion gap: 11 (ref 5–15)
BUN: 11 mg/dL (ref 8–23)
CO2: 22 mmol/L (ref 22–32)
Calcium: 9 mg/dL (ref 8.9–10.3)
Chloride: 107 mmol/L (ref 98–111)
Creatinine, Ser: 0.88 mg/dL (ref 0.61–1.24)
GFR, Estimated: >60 mL/min (ref >60)
Glucose, Bld: 109 mg/dL — ABNORMAL HIGH (ref 70–99)
Potassium: 3.7 mmol/L (ref 3.5–5.1)
Sodium: 140 mmol/L (ref 135–145)

## 2023-12-15 LAB — CBC
HCT: 48.2 % (ref 39.0–52.0)
Hemoglobin: 15.7 g/dL (ref 13.0–17.0)
MCH: 30.1 pg (ref 26.0–34.0)
MCHC: 32.6 g/dL (ref 30.0–36.0)
MCV: 92.3 fL (ref 80.0–100.0)
Platelets: 192 K/uL (ref 150–400)
RBC: 5.22 MIL/uL (ref 4.22–5.81)
RDW: 12.4 % (ref 11.5–15.5)
WBC: 9.3 K/uL (ref 4.0–10.5)
nRBC: 0 % (ref 0.0–0.2)

## 2023-12-15 LAB — RESP PANEL BY RT-PCR (RSV, FLU A&B, COVID)  RVPGX2
Influenza A by PCR: NEGATIVE
Influenza B by PCR: NEGATIVE
Resp Syncytial Virus by PCR: NEGATIVE
SARS Coronavirus 2 by RT PCR: NEGATIVE

## 2023-12-15 NOTE — ED Notes (Signed)
 Pt noted to be hypertensive, reports hx of the same and did not take his medication

## 2023-12-15 NOTE — ED Triage Notes (Signed)
 Quick triage note: Pt to ED c/o flu symptoms x 1 week. Reports cough, body aches, sore throat, congestion.

## 2023-12-15 NOTE — ED Triage Notes (Signed)
 Pt reports chest congestion, cough, sneezing for a week. Pt HTN in triage and reports he has not taken HTN meds.

## 2023-12-16 ENCOUNTER — Emergency Department (HOSPITAL_COMMUNITY)

## 2023-12-16 DIAGNOSIS — I21A1 Myocardial infarction type 2: Secondary | ICD-10-CM | POA: Diagnosis present

## 2023-12-16 DIAGNOSIS — J40 Bronchitis, not specified as acute or chronic: Secondary | ICD-10-CM | POA: Diagnosis present

## 2023-12-16 DIAGNOSIS — I160 Hypertensive urgency: Secondary | ICD-10-CM | POA: Diagnosis not present

## 2023-12-16 DIAGNOSIS — G4733 Obstructive sleep apnea (adult) (pediatric): Secondary | ICD-10-CM | POA: Diagnosis present

## 2023-12-16 DIAGNOSIS — F17210 Nicotine dependence, cigarettes, uncomplicated: Secondary | ICD-10-CM | POA: Diagnosis present

## 2023-12-16 DIAGNOSIS — I110 Hypertensive heart disease with heart failure: Secondary | ICD-10-CM | POA: Diagnosis present

## 2023-12-16 DIAGNOSIS — Z8249 Family history of ischemic heart disease and other diseases of the circulatory system: Secondary | ICD-10-CM | POA: Diagnosis not present

## 2023-12-16 DIAGNOSIS — E785 Hyperlipidemia, unspecified: Secondary | ICD-10-CM | POA: Diagnosis present

## 2023-12-16 DIAGNOSIS — J189 Pneumonia, unspecified organism: Secondary | ICD-10-CM | POA: Diagnosis present

## 2023-12-16 DIAGNOSIS — Z1152 Encounter for screening for COVID-19: Secondary | ICD-10-CM | POA: Diagnosis not present

## 2023-12-16 DIAGNOSIS — Z23 Encounter for immunization: Secondary | ICD-10-CM | POA: Diagnosis present

## 2023-12-16 DIAGNOSIS — T465X6A Underdosing of other antihypertensive drugs, initial encounter: Secondary | ICD-10-CM | POA: Diagnosis present

## 2023-12-16 DIAGNOSIS — N400 Enlarged prostate without lower urinary tract symptoms: Secondary | ICD-10-CM | POA: Diagnosis present

## 2023-12-16 DIAGNOSIS — Z91148 Patient's other noncompliance with medication regimen for other reason: Secondary | ICD-10-CM | POA: Diagnosis not present

## 2023-12-16 DIAGNOSIS — I161 Hypertensive emergency: Secondary | ICD-10-CM | POA: Diagnosis present

## 2023-12-16 DIAGNOSIS — I5042 Chronic combined systolic (congestive) and diastolic (congestive) heart failure: Secondary | ICD-10-CM | POA: Diagnosis present

## 2023-12-16 DIAGNOSIS — Z79899 Other long term (current) drug therapy: Secondary | ICD-10-CM | POA: Diagnosis not present

## 2023-12-16 LAB — TROPONIN I (HIGH SENSITIVITY)
Troponin I (High Sensitivity): 51 ng/L — ABNORMAL HIGH (ref <18)
Troponin I (High Sensitivity): 52 ng/L — ABNORMAL HIGH (ref <18)

## 2023-12-16 LAB — HIV ANTIBODY (ROUTINE TESTING W REFLEX): HIV Screen 4th Generation wRfx: NONREACTIVE

## 2023-12-16 LAB — CBC
HCT: 47.1 % (ref 39.0–52.0)
Hemoglobin: 15.7 g/dL (ref 13.0–17.0)
MCH: 30.3 pg (ref 26.0–34.0)
MCHC: 33.3 g/dL (ref 30.0–36.0)
MCV: 90.9 fL (ref 80.0–100.0)
Platelets: 180 K/uL (ref 150–400)
RBC: 5.18 MIL/uL (ref 4.22–5.81)
RDW: 12.5 % (ref 11.5–15.5)
WBC: 9.4 K/uL (ref 4.0–10.5)
nRBC: 0 % (ref 0.0–0.2)

## 2023-12-16 LAB — BASIC METABOLIC PANEL WITH GFR
Anion gap: 10 (ref 5–15)
BUN: 11 mg/dL (ref 8–23)
CO2: 24 mmol/L (ref 22–32)
Calcium: 8.4 mg/dL — ABNORMAL LOW (ref 8.9–10.3)
Chloride: 104 mmol/L (ref 98–111)
Creatinine, Ser: 0.88 mg/dL (ref 0.61–1.24)
GFR, Estimated: >60 mL/min (ref >60)
Glucose, Bld: 110 mg/dL — ABNORMAL HIGH (ref 70–99)
Potassium: 3.6 mmol/L (ref 3.5–5.1)
Sodium: 138 mmol/L (ref 135–145)

## 2023-12-16 LAB — CORTISOL-AM, BLOOD: Cortisol - AM: 5.8 ug/dL — ABNORMAL LOW (ref 6.7–22.6)

## 2023-12-16 LAB — BRAIN NATRIURETIC PEPTIDE: B Natriuretic Peptide: 205.6 pg/mL — ABNORMAL HIGH (ref 0.0–100.0)

## 2023-12-16 LAB — PROTIME-INR
INR: 0.9 (ref 0.8–1.2)
Prothrombin Time: 13.2 s (ref 11.4–15.2)

## 2023-12-16 MED ORDER — AMLODIPINE BESYLATE 10 MG PO TABS
10.0000 mg | ORAL_TABLET | Freq: Every day | ORAL | Status: DC
  Administered 2023-12-16 – 2023-12-17 (×2): 10 mg via ORAL
  Filled 2023-12-16: qty 1
  Filled 2023-12-16: qty 2

## 2023-12-16 MED ORDER — HYDRALAZINE HCL 20 MG/ML IJ SOLN
10.0000 mg | Freq: Four times a day (QID) | INTRAMUSCULAR | Status: DC | PRN
  Administered 2023-12-17: 10 mg via INTRAVENOUS
  Filled 2023-12-16 (×2): qty 1

## 2023-12-16 MED ORDER — IOHEXOL 350 MG/ML SOLN
100.0000 mL | Freq: Once | INTRAVENOUS | Status: AC | PRN
  Administered 2023-12-16: 100 mL via INTRAVENOUS

## 2023-12-16 MED ORDER — AZITHROMYCIN 250 MG PO TABS
500.0000 mg | ORAL_TABLET | Freq: Every day | ORAL | Status: DC
  Administered 2023-12-16: 500 mg via ORAL
  Filled 2023-12-16: qty 2

## 2023-12-16 MED ORDER — ATORVASTATIN CALCIUM 40 MG PO TABS
40.0000 mg | ORAL_TABLET | Freq: Every day | ORAL | Status: DC
  Administered 2023-12-16 – 2023-12-17 (×2): 40 mg via ORAL
  Filled 2023-12-16 (×2): qty 1

## 2023-12-16 MED ORDER — SODIUM CHLORIDE 0.9 % IV SOLN
1.0000 g | INTRAVENOUS | Status: DC
  Administered 2023-12-16: 1 g via INTRAVENOUS
  Filled 2023-12-16: qty 10

## 2023-12-16 MED ORDER — SODIUM CHLORIDE 0.9 % IV SOLN: 2.0000 g | INTRAVENOUS | Status: DC

## 2023-12-16 MED ORDER — THIAMINE MONONITRATE 100 MG PO TABS
100.0000 mg | ORAL_TABLET | Freq: Every day | ORAL | Status: DC
  Administered 2023-12-16 – 2023-12-17 (×2): 100 mg via ORAL
  Filled 2023-12-16 (×2): qty 1

## 2023-12-16 MED ORDER — HYDROCHLOROTHIAZIDE 25 MG PO TABS
25.0000 mg | ORAL_TABLET | Freq: Every day | ORAL | Status: DC
  Administered 2023-12-16 – 2023-12-17 (×2): 25 mg via ORAL
  Filled 2023-12-16 (×2): qty 1

## 2023-12-16 MED ORDER — IRBESARTAN 150 MG PO TABS
300.0000 mg | ORAL_TABLET | Freq: Every day | ORAL | Status: DC
  Administered 2023-12-16 – 2023-12-17 (×2): 300 mg via ORAL
  Filled 2023-12-16: qty 1
  Filled 2023-12-16: qty 2

## 2023-12-16 MED ORDER — SODIUM CHLORIDE 0.9 % IV SOLN
1.0000 g | Freq: Once | INTRAVENOUS | Status: AC
  Administered 2023-12-16: 1 g via INTRAVENOUS
  Filled 2023-12-16: qty 10

## 2023-12-16 MED ORDER — OLMESARTAN-AMLODIPINE-HCTZ 40-10-25 MG PO TABS: 1.0000 | ORAL_TABLET | Freq: Every day | ORAL | Status: DC

## 2023-12-16 MED ORDER — ACETAMINOPHEN 325 MG PO TABS
650.0000 mg | ORAL_TABLET | Freq: Four times a day (QID) | ORAL | Status: DC | PRN
  Administered 2023-12-16 – 2023-12-17 (×2): 650 mg via ORAL
  Filled 2023-12-16 (×2): qty 2

## 2023-12-16 MED ORDER — ONDANSETRON HCL 4 MG PO TABS: 4.0000 mg | ORAL_TABLET | Freq: Four times a day (QID) | ORAL | Status: DC | PRN

## 2023-12-16 MED ORDER — MAGNESIUM HYDROXIDE 400 MG/5ML PO SUSP: 30.0000 mL | Freq: Every day | ORAL | Status: DC | PRN

## 2023-12-16 MED ORDER — AZITHROMYCIN 250 MG PO TABS: 500.0000 mg | ORAL_TABLET | Freq: Every day | ORAL | Status: DC

## 2023-12-16 MED ORDER — ENOXAPARIN SODIUM 40 MG/0.4ML IJ SOSY
40.0000 mg | PREFILLED_SYRINGE | INTRAMUSCULAR | Status: DC
  Administered 2023-12-16 – 2023-12-17 (×2): 40 mg via SUBCUTANEOUS
  Filled 2023-12-16 (×2): qty 0.4

## 2023-12-16 MED ORDER — SODIUM CHLORIDE 0.9 % IV SOLN
500.0000 mg | INTRAVENOUS | Status: DC
  Administered 2023-12-16: 500 mg via INTRAVENOUS
  Filled 2023-12-16: qty 5

## 2023-12-16 MED ORDER — HYDRALAZINE HCL 20 MG/ML IJ SOLN
5.0000 mg | Freq: Once | INTRAMUSCULAR | Status: AC
  Administered 2023-12-16: 5 mg via INTRAVENOUS
  Filled 2023-12-16: qty 1

## 2023-12-16 MED ORDER — SPIRONOLACTONE 25 MG PO TABS
25.0000 mg | ORAL_TABLET | Freq: Every day | ORAL | Status: DC
  Administered 2023-12-16 – 2023-12-17 (×2): 25 mg via ORAL
  Filled 2023-12-16 (×2): qty 1

## 2023-12-16 MED ORDER — ONDANSETRON HCL 4 MG/2ML IJ SOLN: 4.0000 mg | Freq: Four times a day (QID) | INTRAMUSCULAR | Status: DC | PRN

## 2023-12-16 MED ORDER — ACETAMINOPHEN 650 MG RE SUPP: 650.0000 mg | Freq: Four times a day (QID) | RECTAL | Status: DC | PRN

## 2023-12-16 MED ORDER — SODIUM CHLORIDE 0.9 % IV SOLN: 500.0000 mg | Freq: Once | INTRAVENOUS | Status: DC

## 2023-12-16 MED ORDER — TRAZODONE HCL 50 MG PO TABS
25.0000 mg | ORAL_TABLET | Freq: Every evening | ORAL | Status: DC | PRN
  Filled 2023-12-16: qty 1

## 2023-12-16 MED ORDER — ASPIRIN 81 MG PO CHEW
324.0000 mg | CHEWABLE_TABLET | Freq: Once | ORAL | Status: AC
  Administered 2023-12-16: 324 mg via ORAL
  Filled 2023-12-16: qty 4

## 2023-12-16 MED ORDER — LABETALOL HCL 5 MG/ML IV SOLN
20.0000 mg | INTRAVENOUS | Status: DC | PRN
  Administered 2023-12-16: 20 mg via INTRAVENOUS
  Filled 2023-12-16: qty 4

## 2023-12-16 MED ORDER — TAMSULOSIN HCL 0.4 MG PO CAPS
0.4000 mg | ORAL_CAPSULE | Freq: Every day | ORAL | Status: DC
  Administered 2023-12-16 – 2023-12-17 (×2): 0.4 mg via ORAL
  Filled 2023-12-16 (×2): qty 1

## 2023-12-16 MED ORDER — ORAL CARE MOUTH RINSE: 15.0000 mL | OROMUCOSAL | Status: DC | PRN

## 2023-12-16 MED ORDER — LACTATED RINGERS IV SOLN
150.0000 mL/h | INTRAVENOUS | Status: DC
  Administered 2023-12-16: 150 mL/h via INTRAVENOUS

## 2023-12-16 NOTE — ED Provider Notes (Signed)
 [place] [facility] Provider Note   CSN: [id number] Arrival date & time: 12/15/23  1752     Patient presents with: flu sx  HPI Benjamin Hines is a 63 y.o. male with AAA, hypertension, hyperlipidemia presenting for cough chest congestion and chest soreness.  Symptoms have been going on for a week.  He is not sure when the chest pains started but it is worse with coughing.  In the center of his chest and nonradiating otherwise.  Also has had nasal congestion and sneezing.  Denies fever.  Also reports that he has not taken his blood pressure medicine in the last 3 days because he was not feeling well.     HPI     Prior to Admission medications   Medication Sig Start Date End Date Taking? Authorizing Provider  cetirizine  (ZYRTEC ) 10 MG tablet Take 10 mg by mouth at bedtime as needed for allergies. 04/25/20  Yes [provider]  fluticasone  (FLONASE ) 50 MCG/ACT nasal spray Place 1 spray into both nostrils daily as needed. 03/06/20  Yes [provider]  Olmesartan -amLODIPine -HCTZ 40-10-25 MG TABS Take 1 tablet by mouth daily. 08/03/23  Yes Raford Riggs, MD  spironolactone  (ALDACTONE ) 25 MG tablet Take 1 tablet (25 mg total) by mouth daily. 08/03/23  Yes Raford Riggs, MD  atorvastatin  (LIPITOR) 40 MG tablet Take 1 tablet (40 mg total) by mouth daily. Patient not taking: Reported on 12/16/2023 09/06/20   Micheal Wolm ORN, MD  tamsulosin  (FLOMAX ) 0.4 MG CAPS capsule Take 0.4 mg by mouth daily. Patient not taking: Reported on 12/16/2023    [provider]  thiamine  (VITAMIN B-1) 100 MG tablet Take 100 mg by mouth daily. Patient not taking: Reported on 12/16/2023    [provider]    Allergies: Patient has no known allergies.    Review of Systems See HPI  Updated Vital Signs BP (!) 168/118 (BP Location: Right Arm)   Pulse 85   Temp 97.9 F (36.6 C) (Oral)   Resp 20   Ht 5' 9 (1.753 m)   Wt 79 kg   SpO2  100%   BMI 25.72 kg/m   Physical Exam Vitals and nursing note reviewed.  HENT:     Head: Normocephalic and atraumatic.     Mouth/Throat:     Mouth: Mucous membranes are moist.  Eyes:     General:        Right eye: No discharge.        Left eye: No discharge.     Conjunctiva/sclera: Conjunctivae normal.  Cardiovascular:     Rate and Rhythm: Normal rate and regular rhythm.     Pulses: Normal pulses.     Heart sounds: Normal heart sounds.  Pulmonary:     Effort: Pulmonary effort is normal.     Breath sounds: Normal breath sounds.  Abdominal:     General: Abdomen is flat.     Palpations: Abdomen is soft.  Skin:    General: Skin is warm and dry.  Neurological:     General: No focal deficit present.  Psychiatric:        Mood and Affect: Mood normal.     (all labs ordered are listed, but only abnormal results are displayed) Labs Reviewed  BASIC METABOLIC PANEL WITH GFR - Abnormal; Notable for the following components:      Result Value   Glucose, Bld 109 (*)    All other components within normal limits  BASIC METABOLIC  PANEL WITH GFR - Abnormal; Notable for the following components:   Glucose, Bld 110 (*)    Calcium  8.4 (*)    All other components within normal limits  BRAIN NATRIURETIC PEPTIDE - Abnormal; Notable for the following components:   B Natriuretic Peptide 205.6 (*)    All other components within normal limits  CORTISOL-AM, BLOOD - Abnormal; Notable for the following components:   Cortisol - AM 5.8 (*)    All other components within normal limits  TROPONIN I (HIGH SENSITIVITY) - Abnormal; Notable for the following components:   Troponin I (High Sensitivity) 51 (*)    All other components within normal limits  TROPONIN I (HIGH SENSITIVITY) - Abnormal; Notable for the following components:   Troponin I (High Sensitivity) 52 (*)    All other components within normal limits  RESP PANEL BY RT-PCR (RSV, FLU A&B, COVID)  RVPGX2  CBC  HIV ANTIBODY (ROUTINE  TESTING W REFLEX)  CBC  PROTIME-INR    EKG: EKG Interpretation Date/Time:  Thursday December 16 2023 01:04:42 EST Ventricular Rate:  75 PR Interval:  141 QRS Duration:  102 QT Interval:  399 QTC Calculation: 446 R Axis:   -12  Text Interpretation: Sinus rhythm Atrial premature complex LVH with secondary repolarization abnormality Confirmed by Nettie, April ([id number]) on 12/16/2023 3:01:29 AM  Radiology: CT Angio Chest/Abd/Pel for Dissection W and/or W/WO Result Date: 12/16/2023 EXAM: CTA CHEST, ABDOMEN AND PELVIS WITHOUT AND WITH CONTRAST 12/16/2023 02:27:39 AM TECHNIQUE: CTA of the chest was performed without and with the administration of 100 mL iohexol  (OMNIPAQUE ) 350 mg/mL injection. CTA of the abdomen and pelvis was performed with the administration of 100 mL iohexol  (OMNIPAQUE ) 350 mg/mL injection. Multiplanar reformatted images are provided for review. MIP images are provided for review. Automated exposure control, iterative reconstruction, and/or weight based adjustment of the mA/kV was utilized to reduce the radiation dose to as low as reasonably achievable. COMPARISON: Radiograph 12/15/2023 and comparison 06/21/2023. CLINICAL HISTORY: Chest congestion, cough, sneezing for a week. Acute aortic syndrome suspected. FINDINGS: VASCULATURE: AORTA: Unchanged dilation of the distal aortic arch measuring 39 mm. The ascending aorta is normal in caliber. The distal descending thoracic aorta is aneurysmal measuring 45 x 44 mm, unchanged from prior. There is some associated mixing artifact in the area of the descending thoracic aortic aneurysm. Infrarenal abdominal aortic aneurysm is minimally changed measuring up to 30 mm. Calcified atherosclerotic plaque of the aorta. No occlusion. No dissection. PULMONARY ARTERIES: No pulmonary embolism with the limits of this exam. GREAT VESSELS OF AORTIC ARCH: No acute finding. No dissection. No arterial occlusion or significant stenosis. CELIAC TRUNK: No acute  finding. No occlusion or significant stenosis. SUPERIOR MESENTERIC ARTERY: No acute finding. No occlusion or significant stenosis. INFERIOR MESENTERIC ARTERY: No acute finding. No occlusion or significant stenosis. RENAL ARTERIES: No acute finding. No occlusion or significant stenosis. ILIAC ARTERIES: Unchanged right, no iliac artery aneurysm measuring 18 mm. No occlusion or significant stenosis. CHEST: MEDIASTINUM: No mediastinal lymphadenopathy. The heart and pericardium demonstrate no acute abnormality. LUNGS AND PLEURA: Elevated left hemidiaphragm with left basilar atelectasis. Perihilar ground glass opacities in the right upper lobe are likely infectious / inflammatory. Follow up CT in 3 months is recommended to assess stability / resolution. No evidence of pleural effusion or pneumothorax. THORACIC BONES AND SOFT TISSUES: No acute bone or soft tissue abnormality. ABDOMEN AND PELVIS: LIVER: The liver is unremarkable. GALLBLADDER AND BILE DUCTS: Gallbladder is unremarkable. No biliary ductal dilatation. SPLEEN: The spleen is unremarkable.  PANCREAS: The pancreas is unremarkable. ADRENAL GLANDS: Bilateral adrenal glands demonstrate no acute abnormality. KIDNEYS, URETERS AND BLADDER: No stones in the kidneys or ureters. No hydronephrosis. No perinephric or periureteral stranding. Urinary bladder is unremarkable. GI AND BOWEL: Stomach and duodenal sweep demonstrate no acute abnormality. Normal appendix. There is no bowel obstruction. No abnormal bowel wall thickening or distension. REPRODUCTIVE: Brachytherapy seeds in the prostate. PERITONEUM AND RETROPERITONEUM: No ascites or free air. LYMPH NODES: No lymphadenopathy. ABDOMINAL BONES AND SOFT TISSUES: No acute abnormality of the bones. No acute soft tissue abnormality. IMPRESSION: 1. No evidence of acute aortic syndrome. 2. Perihilar ground glass opacities in the right upper lobe, likely infectious/inflammatory. Follow-up chest CT in 3 months is recommended to  assess stability/resolution. 3. Unchanged dilation of the distal aortic arch measuring 39 mm and aneurysmal distal descending thoracic aorta measuring 45 x 44 mm. 4. Minimally changed infrarenal abdominal aortic aneurysm measuring up to 30 mm. Recommend ultrasound follow-up in 3 years per Society for Vascular Surgery guidelines. Electronically signed by: Norman Gatlin MD 12/16/2023 02:44 AM EST RP Workstation: [id number]   DG Chest 2 View Result Date: 12/15/2023 EXAM: 2 VIEW(S) XRAY OF THE CHEST 12/15/2023 06:45:00 PM COMPARISON: Comparison 06/21/2023. CLINICAL HISTORY: cough, chest congestion FINDINGS: LUNGS AND PLEURA: Elevated left hemidiaphragm is again noted with minimal left basilar subselectasis or scarring. No pleural effusion. No pneumothorax. HEART AND MEDIASTINUM: Stable cardiomediastinal silhouette. BONES AND SOFT TISSUES: No acute osseous abnormality. IMPRESSION: 1. Stable elevated left hemidiaphragm with minimal left basilar subsegmental atelectasis or scarring. Electronically signed by: Lynwood Seip MD 12/15/2023 07:51 PM EST RP Workstation: [id number]     .Critical Care  Performed by: Lang Norleen POUR, PA-C Authorized by: Lang Norleen POUR, PA-C   Critical care provider statement:    Critical care time (minutes):  30   Critical care was necessary to treat or prevent imminent or life-threatening deterioration of the following conditions: hypertensive emergency.   Critical care was time spent personally by me on the following activities:  Development of treatment plan with patient or surrogate, discussions with consultants, evaluation of patient's response to treatment, examination of patient, ordering and review of laboratory studies, ordering and review of radiographic studies, ordering and performing treatments and interventions, pulse oximetry, re-evaluation of patient's condition and review of old charts    Medications Ordered in the ED  hydrALAZINE  (APRESOLINE ) injection 10 mg  (has no administration in time range)  enoxaparin  (LOVENOX ) injection 40 mg (40 mg Subcutaneous Given 12/16/23 0944)  acetaminophen  (TYLENOL ) tablet 650 mg (650 mg Oral Given 12/16/23 1247)    Or  acetaminophen  (TYLENOL ) suppository 650 mg ( Rectal See Alternative 12/16/23 1247)  traZODone  (DESYREL ) tablet 25 mg (has no administration in time range)  magnesium  hydroxide (MILK OF MAGNESIA) suspension 30 mL (has no administration in time range)  ondansetron  (ZOFRAN ) tablet 4 mg (has no administration in time range)    Or  ondansetron  (ZOFRAN ) injection 4 mg (has no administration in time range)  atorvastatin  (LIPITOR) tablet 40 mg (40 mg Oral Given 12/16/23 0941)  spironolactone  (ALDACTONE ) tablet 25 mg (25 mg Oral Given 12/16/23 0941)  tamsulosin  (FLOMAX ) capsule 0.4 mg (0.4 mg Oral Given 12/16/23 0941)  thiamine  (VITAMIN B1) tablet 100 mg (100 mg Oral Given 12/16/23 0940)  irbesartan  (AVAPRO ) tablet 300 mg (300 mg Oral Given 12/16/23 0941)  amLODipine  (NORVASC ) tablet 10 mg (10 mg Oral Given 12/16/23 0940)  hydrochlorothiazide  (HYDRODIURIL ) tablet 25 mg (25 mg Oral Given 12/16/23 0941)  cefTRIAXone  (ROCEPHIN ) 1 g  in sodium chloride  0.9 % 100 mL IVPB (1 g Intravenous New Bag/Given 12/16/23 1715)  azithromycin  (ZITHROMAX ) tablet 500 mg (500 mg Oral Given 12/16/23 0941)  hydrALAZINE  (APRESOLINE ) injection 5 mg (5 mg Intravenous Given 12/16/23 0141)  iohexol  (OMNIPAQUE ) 350 MG/ML injection 100 mL (100 mLs Intravenous Contrast Given 12/16/23 0228)  aspirin  chewable tablet 324 mg (324 mg Oral Given 12/16/23 0400)  cefTRIAXone  (ROCEPHIN ) 1 g in sodium chloride  0.9 % 100 mL IVPB (0 g Intravenous Stopped 12/16/23 0448)  hydrALAZINE  (APRESOLINE ) injection 5 mg (5 mg Intravenous Given 12/16/23 0406)                                    Medical Decision Making Amount and/or Complexity of Data Reviewed Labs: ordered. Radiology: ordered.  Risk OTC drugs. Prescription drug management. Decision regarding  hospitalization.   Initial Impression and Ddx 63 year old well-appearing male presenting for chest pain and URI symptoms.  Exam is unremarkable but notably hypertensive.  DDx includes aortic dissection, ACS, PE, pneumonia, sepsis, pneumothorax, CHF exacerbation, hypertensive emergency, other. Patient PMH that increases complexity of ED encounter:  AAA, hypertension, hyperlipidemia  Interpretation of Diagnostics - I independent reviewed and interpreted the labs as followed: troponin 51  - I independently visualized the following imaging with scope of interpretation limited to determining acute life threatening conditions related to emergency care: CT angio chest/ab/pelvis, which revealed no dissection and findings suggestive of pneumonia  -I personally reviewed and interpreted EKG which revealed sinus rhythm without evidence of ischemia  Patient Reassessment and Ultimate Disposition/Management Workup overall reassuring but still persistently hypertensive here, also elevation of troponin and persistence of his chest pain is concerning for hypertensive emergency.  He has responded well to IV hydralazine .  Also started him on treatment for CAP.  Remained stable on room air.  Admitted to hospital service with Dr. Lawence.  Patient management required discussion with the following services or consulting groups:  Hospitalist Service  Complexity of Problems Addressed Acute complicated illness or Injury  Additional Data Reviewed and Analyzed Further history obtained from: Past medical history and medications listed in the EMR and Prior ED visit notes  Patient Encounter Risk Assessment Consideration of hospitalization      Final diagnoses:  Elevated troponin  Pneumonia due to infectious organism, unspecified laterality, unspecified part of lung    ED Discharge Orders     None          Lang Norleen POUR, PA-C 12/16/23 2212    Palumbo, April, MD 12/20/23 2324

## 2023-12-16 NOTE — H&P (Addendum)
 History and Physical    Patient: Benjamin Hines FMW:[id number] DOB: November 15, 1960 DOA: 12/15/2023 DOS: the patient was seen and examined on 12/16/2023 PCP: Pcp, No  Patient coming from: Home  Chief Complaint:  Chief Complaint  Patient presents with   flu sx   HPI: Benjamin Hines is a 63 y.o. male with medical history significant of HFpEF, HTN, HLD, alcohol abuse, tobacco use, and OSA p/w HTN emergency iso NSTEMI type 2 c/b CAP.  The patient reported experiencing a severe cough that had persisted for about a week, becoming progressively worse. The cough was accompanied by soreness in the back, and it intensified when the patient lay down, leading to episodes of uncontrollable coughing. The patient was concerned about possibly having the flu or COVID-19, so he presented to the ED. Additionally, the patient also mentioned having issues with his new blood pressure medication, which caused drowsiness. The patient works as a curator, which requires alertness and physical activity, adding difficulty due to the medication side effects, namely lethargy and drowsiness.   In the ED, pt hypertensive  (SBP 200s), tachycardic, and tachypneic w/o hypoxia. Labs notable for troponin 51-52. Nasal swab neg for influenza A/B, RSV, and COVID-19. CTA neg for PE, but did show RUL consolidation c/f infection ands stable AAA. EDP started home anti-hypertensives, CAP abx (IV CTX/azithromycin) and requested medicine admission.   Review of Systems: As mentioned in the history of present illness. All other systems reviewed and are negative. Past Medical History:  Diagnosis Date   AAA (abdominal aortic aneurysm) without rupture 12/27/2017   followed by vascular-- dr c. gretta,  last duplex in epic 07-23-2020 4.6cm   Arthritis    Chronic combined systolic and diastolic CHF (congestive heart failure) (HCC) 12/2017   Hyperlipidemia    Hypertension    followed by pcp   Nocturia    OSA (obstructive sleep apnea)     pulmonology --- dr shellia----  (11-21-2020  pt stated has not used cpap in 6 months , intolerate)   Past Surgical History:  Procedure Laterality Date   COLONOSCOPY  07/26/2020   by dr stark   CYSTOSCOPY  11/26/2020   Procedure: PHYLLIS SIDE;  Surgeon: Elisabeth Valli BIRCH, MD;  Location: [facility];  Service: Urology;;   FOOT SURGERY Left 2002   fracture  (pt stated no hardware)   RADIOACTIVE SEED IMPLANT N/A 11/26/2020   Procedure: RADIOACTIVE SEED IMPLANT/BRACHYTHERAPY IMPLANT;  Surgeon: Elisabeth Valli BIRCH, MD;  Location: [facility];  Service: Urology;  Laterality: N/A;   SPACE OAR INSTILLATION N/A 11/26/2020   Procedure: SPACE OAR INSTILLATION;  Surgeon: Elisabeth Valli BIRCH, MD;  Location: [facility];  Service: Urology;  Laterality: N/A;   Social History:  reports that he has been smoking cigarettes and cigars. He has a 30 pack-year smoking history. He has been exposed to tobacco smoke. He has never used smokeless tobacco. He reports that he does not currently use alcohol after a past usage of about 21.0 standard drinks of alcohol per week. He reports that he does not use drugs.  No Known Allergies  Family History  Problem Relation Age of Onset   Dementia Mother    Hypertension Mother    Hypertension Father    CAD Father    Colon cancer Neg Hx    Colon polyps Neg Hx    Esophageal cancer Neg Hx    Rectal cancer Neg Hx    Stomach cancer Neg Hx  Prior to Admission medications   Medication Sig Start Date End Date Taking? Authorizing Provider  cetirizine  (ZYRTEC ) 10 MG tablet Take 10 mg by mouth at bedtime as needed for allergies. 04/25/20  Yes [provider]  fluticasone  (FLONASE ) 50 MCG/ACT nasal spray Place 1 spray into both nostrils daily as needed. 03/06/20  Yes [provider]  Olmesartan -amLODIPine -HCTZ 40-10-25 MG TABS Take 1 tablet by mouth daily. 08/03/23  Yes Raford Riggs, MD  spironolactone   (ALDACTONE ) 25 MG tablet Take 1 tablet (25 mg total) by mouth daily. 08/03/23  Yes Raford Riggs, MD  atorvastatin  (LIPITOR) 40 MG tablet Take 1 tablet (40 mg total) by mouth daily. Patient not taking: Reported on 12/16/2023 09/06/20   Micheal Wolm ORN, MD  tamsulosin  (FLOMAX ) 0.4 MG CAPS capsule Take 0.4 mg by mouth daily. Patient not taking: Reported on 12/16/2023    [provider]  thiamine  (VITAMIN B-1) 100 MG tablet Take 100 mg by mouth daily. Patient not taking: Reported on 12/16/2023    [provider]    Physical Exam: Vitals:   12/16/23 0605 12/16/23 0715 12/16/23 0745 12/16/23 0749  BP: (!) 181/121 (!) 188/124 (!) 166/117   Pulse: 95 88 81   Resp: (!) 21 (!) 23 18   Temp:    97.8 F (36.6 C)  TempSrc:      SpO2: 100% 100% 95%   Weight:      Height:       General: Alert, oriented x3, resting comfortably in no acute distress Respiratory: Lungs clear to auscultation bilaterally with normal respiratory effort; no w/r/r Cardiovascular: Regular rate and rhythm w/o m/r/g   Data Reviewed:  Lab Results  Component Value Date   WBC 9.3 12/15/2023   HGB 15.7 12/15/2023   HCT 48.2 12/15/2023   MCV 92.3 12/15/2023   PLT 192 12/15/2023   Lab Results  Component Value Date   GLUCOSE 110 (H) 12/16/2023   CALCIUM  8.4 (L) 12/16/2023   NA 138 12/16/2023   K 3.6 12/16/2023   CO2 24 12/16/2023   CL 104 12/16/2023   BUN 11 12/16/2023   CREATININE 0.88 12/16/2023   Lab Results  Component Value Date   ALT 26 11/22/2020   AST 29 11/22/2020   ALKPHOS 78 11/22/2020   BILITOT 0.8 11/22/2020   Lab Results  Component Value Date   INR 1.0 11/22/2020   Radiology: CT Angio Chest/Abd/Pel for Dissection W and/or W/WO Result Date: 12/16/2023 EXAM: CTA CHEST, ABDOMEN AND PELVIS WITHOUT AND WITH CONTRAST 12/16/2023 02:27:39 AM TECHNIQUE: CTA of the chest was performed without and with the administration of 100 mL iohexol  (OMNIPAQUE ) 350 mg/mL injection. CTA of the  abdomen and pelvis was performed with the administration of 100 mL iohexol  (OMNIPAQUE ) 350 mg/mL injection. Multiplanar reformatted images are provided for review. MIP images are provided for review. Automated exposure control, iterative reconstruction, and/or weight based adjustment of the mA/kV was utilized to reduce the radiation dose to as low as reasonably achievable. COMPARISON: Radiograph 12/15/2023 and comparison 06/21/2023. CLINICAL HISTORY: Chest congestion, cough, sneezing for a week. Acute aortic syndrome suspected. FINDINGS: VASCULATURE: AORTA: Unchanged dilation of the distal aortic arch measuring 39 mm. The ascending aorta is normal in caliber. The distal descending thoracic aorta is aneurysmal measuring 45 x 44 mm, unchanged from prior. There is some associated mixing artifact in the area of the descending thoracic aortic aneurysm. Infrarenal abdominal aortic aneurysm is minimally changed measuring up to 30 mm. Calcified atherosclerotic plaque of the aorta. No  occlusion. No dissection. PULMONARY ARTERIES: No pulmonary embolism with the limits of this exam. GREAT VESSELS OF AORTIC ARCH: No acute finding. No dissection. No arterial occlusion or significant stenosis. CELIAC TRUNK: No acute finding. No occlusion or significant stenosis. SUPERIOR MESENTERIC ARTERY: No acute finding. No occlusion or significant stenosis. INFERIOR MESENTERIC ARTERY: No acute finding. No occlusion or significant stenosis. RENAL ARTERIES: No acute finding. No occlusion or significant stenosis. ILIAC ARTERIES: Unchanged right, no iliac artery aneurysm measuring 18 mm. No occlusion or significant stenosis. CHEST: MEDIASTINUM: No mediastinal lymphadenopathy. The heart and pericardium demonstrate no acute abnormality. LUNGS AND PLEURA: Elevated left hemidiaphragm with left basilar atelectasis. Perihilar ground glass opacities in the right upper lobe are likely infectious / inflammatory. Follow up CT in 3 months is recommended to  assess stability / resolution. No evidence of pleural effusion or pneumothorax. THORACIC BONES AND SOFT TISSUES: No acute bone or soft tissue abnormality. ABDOMEN AND PELVIS: LIVER: The liver is unremarkable. GALLBLADDER AND BILE DUCTS: Gallbladder is unremarkable. No biliary ductal dilatation. SPLEEN: The spleen is unremarkable. PANCREAS: The pancreas is unremarkable. ADRENAL GLANDS: Bilateral adrenal glands demonstrate no acute abnormality. KIDNEYS, URETERS AND BLADDER: No stones in the kidneys or ureters. No hydronephrosis. No perinephric or periureteral stranding. Urinary bladder is unremarkable. GI AND BOWEL: Stomach and duodenal sweep demonstrate no acute abnormality. Normal appendix. There is no bowel obstruction. No abnormal bowel wall thickening or distension. REPRODUCTIVE: Brachytherapy seeds in the prostate. PERITONEUM AND RETROPERITONEUM: No ascites or free air. LYMPH NODES: No lymphadenopathy. ABDOMINAL BONES AND SOFT TISSUES: No acute abnormality of the bones. No acute soft tissue abnormality. IMPRESSION: 1. No evidence of acute aortic syndrome. 2. Perihilar ground glass opacities in the right upper lobe, likely infectious/inflammatory. Follow-up chest CT in 3 months is recommended to assess stability/resolution. 3. Unchanged dilation of the distal aortic arch measuring 39 mm and aneurysmal distal descending thoracic aorta measuring 45 x 44 mm. 4. Minimally changed infrarenal abdominal aortic aneurysm measuring up to 30 mm. Recommend ultrasound follow-up in 3 years per Society for Vascular Surgery guidelines. Electronically signed by: Norman Gatlin MD 12/16/2023 02:44 AM EST RP Workstation: [id number]   DG Chest 2 View Result Date: 12/15/2023 EXAM: 2 VIEW(S) XRAY OF THE CHEST 12/15/2023 06:45:00 PM COMPARISON: Comparison 06/21/2023. CLINICAL HISTORY: cough, chest congestion FINDINGS: LUNGS AND PLEURA: Elevated left hemidiaphragm is again noted with minimal left basilar subselectasis or scarring. No  pleural effusion. No pneumothorax. HEART AND MEDIASTINUM: Stable cardiomediastinal silhouette. BONES AND SOFT TISSUES: No acute osseous abnormality. IMPRESSION: 1. Stable elevated left hemidiaphragm with minimal left basilar subsegmental atelectasis or scarring. Electronically signed by: Lynwood Seip MD 12/15/2023 07:51 PM EST RP Workstation: [id number]    Assessment and Plan: 14M h/o HFpEF, HTN, HLD, alcohol abuse, tobacco use, and OSA p/w HTN emergency iso NSTEMI type 2 c/b CAP.  HTN emergency  NSTEMI, type 2 Elevated troponin Demand ischemia Elevated BP from missed BP meds, stable troponin -PTA olmesartan -amlodipine -hydrochlorothiazide  40-10-25mg  daily -PTA spironolactone  25mg  daily -Consider Coreg if needed for further BP titration -F/u BNP  CAP -IV CTX 1g daily to complete 5 day CAP course -PO azithromycin 500mg  daily to complete 3 day CAP course -Duonebs prn -Wean O2 as tolerated -Ambulatory pulse ox prior to d/c  BPH -PTA Flomax   Advance Care Planning:   Code Status: Full Code   Consults: N/A  Family Communication: Brother  Severity of Illness: The appropriate patient status for this patient is INPATIENT. Inpatient status is judged to be reasonable and necessary in  order to provide the required intensity of service to ensure the patient's safety. The patient's presenting symptoms, physical exam findings, and initial radiographic and laboratory data in the context of their chronic comorbidities is felt to place them at high risk for further clinical deterioration. Furthermore, it is not anticipated that the patient will be medically stable for discharge from the hospital within 2 midnights of admission.   * I certify that at the point of admission it is my clinical judgment that the patient will require inpatient hospital care spanning beyond 2 midnights from the point of admission due to high intensity of service, high risk for further deterioration and high frequency of  surveillance required.*   ------- I spent 55 minutes reviewing previous notes, at the bedside counseling/discussing the treatment plan, and performing clinical documentation.  Author: Marsha Ada, MD 12/16/2023 7:55 AM  For on call review [email or web address].

## 2023-12-16 NOTE — ED Notes (Signed)
 CCMD called by this RN

## 2023-12-16 NOTE — Plan of Care (Signed)
  Problem: Clinical Measurements: Goal: Diagnostic test results will improve Outcome: Progressing   Problem: Clinical Measurements: Goal: Signs and symptoms of infection will decrease Outcome: Progressing   Problem: Education: Goal: Knowledge of General Education information will improve Description: Including pain rating scale, medication(s)/side effects and non-pharmacologic comfort measures Outcome: Progressing   Problem: Clinical Measurements: Goal: Diagnostic test results will improve Outcome: Progressing   Problem: Clinical Measurements: Goal: Will remain free from infection Outcome: Progressing   Problem: Skin Integrity: Goal: Risk for impaired skin integrity will decrease Outcome: Progressing

## 2023-12-17 DIAGNOSIS — I160 Hypertensive urgency: Secondary | ICD-10-CM | POA: Diagnosis not present

## 2023-12-17 LAB — BASIC METABOLIC PANEL WITH GFR
Anion gap: 13 (ref 5–15)
BUN: 13 mg/dL (ref 8–23)
CO2: 24 mmol/L (ref 22–32)
Calcium: 8.7 mg/dL — ABNORMAL LOW (ref 8.9–10.3)
Chloride: 102 mmol/L (ref 98–111)
Creatinine, Ser: 0.97 mg/dL (ref 0.61–1.24)
GFR, Estimated: >60 mL/min (ref >60)
Glucose, Bld: 101 mg/dL — ABNORMAL HIGH (ref 70–99)
Potassium: 3.7 mmol/L (ref 3.5–5.1)
Sodium: 139 mmol/L (ref 135–145)

## 2023-12-17 LAB — MAGNESIUM: Magnesium: 1.7 mg/dL (ref 1.7–2.4)

## 2023-12-17 MED ORDER — SODIUM CHLORIDE 0.9 % IV SOLN
1.0000 g | Freq: Once | INTRAVENOUS | Status: AC
  Administered 2023-12-17: 1 g via INTRAVENOUS
  Filled 2023-12-17: qty 10

## 2023-12-17 MED ORDER — PNEUMOCOCCAL 20-VAL CONJ VACC 0.5 ML IM SUSY
0.5000 mL | PREFILLED_SYRINGE | INTRAMUSCULAR | Status: AC
  Administered 2023-12-17: 0.5 mL via INTRAMUSCULAR
  Filled 2023-12-17 (×2): qty 0.5

## 2023-12-17 MED ORDER — AZITHROMYCIN 250 MG PO TABS
500.0000 mg | ORAL_TABLET | Freq: Once | ORAL | Status: AC
  Administered 2023-12-17: 500 mg via ORAL
  Filled 2023-12-17: qty 2

## 2023-12-17 MED ORDER — GUAIFENESIN ER 600 MG PO TB12: 1200.0000 mg | ORAL_TABLET | Freq: Two times a day (BID) | ORAL | 0 refills | Status: AC

## 2023-12-17 MED ORDER — HYDRALAZINE HCL 10 MG PO TABS
10.0000 mg | ORAL_TABLET | Freq: Once | ORAL | Status: AC
  Administered 2023-12-17: 10 mg via ORAL
  Filled 2023-12-17: qty 1

## 2023-12-17 MED ORDER — INFLUENZA VAC SPLIT HIGH-DOSE 0.5 ML IM SUSY
0.5000 mL | PREFILLED_SYRINGE | INTRAMUSCULAR | Status: AC
  Administered 2023-12-17: 0.5 mL via INTRAMUSCULAR
  Filled 2023-12-17: qty 0.5

## 2023-12-17 MED ORDER — AMOXICILLIN-POT CLAVULANATE 875-125 MG PO TABS: 1.0000 | ORAL_TABLET | Freq: Two times a day (BID) | ORAL | 0 refills | Status: AC

## 2023-12-17 MED ORDER — CETIRIZINE HCL 10 MG PO TABS: 10.0000 mg | ORAL_TABLET | Freq: Every day | ORAL | 0 refills | Status: AC

## 2023-12-17 NOTE — Plan of Care (Signed)
  Problem: Respiratory: Goal: Ability to maintain adequate ventilation will improve Outcome: Progressing   Problem: Clinical Measurements: Goal: Respiratory complications will improve Outcome: Progressing Goal: Cardiovascular complication will be avoided Outcome: Progressing   Problem: Activity: Goal: Risk for activity intolerance will decrease Outcome: Progressing   Problem: Nutrition: Goal: Adequate nutrition will be maintained Outcome: Progressing   Problem: Pain Managment: Goal: General experience of comfort will improve and/or be controlled Outcome: Progressing   Problem: Safety: Goal: Ability to remain free from injury will improve Outcome: Progressing

## 2023-12-17 NOTE — Progress Notes (Signed)
 MD notified of elevated blood pressures before discharging patient. MD advise okay to sent pt home. Will complete discharge.

## 2023-12-17 NOTE — TOC CM/SW Note (Signed)
 Transition of Care [facility]) - Inpatient Brief Assessment   Patient Details  Name: Benjamin Hines MRN: [id number] Date of Birth: 04/11/1960  [facility] CM/SW Contact:    Sudie Erminio Deems, RN Phone Number: 12/17/2023, 10:37 AM   Clinical Narrative: Patient presented for hypertensive urgency. PTA patient was from home. Patient is in need of a PCP and he wants the Hospital to arrange a PCP appointment. CMA to assist and information will be added to the AVS. Patient drove himself to the hospital and will drive home in  private vehicle. No further home needs identified.    Transition of Care Asessment: Insurance and Status: Insurance coverage has been reviewed Patient has primary care physician: No (arranging PCP before d/c.) Home environment has been reviewed: reviewed Prior level of function:: independent Prior/Current Home Services: No current home services Social Drivers of Health Review: SDOH reviewed no interventions necessary Readmission risk has been reviewed: Yes Transition of care needs: no transition of care needs at this time

## 2023-12-17 NOTE — Plan of Care (Signed)
  Problem: Clinical Measurements: Goal: Diagnostic test results will improve Outcome: Progressing Goal: Signs and symptoms of infection will decrease Outcome: Progressing   Problem: Education: Goal: Knowledge of General Education information will improve Description: Including pain rating scale, medication(s)/side effects and non-pharmacologic comfort measures Outcome: Progressing   Problem: Health Behavior/Discharge Planning: Goal: Ability to manage health-related needs will improve Outcome: Progressing   Problem: Clinical Measurements: Goal: Diagnostic test results will improve Outcome: Progressing Goal: Respiratory complications will improve Outcome: Progressing Goal: Cardiovascular complication will be avoided Outcome: Progressing   Problem: Nutrition: Goal: Adequate nutrition will be maintained Outcome: Progressing   Problem: Elimination: Goal: Will not experience complications related to bowel motility Outcome: Progressing Goal: Will not experience complications related to urinary retention Outcome: Progressing   Problem: Pain Managment: Goal: General experience of comfort will improve and/or be controlled Outcome: Progressing   Problem: Activity: Goal: Ability to tolerate increased activity will improve Outcome: Progressing   Problem: Clinical Measurements: Goal: Ability to maintain a body temperature in the normal range will improve Outcome: Progressing

## 2023-12-17 NOTE — Progress Notes (Signed)
 Patient had 8 runs on Vtach on the monitor. Assessed patient on the bedside. Patient denies any palpitations, chest pain, lightheadedness. Notified Dr Franky and ordered for labs.

## 2023-12-17 NOTE — Discharge Summary (Signed)
 Physician Discharge Summary  Benjamin Hines FMW:[id number] DOB: 16-Jun-1960 DOA: 12/15/2023  PCP: Pcp, No  Admit date: 12/15/2023 Discharge date: 12/17/2023  Admitted From: Home Disposition: Home  Recommendations for Outpatient Follow-up:  Follow up with PCP in 1-2 weeks Please obtain BMP/CBC in one week   Discharge Condition: Stable CODE STATUS: Full code Diet recommendation: Low-salt diet  Discharge summary: 63 year old with history of heart failure with preserved ejection fraction, hypertension, hyperlipidemia, ongoing smoker, sleep apnea untreated who was suffering from cough, nasal congestion and flulike symptoms for about 1 week now.  He had dry cough, soreness on his back.  He did not take any antibiotics as outpatient.  He was also not taking his blood pressure medication for last 2 days.  Patient does not wear CPAP because he could not tolerate it. In the emergency room patient was hypertensive with blood pressure 200s, tachycardic, tachypneic without hypoxemia.  Troponin 51-52.  Flu, RSV and COVID-negative.  CT angiogram of the chest negative for PE, right upper lobe peribronchial infiltrate.  WBC count was normal.   Hypertensive emergency, patient noncompliant to home medications.  Patient is on multiple home medications, combination of amlodipine , hydrochlorothiazide , irbesartan , spironolactone .  Patient was put back on his home medications with him optimally controlled blood pressure.  He was advised to continue these medications, advised low-salt diet and compliance.  Patient also does not have a PCP.  He needs a PCP follow-up.  He does have adequate medications with him.  Troponin, type II myocardial injury: Does not have any evidence of ischemia.  EKG nonischemic.  This was likely related to high blood pressure.  Echocardiogram 08/2023 with EF of 50%.  Currently no evidence of fluid overload. Back on hydrochlorothiazide  and aspirin  lactone.  Bronchitis/right upper lobe ground  glass opacity: WBC count is normal.  Recently suffered from URI symptoms.  Remains on room air.  Mobilizing around.  Afebrile.  Blood cultures were not drawn on admission.  Does not have adequate sputum production for culture.  Flu, RSV and COVID-negative. Received Rocephin  and azithromycin  x 2 days.  Will treat with Augmentin  for 5 more days. Claritin , Flonase , Mucinex .  Discussed smoking cessation.  Stable to discharge home with outpatient follow-up.  Discharge Diagnoses:  Principal Problem:   Hypertensive urgency    Discharge Instructions  Discharge Instructions     Call MD for:  difficulty breathing, headache or visual disturbances   Complete by: As directed    Diet - low sodium heart healthy   Complete by: As directed    Increase activity slowly   Complete by: As directed       Allergies as of 12/17/2023   No Known Allergies      Medication List     TAKE these medications    amoxicillin -clavulanate 875-125 MG tablet Commonly known as: AUGMENTIN  Take 1 tablet by mouth 2 (two) times daily for 5 days.   atorvastatin  40 MG tablet Commonly known as: LIPITOR Take 1 tablet (40 mg total) by mouth daily.   cetirizine  10 MG tablet Commonly known as: ZYRTEC  Take 1 tablet (10 mg total) by mouth daily for 14 doses. What changed:  when to take this reasons to take this   fluticasone  50 MCG/ACT nasal spray Commonly known as: FLONASE  Place 1 spray into both nostrils daily as needed.   guaiFENesin  600 MG 12 hr tablet Commonly known as: Mucinex  Take 2 tablets (1,200 mg total) by mouth 2 (two) times daily for 14 days.   Olmesartan -amLODIPine -HCTZ 40-10-25  MG Tabs Take 1 tablet by mouth daily.   spironolactone  25 MG tablet Commonly known as: Aldactone  Take 1 tablet (25 mg total) by mouth daily.   tamsulosin  0.4 MG Caps capsule Commonly known as: FLOMAX  Take 0.4 mg by mouth daily.   thiamine  100 MG tablet Commonly known as: Vitamin B-1 Take 100 mg by mouth  daily.        Follow-up Information     Patti Mliss LABOR, FNP Follow up.   Specialty: Family Medicine Why: TIME : 10:20 AM    PLEASE ARRIVE AT 10:00 AM DATE : DECEMBER 12 , 2025 FRIDAY  PLEASE BRING ALL CURRENT MEDICATION,ID and INS CARD, CO-PAY Contact information: [address] [place] [address] [phone]                No Known Allergies  Consultations: None   Procedures/Studies: CT Angio Chest/Abd/Pel for Dissection W and/or W/WO Result Date: 12/16/2023 EXAM: CTA CHEST, ABDOMEN AND PELVIS WITHOUT AND WITH CONTRAST 12/16/2023 02:27:39 AM TECHNIQUE: CTA of the chest was performed without and with the administration of 100 mL iohexol  (OMNIPAQUE ) 350 mg/mL injection. CTA of the abdomen and pelvis was performed with the administration of 100 mL iohexol  (OMNIPAQUE ) 350 mg/mL injection. Multiplanar reformatted images are provided for review. MIP images are provided for review. Automated exposure control, iterative reconstruction, and/or weight based adjustment of the mA/kV was utilized to reduce the radiation dose to as low as reasonably achievable. COMPARISON: Radiograph 12/15/2023 and comparison 06/21/2023. CLINICAL HISTORY: Chest congestion, cough, sneezing for a week. Acute aortic syndrome suspected. FINDINGS: VASCULATURE: AORTA: Unchanged dilation of the distal aortic arch measuring 39 mm. The ascending aorta is normal in caliber. The distal descending thoracic aorta is aneurysmal measuring 45 x 44 mm, unchanged from prior. There is some associated mixing artifact in the area of the descending thoracic aortic aneurysm. Infrarenal abdominal aortic aneurysm is minimally changed measuring up to 30 mm. Calcified atherosclerotic plaque of the aorta. No occlusion. No dissection. PULMONARY ARTERIES: No pulmonary embolism with the limits of this exam. GREAT VESSELS OF AORTIC ARCH: No acute finding. No dissection. No arterial occlusion or significant stenosis. CELIAC  TRUNK: No acute finding. No occlusion or significant stenosis. SUPERIOR MESENTERIC ARTERY: No acute finding. No occlusion or significant stenosis. INFERIOR MESENTERIC ARTERY: No acute finding. No occlusion or significant stenosis. RENAL ARTERIES: No acute finding. No occlusion or significant stenosis. ILIAC ARTERIES: Unchanged right, no iliac artery aneurysm measuring 18 mm. No occlusion or significant stenosis. CHEST: MEDIASTINUM: No mediastinal lymphadenopathy. The heart and pericardium demonstrate no acute abnormality. LUNGS AND PLEURA: Elevated left hemidiaphragm with left basilar atelectasis. Perihilar ground glass opacities in the right upper lobe are likely infectious / inflammatory. Follow up CT in 3 months is recommended to assess stability / resolution. No evidence of pleural effusion or pneumothorax. THORACIC BONES AND SOFT TISSUES: No acute bone or soft tissue abnormality. ABDOMEN AND PELVIS: LIVER: The liver is unremarkable. GALLBLADDER AND BILE DUCTS: Gallbladder is unremarkable. No biliary ductal dilatation. SPLEEN: The spleen is unremarkable. PANCREAS: The pancreas is unremarkable. ADRENAL GLANDS: Bilateral adrenal glands demonstrate no acute abnormality. KIDNEYS, URETERS AND BLADDER: No stones in the kidneys or ureters. No hydronephrosis. No perinephric or periureteral stranding. Urinary bladder is unremarkable. GI AND BOWEL: Stomach and duodenal sweep demonstrate no acute abnormality. Normal appendix. There is no bowel obstruction. No abnormal bowel wall thickening or distension. REPRODUCTIVE: Brachytherapy seeds in the prostate. PERITONEUM AND RETROPERITONEUM: No ascites or free air. LYMPH NODES: No  lymphadenopathy. ABDOMINAL BONES AND SOFT TISSUES: No acute abnormality of the bones. No acute soft tissue abnormality. IMPRESSION: 1. No evidence of acute aortic syndrome. 2. Perihilar ground glass opacities in the right upper lobe, likely infectious/inflammatory. Follow-up chest CT in 3 months is  recommended to assess stability/resolution. 3. Unchanged dilation of the distal aortic arch measuring 39 mm and aneurysmal distal descending thoracic aorta measuring 45 x 44 mm. 4. Minimally changed infrarenal abdominal aortic aneurysm measuring up to 30 mm. Recommend ultrasound follow-up in 3 years per Society for Vascular Surgery guidelines. Electronically signed by: Norman Gatlin MD 12/16/2023 02:44 AM EST RP Workstation: [id number]   DG Chest 2 View Result Date: 12/15/2023 EXAM: 2 VIEW(S) XRAY OF THE CHEST 12/15/2023 06:45:00 PM COMPARISON: Comparison 06/21/2023. CLINICAL HISTORY: cough, chest congestion FINDINGS: LUNGS AND PLEURA: Elevated left hemidiaphragm is again noted with minimal left basilar subselectasis or scarring. No pleural effusion. No pneumothorax. HEART AND MEDIASTINUM: Stable cardiomediastinal silhouette. BONES AND SOFT TISSUES: No acute osseous abnormality. IMPRESSION: 1. Stable elevated left hemidiaphragm with minimal left basilar subsegmental atelectasis or scarring. Electronically signed by: Lynwood Seip MD 12/15/2023 07:51 PM EST RP Workstation: [id number]   (Echo, Carotid, EGD, Colonoscopy, ERCP)    Subjective: Patient seen and examined.  He tells me that he has nasal secretions and sometimes feels postnasal drip and wakes up coughing due to the fluid in his throat.  Afebrile.  Walking around in the hallway. Early morning blood pressures were 191/134.  Patient was given his morning medications with blood pressure 169/125.  Patient was not taking medicine at home, so it will likely be overtreatment if we add any blood pressure medications on top of his medication.  He has agreed to take his medicine and follow-up closely.   Discharge Exam: Vitals:   12/17/23 1042 12/17/23 1134  BP: (!) 191/134 (!) 169/125  Pulse:  94  Resp:  17  Temp:  97.8 F (36.6 C)  SpO2:     Vitals:   12/17/23 1004 12/17/23 1034 12/17/23 1042 12/17/23 1134  BP: (!) 156/113 (!) 172/126 (!)  191/134 (!) 169/125  Pulse:    94  Resp:    17  Temp:    97.8 F (36.6 C)  TempSrc:    Oral  SpO2:      Weight:      Height:        General: Pt is alert, awake, not in acute distress Obvious nasal congestion. Oropharynx is clear. Cardiovascular: RRR, S1/S2 +, no rubs, no gallops Respiratory: CTA bilaterally, no wheezing, no rhonchi, no added sounds.  On room air.  Walking in the hallway. Abdominal: Soft, NT, ND, bowel sounds + Extremities: no edema, no cyanosis    The results of significant diagnostics from this hospitalization (including imaging, microbiology, ancillary and laboratory) are listed below for reference.     Microbiology: Recent Results (from the past 240 hours)  Resp panel by RT-PCR (RSV, Flu A&B, Covid) Anterior Nasal Swab     Status: None   Collection Time: 12/15/23  6:02 PM   Specimen: Anterior Nasal Swab  Result Value Ref Range Status   SARS Coronavirus 2 by RT PCR NEGATIVE NEGATIVE Final   Influenza A by PCR NEGATIVE NEGATIVE Final   Influenza B by PCR NEGATIVE NEGATIVE Final    Comment: (NOTE) The Xpert Xpress SARS-CoV-2/FLU/RSV plus assay is intended as an aid in the diagnosis of influenza from Nasopharyngeal swab specimens and should not be used as a sole basis for treatment. Nasal  washings and aspirates are unacceptable for Xpert Xpress SARS-CoV-2/FLU/RSV testing.  Fact Sheet for Patients: bloggercourse.com  Fact Sheet for Healthcare Providers: seriousbroker.it  This test is not yet approved or cleared by the [place]  FDA and has been authorized for detection and/or diagnosis of SARS-CoV-2 by FDA under an Emergency Use Authorization (EUA). This EUA will remain in effect (meaning this test can be used) for the duration of the COVID-19 declaration under Section 564(b)(1) of the Act, 21 U.S.C. section 360bbb-3(b)(1), unless the authorization is terminated or revoked.     Resp Syncytial  Virus by PCR NEGATIVE NEGATIVE Final    Comment: (NOTE) Fact Sheet for Patients: bloggercourse.com  Fact Sheet for Healthcare Providers: seriousbroker.it  This test is not yet approved or cleared by the [place]  FDA and has been authorized for detection and/or diagnosis of SARS-CoV-2 by FDA under an Emergency Use Authorization (EUA). This EUA will remain in effect (meaning this test can be used) for the duration of the COVID-19 declaration under Section 564(b)(1) of the Act, 21 U.S.C. section 360bbb-3(b)(1), unless the authorization is terminated or revoked.  Performed at [facility], [address] [place], [place] [address]      Labs: BNP (last 3 results) Recent Labs    12/16/23 1607  BNP 205.6*   Basic Metabolic Panel: Recent Labs  Lab 12/15/23 1807 12/16/23 0409 12/17/23 0402  NA 140 138 139  K 3.7 3.6 3.7  CL 107 104 102  CO2 22 24 24   GLUCOSE 109* 110* 101*  BUN 11 11 13   CREATININE 0.88 0.88 0.97  CALCIUM  9.0 8.4* 8.7*  MG  --   --  1.7   Liver Function Tests: No results for input(s): AST, ALT, ALKPHOS, BILITOT, PROT, ALBUMIN in the last 168 hours. No results for input(s): LIPASE, AMYLASE in the last 168 hours. No results for input(s): AMMONIA in the last 168 hours. CBC: Recent Labs  Lab 12/15/23 1807 12/16/23 0840  WBC 9.3 9.4  HGB 15.7 15.7  HCT 48.2 47.1  MCV 92.3 90.9  PLT 192 180   Cardiac Enzymes: No results for input(s): CKTOTAL, CKMB, CKMBINDEX, TROPONINI in the last 168 hours. BNP: Invalid input(s): POCBNP CBG: No results for input(s): GLUCAP in the last 168 hours. D-Dimer No results for input(s): DDIMER in the last 72 hours. Hgb A1c No results for input(s): HGBA1C in the last 72 hours. Lipid Profile No results for input(s): CHOL, HDL, LDLCALC, TRIG, CHOLHDL, LDLDIRECT in the last 72 hours. Thyroid  function studies No  results for input(s): TSH, T4TOTAL, T3FREE, THYROIDAB in the last 72 hours.  Invalid input(s): FREET3 Anemia work up No results for input(s): VITAMINB12, FOLATE, FERRITIN, TIBC, IRON, RETICCTPCT in the last 72 hours. Urinalysis    Component Value Date/Time   COLORURINE YELLOW 01/02/2018 0449   APPEARANCEUR Clear 03/13/2021 1556   LABSPEC 1.025 01/02/2018 0449   PHURINE 6.0 01/02/2018 0449   GLUCOSEU Negative 03/13/2021 1556   HGBUR NEGATIVE 01/02/2018 0449   BILIRUBINUR Negative 03/13/2021 1556   KETONESUR negative 10/25/2019 1420   KETONESUR NEGATIVE 01/02/2018 0449   PROTEINUR Negative 03/13/2021 1556   PROTEINUR NEGATIVE 01/02/2018 0449   UROBILINOGEN 0.2 10/25/2019 1420   NITRITE Negative 03/13/2021 1556   NITRITE NEGATIVE 01/02/2018 0449   LEUKOCYTESUR Negative 03/13/2021 1556   Sepsis Labs Recent Labs  Lab 12/15/23 1807 12/16/23 0840  WBC 9.3 9.4   Microbiology Recent Results (from the past 240 hours)  Resp panel by RT-PCR (RSV, Flu A&B, Covid)  Anterior Nasal Swab     Status: None   Collection Time: 12/15/23  6:02 PM   Specimen: Anterior Nasal Swab  Result Value Ref Range Status   SARS Coronavirus 2 by RT PCR NEGATIVE NEGATIVE Final   Influenza A by PCR NEGATIVE NEGATIVE Final   Influenza B by PCR NEGATIVE NEGATIVE Final    Comment: (NOTE) The Xpert Xpress SARS-CoV-2/FLU/RSV plus assay is intended as an aid in the diagnosis of influenza from Nasopharyngeal swab specimens and should not be used as a sole basis for treatment. Nasal washings and aspirates are unacceptable for Xpert Xpress SARS-CoV-2/FLU/RSV testing.  Fact Sheet for Patients: bloggercourse.com  Fact Sheet for Healthcare Providers: seriousbroker.it  This test is not yet approved or cleared by the [place]  FDA and has been authorized for detection and/or diagnosis of SARS-CoV-2 by FDA under an Emergency Use Authorization  (EUA). This EUA will remain in effect (meaning this test can be used) for the duration of the COVID-19 declaration under Section 564(b)(1) of the Act, 21 U.S.C. section 360bbb-3(b)(1), unless the authorization is terminated or revoked.     Resp Syncytial Virus by PCR NEGATIVE NEGATIVE Final    Comment: (NOTE) Fact Sheet for Patients: bloggercourse.com  Fact Sheet for Healthcare Providers: seriousbroker.it  This test is not yet approved or cleared by the [place]  FDA and has been authorized for detection and/or diagnosis of SARS-CoV-2 by FDA under an Emergency Use Authorization (EUA). This EUA will remain in effect (meaning this test can be used) for the duration of the COVID-19 declaration under Section 564(b)(1) of the Act, 21 U.S.C. section 360bbb-3(b)(1), unless the authorization is terminated or revoked.  Performed at [facility], [address], [place], [place] [address]      Time coordinating discharge: 35 minutes  SIGNED:   Renato Applebaum, MD  [facility] 12/17/2023, 11:57 AM

## 2023-12-27 NOTE — Progress Notes (Unsigned)
 Patient name: TYRIQ MORAGNE MRN: [id number] DOB: Apr 27, 1960 Sex: male  REASON FOR VISIT: 6 month f/u for AAA  HPI: BRANDI ARMATO is a 63 y.o. male with hx HTN, HLD, CHF, tobacco abuse (cigar) who presents for 6 month follow-up for surveillance  his AAA.     This last measured 4.5 cm.  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  At the time he presented with abdominal pain and a CT was obtained in the ED that showed some stranding around the aorta and he was seen in consultation on 12/27/17.  Ultimately underwent a negative infectious work-up including negative blood cultures.  He got a repeat CT scan at 1 month follow-up after hospital discharge in 01/2018 that showed no further stranding.    Past Medical History:  Diagnosis Date   AAA (abdominal aortic aneurysm) without rupture 12/27/2017   followed by vascular-- dr c. gretta,  last duplex in epic 07-23-2020 4.6cm   Arthritis    Chronic combined systolic and diastolic CHF (congestive heart failure) (HCC) 12/2017   Hyperlipidemia    Hypertension    followed by pcp   Nocturia    OSA (obstructive sleep apnea)    pulmonology --- dr shellia----  (11-21-2020  pt stated has not used cpap in 6 months , intolerate)    Past Surgical History:  Procedure Laterality Date   COLONOSCOPY  07/26/2020   by dr stark   CYSTOSCOPY  11/26/2020   Procedure: PHYLLIS SIDE;  Surgeon: Elisabeth Valli BIRCH, MD;  Location: [facility];  Service: Urology;;   FOOT SURGERY Left 2002   fracture  (pt stated no hardware)   RADIOACTIVE SEED IMPLANT N/A 11/26/2020   Procedure: RADIOACTIVE SEED IMPLANT/BRACHYTHERAPY IMPLANT;  Surgeon: Elisabeth Valli BIRCH, MD;  Location: [facility];  Service: Urology;  Laterality: N/A;   SPACE OAR INSTILLATION N/A 11/26/2020   Procedure: SPACE OAR INSTILLATION;  Surgeon: Elisabeth Valli BIRCH, MD;  Location: [facility];  Service: Urology;   Laterality: N/A;    Family History  Problem Relation Age of Onset   Dementia Mother    Hypertension Mother    Hypertension Father    CAD Father    Colon cancer Neg Hx    Colon polyps Neg Hx    Esophageal cancer Neg Hx    Rectal cancer Neg Hx    Stomach cancer Neg Hx     SOCIAL HISTORY: Social History   Tobacco Use   Smoking status: Some Days    Current packs/day: 1.00    Average packs/day: 1 pack/day for 30.0 years (30.0 ttl pk-yrs)    Types: Cigarettes, Cigars    Passive exposure: Past   Smokeless tobacco: Never   Tobacco comments:    11-21-2020  pt stated stopped smoking cigarettes 2012 for 20 yrs ,  since then occasional cigar  Substance Use Topics   Alcohol use: Not Currently    Alcohol/week: 21.0 standard drinks of alcohol    Types: 21 Cans of beer per week    Comment: 11-21-2020 pt stated 3 beers daily (12 oz)    No Known Allergies  Current Outpatient Medications  Medication Sig Dispense Refill   atorvastatin  (LIPITOR) 40 MG tablet Take 1 tablet (40 mg total) by mouth daily. (Patient not taking: Reported on 12/16/2023) 30 tablet 0   cetirizine  (ZYRTEC ) 10 MG tablet Take 1 tablet (10 mg total) by mouth daily for 14 doses. 14 tablet  0   fluticasone  (FLONASE ) 50 MCG/ACT nasal spray Place 1 spray into both nostrils daily as needed.     guaiFENesin  (MUCINEX ) 600 MG 12 hr tablet Take 2 tablets (1,200 mg total) by mouth 2 (two) times daily for 14 days. 56 tablet 0   Olmesartan -amLODIPine -HCTZ 40-10-25 MG TABS Take 1 tablet by mouth daily. 90 tablet 1   spironolactone  (ALDACTONE ) 25 MG tablet Take 1 tablet (25 mg total) by mouth daily. 90 tablet 3   tamsulosin  (FLOMAX ) 0.4 MG CAPS capsule Take 0.4 mg by mouth daily. (Patient not taking: Reported on 12/16/2023)     thiamine  (VITAMIN B-1) 100 MG tablet Take 100 mg by mouth daily. (Patient not taking: Reported on 12/16/2023)     No current facility-administered medications for this visit.    REVIEW OF SYSTEMS:  [X]  denotes  positive finding, [ ]  denotes negative finding Cardiac  Comments:  Chest pain or chest pressure:    Shortness of breath upon exertion:    Short of breath when lying flat:    Irregular heart rhythm:        Vascular    Pain in calf, thigh, or hip brought on by ambulation:    Pain in feet at night that wakes you up from your sleep:     Blood clot in your veins:    Leg swelling:         Pulmonary    Oxygen at home:    Productive cough:     Wheezing:         Neurologic    Sudden weakness in arms or legs:     Sudden numbness in arms or legs:     Sudden onset of difficulty speaking or slurred speech:    Temporary loss of vision in one eye:     Problems with dizziness:         Gastrointestinal    Blood in stool:     Vomited blood:         Genitourinary    Burning when urinating:     Blood in urine:        Psychiatric    Major depression:         Hematologic    Bleeding problems:    Problems with blood clotting too easily:        Skin    Rashes or ulcers:        Constitutional    Fever or chills:      PHYSICAL EXAM: There were no vitals filed for this visit.   GENERAL: The patient is a well-nourished male, in no acute distress. The vital signs are documented above. CARDIAC: There is a regular rate and rhythm.  VASCULAR:  2+ femoral pulse palpable bilateral groins 2+ dorsalis pedis pulses palpable bilateral lower extremities PULMONARY: No respiratory distress. ABDOMEN: Soft and non-tender.  No pain with deep palpation MUSCULOSKELETAL: There are no major deformities or cyanosis. NEUROLOGIC: No focal weakness or paresthesias are detected.   DATA:   AAA duplex today measures   CTA reviewed 06/21/2023 with 4.5 cm descending thoracic/visceral abdominal aneurysm  Assessment/Plan:  63 year old male presents for 6 month follow-up of his known aneurysm.  Previously underwent workup for aortitis with stranding around his aorta in 2019.  On recent CTA from 06/21/2023 the  maximal diameter of his distal thoracic and visceral aorta is 4.5 cm.  Based on duplex today   I discussed no indication for repair.  Can continue surveillance.  I discussed we  repair these greater than 5.5 cm.    Lonni DOROTHA Gaskins, MD [facility] Office: [phone]

## 2023-12-28 ENCOUNTER — Encounter: Payer: Self-pay | Admitting: Vascular Surgery

## 2023-12-28 ENCOUNTER — Ambulatory Visit: Admitting: Vascular Surgery

## 2023-12-28 ENCOUNTER — Ambulatory Visit (HOSPITAL_COMMUNITY): Admission: RE | Admit: 2023-12-28 | Discharge: 2023-12-28 | Attending: Vascular Surgery

## 2023-12-28 VITALS — BP 145/97 | HR 75 | Temp 98.0°F | Resp 18 | Ht 69.0 in | Wt 176.0 lb

## 2023-12-28 DIAGNOSIS — I7143 Infrarenal abdominal aortic aneurysm, without rupture: Secondary | ICD-10-CM

## 2023-12-29 ENCOUNTER — Other Ambulatory Visit: Payer: Self-pay

## 2023-12-29 DIAGNOSIS — I714 Abdominal aortic aneurysm, without rupture, unspecified: Secondary | ICD-10-CM

## 2024-07-04 ENCOUNTER — Ambulatory Visit: Admitting: Vascular Surgery

## 2024-07-04 ENCOUNTER — Ambulatory Visit (HOSPITAL_COMMUNITY)
